# Patient Record
Sex: Female | Born: 1935 | Race: White | Hispanic: No | Smoking: Never smoker
Health system: Southern US, Community
[De-identification: ages and names within clinical notes are randomized; demographics above are authoritative.]

## PROBLEM LIST (undated history)

## (undated) DIAGNOSIS — J189 Pneumonia, unspecified organism: Secondary | ICD-10-CM

## (undated) DIAGNOSIS — J449 Chronic obstructive pulmonary disease, unspecified: Secondary | ICD-10-CM

## (undated) DIAGNOSIS — C50919 Malignant neoplasm of unspecified site of unspecified female breast: Secondary | ICD-10-CM

## (undated) DIAGNOSIS — Z923 Personal history of irradiation: Secondary | ICD-10-CM

## (undated) DIAGNOSIS — C801 Malignant (primary) neoplasm, unspecified: Secondary | ICD-10-CM

## (undated) DIAGNOSIS — I7090 Unspecified atherosclerosis: Secondary | ICD-10-CM

## (undated) DIAGNOSIS — Z9289 Personal history of other medical treatment: Secondary | ICD-10-CM

## (undated) DIAGNOSIS — Z9889 Other specified postprocedural states: Secondary | ICD-10-CM

## (undated) DIAGNOSIS — I38 Endocarditis, valve unspecified: Secondary | ICD-10-CM

## (undated) DIAGNOSIS — M1990 Unspecified osteoarthritis, unspecified site: Secondary | ICD-10-CM

## (undated) HISTORY — DX: Endocarditis, valve unspecified: I38

## (undated) HISTORY — DX: Malignant (primary) neoplasm, unspecified: C80.1

## (undated) HISTORY — DX: Personal history of other medical treatment: Z92.89

## (undated) HISTORY — DX: Unspecified atherosclerosis: I70.90

## (undated) HISTORY — DX: Other specified postprocedural states: Z98.890

## (undated) HISTORY — PX: BREAST BIOPSY: SHX20

## (undated) HISTORY — DX: Chronic obstructive pulmonary disease, unspecified: J44.9

## (undated) HISTORY — PX: COLONOSCOPY: SHX174

## (undated) HISTORY — DX: Pneumonia, unspecified organism: J18.9

## (undated) HISTORY — DX: Unspecified osteoarthritis, unspecified site: M19.90

---

## 1972-02-23 HISTORY — PX: CHOLECYSTECTOMY: SHX55

## 1991-05-24 DIAGNOSIS — Z923 Personal history of irradiation: Secondary | ICD-10-CM

## 1991-05-24 HISTORY — PX: BREAST LUMPECTOMY: SHX2

## 1991-05-24 HISTORY — DX: Personal history of irradiation: Z92.3

## 1991-11-11 DIAGNOSIS — C801 Malignant (primary) neoplasm, unspecified: Secondary | ICD-10-CM

## 1991-11-11 HISTORY — PX: BREAST SURGERY: SHX581

## 1991-11-11 HISTORY — DX: Malignant (primary) neoplasm, unspecified: C80.1

## 2004-05-12 ENCOUNTER — Ambulatory Visit: Payer: Self-pay | Admitting: Internal Medicine

## 2004-05-31 ENCOUNTER — Ambulatory Visit: Payer: Self-pay | Admitting: Internal Medicine

## 2004-08-11 ENCOUNTER — Emergency Department: Payer: Self-pay | Admitting: General Practice

## 2004-08-30 ENCOUNTER — Ambulatory Visit: Payer: Self-pay | Admitting: Internal Medicine

## 2004-12-10 ENCOUNTER — Ambulatory Visit: Payer: Self-pay | Admitting: Internal Medicine

## 2005-06-01 ENCOUNTER — Ambulatory Visit: Payer: Self-pay | Admitting: Internal Medicine

## 2005-10-03 ENCOUNTER — Ambulatory Visit: Payer: Self-pay | Admitting: Unknown Physician Specialty

## 2006-04-28 ENCOUNTER — Ambulatory Visit: Payer: Self-pay | Admitting: Internal Medicine

## 2006-06-20 ENCOUNTER — Ambulatory Visit: Payer: Self-pay | Admitting: Internal Medicine

## 2006-07-14 ENCOUNTER — Emergency Department: Payer: Self-pay | Admitting: Internal Medicine

## 2006-07-14 ENCOUNTER — Other Ambulatory Visit: Payer: Self-pay

## 2006-11-02 ENCOUNTER — Ambulatory Visit: Payer: Self-pay | Admitting: Ophthalmology

## 2006-11-07 ENCOUNTER — Ambulatory Visit: Payer: Self-pay | Admitting: Ophthalmology

## 2006-12-20 ENCOUNTER — Ambulatory Visit: Payer: Self-pay | Admitting: Unknown Physician Specialty

## 2007-07-09 ENCOUNTER — Ambulatory Visit: Payer: Self-pay | Admitting: Internal Medicine

## 2008-01-08 ENCOUNTER — Other Ambulatory Visit: Payer: Self-pay

## 2008-01-08 ENCOUNTER — Ambulatory Visit: Payer: Self-pay | Admitting: Ophthalmology

## 2008-01-22 ENCOUNTER — Ambulatory Visit: Payer: Self-pay | Admitting: Ophthalmology

## 2008-07-10 ENCOUNTER — Ambulatory Visit: Payer: Self-pay | Admitting: Internal Medicine

## 2009-07-14 ENCOUNTER — Ambulatory Visit: Payer: Self-pay | Admitting: Internal Medicine

## 2009-11-11 ENCOUNTER — Ambulatory Visit: Payer: Self-pay | Admitting: Unknown Physician Specialty

## 2010-05-22 ENCOUNTER — Emergency Department: Payer: Self-pay | Admitting: Emergency Medicine

## 2010-05-23 DIAGNOSIS — Z9889 Other specified postprocedural states: Secondary | ICD-10-CM

## 2010-05-23 HISTORY — DX: Other specified postprocedural states: Z98.890

## 2010-07-15 ENCOUNTER — Ambulatory Visit: Payer: Self-pay | Admitting: Internal Medicine

## 2011-07-25 ENCOUNTER — Ambulatory Visit: Payer: Self-pay | Admitting: Family Medicine

## 2012-02-23 ENCOUNTER — Ambulatory Visit: Payer: Self-pay | Admitting: Orthopedic Surgery

## 2012-02-27 ENCOUNTER — Ambulatory Visit: Payer: Self-pay | Admitting: Orthopedic Surgery

## 2012-02-27 DIAGNOSIS — I10 Essential (primary) hypertension: Secondary | ICD-10-CM

## 2012-02-27 LAB — BASIC METABOLIC PANEL
Anion Gap: 11 (ref 7–16)
BUN: 17 mg/dL (ref 7–18)
Calcium, Total: 8.9 mg/dL (ref 8.5–10.1)
Chloride: 95 mmol/L — ABNORMAL LOW (ref 98–107)
Co2: 27 mmol/L (ref 21–32)
EGFR (Non-African Amer.): 34 — ABNORMAL LOW
Glucose: 122 mg/dL — ABNORMAL HIGH (ref 65–99)
Potassium: 3.4 mmol/L — ABNORMAL LOW (ref 3.5–5.1)

## 2012-03-01 ENCOUNTER — Ambulatory Visit: Payer: Self-pay | Admitting: Orthopedic Surgery

## 2012-03-07 LAB — PATHOLOGY REPORT

## 2012-07-26 ENCOUNTER — Ambulatory Visit: Payer: Self-pay | Admitting: Family Medicine

## 2012-08-06 ENCOUNTER — Ambulatory Visit: Payer: Self-pay | Admitting: Family Medicine

## 2012-11-12 ENCOUNTER — Inpatient Hospital Stay: Payer: Self-pay | Admitting: Family Medicine

## 2012-11-12 LAB — URINALYSIS, COMPLETE
Glucose,UR: NEGATIVE mg/dL (ref 0–75)
Nitrite: NEGATIVE
Ph: 6 (ref 4.5–8.0)
RBC,UR: 9 /HPF (ref 0–5)
Specific Gravity: 1.026 (ref 1.003–1.030)
Squamous Epithelial: 5

## 2012-11-12 LAB — CBC WITH DIFFERENTIAL/PLATELET
Basophil #: 0.1 10*3/uL (ref 0.0–0.1)
HCT: 34.2 % — ABNORMAL LOW (ref 35.0–47.0)
Lymphocyte #: 1.3 10*3/uL (ref 1.0–3.6)
Lymphocyte %: 7.5 %
MCHC: 33.3 g/dL (ref 32.0–36.0)
MCV: 85 fL (ref 80–100)
Monocyte %: 14.4 %
Platelet: 338 10*3/uL (ref 150–440)
RBC: 4.01 10*6/uL (ref 3.80–5.20)

## 2012-11-12 LAB — COMPREHENSIVE METABOLIC PANEL
Albumin: 3.2 g/dL — ABNORMAL LOW (ref 3.4–5.0)
Anion Gap: 9 (ref 7–16)
Bilirubin,Total: 0.7 mg/dL (ref 0.2–1.0)
Calcium, Total: 9.2 mg/dL (ref 8.5–10.1)
Creatinine: 1.01 mg/dL (ref 0.60–1.30)
Glucose: 116 mg/dL — ABNORMAL HIGH (ref 65–99)
Osmolality: 261 (ref 275–301)
Potassium: 3.7 mmol/L (ref 3.5–5.1)
SGOT(AST): 13 U/L — ABNORMAL LOW (ref 15–37)
Sodium: 128 mmol/L — ABNORMAL LOW (ref 136–145)
Total Protein: 8.3 g/dL — ABNORMAL HIGH (ref 6.4–8.2)

## 2012-11-13 LAB — CBC WITH DIFFERENTIAL/PLATELET
Bands: 2 %
HCT: 28.8 % — ABNORMAL LOW (ref 35.0–47.0)
HGB: 10 g/dL — ABNORMAL LOW (ref 12.0–16.0)
MCH: 29.5 pg (ref 26.0–34.0)
Platelet: 285 10*3/uL (ref 150–440)
RDW: 17 % — ABNORMAL HIGH (ref 11.5–14.5)
Segmented Neutrophils: 75 %
WBC: 16.6 10*3/uL — ABNORMAL HIGH (ref 3.6–11.0)

## 2012-11-13 LAB — BASIC METABOLIC PANEL
Calcium, Total: 8.2 mg/dL — ABNORMAL LOW (ref 8.5–10.1)
Chloride: 93 mmol/L — ABNORMAL LOW (ref 98–107)
Osmolality: 260 (ref 275–301)

## 2012-11-13 LAB — MAGNESIUM: Magnesium: 1.4 mg/dL — ABNORMAL LOW

## 2012-11-14 LAB — CBC WITH DIFFERENTIAL/PLATELET
Basophil #: 0.1 10*3/uL (ref 0.0–0.1)
Basophil %: 0.5 %
Eosinophil %: 1.4 %
HCT: 28.2 % — ABNORMAL LOW (ref 35.0–47.0)
HGB: 9.6 g/dL — ABNORMAL LOW (ref 12.0–16.0)
Lymphocyte #: 1.1 10*3/uL (ref 1.0–3.6)
MCH: 28.8 pg (ref 26.0–34.0)
MCV: 85 fL (ref 80–100)
Neutrophil #: 10.8 10*3/uL — ABNORMAL HIGH (ref 1.4–6.5)
Neutrophil %: 74.7 %
Platelet: 293 10*3/uL (ref 150–440)
RDW: 17.3 % — ABNORMAL HIGH (ref 11.5–14.5)
WBC: 14.5 10*3/uL — ABNORMAL HIGH (ref 3.6–11.0)

## 2012-11-14 LAB — BASIC METABOLIC PANEL
BUN: 9 mg/dL (ref 7–18)
Calcium, Total: 8.2 mg/dL — ABNORMAL LOW (ref 8.5–10.1)
Co2: 26 mmol/L (ref 21–32)
Creatinine: 0.71 mg/dL (ref 0.60–1.30)
EGFR (Non-African Amer.): >60
Glucose: 102 mg/dL — ABNORMAL HIGH (ref 65–99)
Osmolality: 258 (ref 275–301)
Potassium: 3.6 mmol/L (ref 3.5–5.1)
Sodium: 129 mmol/L — ABNORMAL LOW (ref 136–145)

## 2012-11-15 LAB — CBC WITH DIFFERENTIAL/PLATELET
Basophil #: 0.1 10*3/uL (ref 0.0–0.1)
Basophil %: 0.4 %
Eosinophil #: 0.1 10*3/uL (ref 0.0–0.7)
Eosinophil %: 0.9 %
HGB: 9.5 g/dL — ABNORMAL LOW (ref 12.0–16.0)
Lymphocyte #: 1 10*3/uL (ref 1.0–3.6)
Lymphocyte %: 7.3 %
MCH: 28.9 pg (ref 26.0–34.0)
MCV: 84 fL (ref 80–100)
Monocyte #: 1.9 x10 3/mm — ABNORMAL HIGH (ref 0.2–0.9)
Neutrophil #: 11.1 10*3/uL — ABNORMAL HIGH (ref 1.4–6.5)
RDW: 16.9 % — ABNORMAL HIGH (ref 11.5–14.5)
WBC: 14.3 10*3/uL — ABNORMAL HIGH (ref 3.6–11.0)

## 2012-11-15 LAB — BASIC METABOLIC PANEL
Anion Gap: 5 — ABNORMAL LOW (ref 7–16)
BUN: 9 mg/dL (ref 7–18)
Co2: 30 mmol/L (ref 21–32)
Creatinine: 0.76 mg/dL (ref 0.60–1.30)
EGFR (African American): >60
Glucose: 115 mg/dL — ABNORMAL HIGH (ref 65–99)
Osmolality: 260 (ref 275–301)
Potassium: 3.6 mmol/L (ref 3.5–5.1)
Sodium: 130 mmol/L — ABNORMAL LOW (ref 136–145)

## 2012-11-15 LAB — EXPECTORATED SPUTUM ASSESSMENT W GRAM STAIN, RFLX TO RESP C

## 2012-11-16 LAB — CBC WITH DIFFERENTIAL/PLATELET
Basophil #: 0.1 10*3/uL (ref 0.0–0.1)
HCT: 27.9 % — ABNORMAL LOW (ref 35.0–47.0)
Lymphocyte #: 1 10*3/uL (ref 1.0–3.6)
MCV: 85 fL (ref 80–100)
Monocyte #: 1.8 x10 3/mm — ABNORMAL HIGH (ref 0.2–0.9)
Monocyte %: 14.9 %
Neutrophil #: 9 10*3/uL — ABNORMAL HIGH (ref 1.4–6.5)
Neutrophil %: 74.6 %
Platelet: 351 10*3/uL (ref 150–440)
RBC: 3.31 10*6/uL — ABNORMAL LOW (ref 3.80–5.20)
RDW: 17 % — ABNORMAL HIGH (ref 11.5–14.5)

## 2012-11-18 LAB — CULTURE, BLOOD (SINGLE)

## 2012-11-20 DIAGNOSIS — J189 Pneumonia, unspecified organism: Secondary | ICD-10-CM

## 2012-11-20 HISTORY — DX: Pneumonia, unspecified organism: J18.9

## 2013-07-30 ENCOUNTER — Ambulatory Visit: Payer: Self-pay | Admitting: Family Medicine

## 2013-08-28 DIAGNOSIS — I10 Essential (primary) hypertension: Secondary | ICD-10-CM | POA: Insufficient documentation

## 2013-08-28 DIAGNOSIS — E7800 Pure hypercholesterolemia, unspecified: Secondary | ICD-10-CM | POA: Insufficient documentation

## 2013-08-28 DIAGNOSIS — D649 Anemia, unspecified: Secondary | ICD-10-CM | POA: Insufficient documentation

## 2013-08-28 DIAGNOSIS — E785 Hyperlipidemia, unspecified: Secondary | ICD-10-CM | POA: Insufficient documentation

## 2013-08-28 DIAGNOSIS — D509 Iron deficiency anemia, unspecified: Secondary | ICD-10-CM | POA: Insufficient documentation

## 2013-08-28 DIAGNOSIS — D126 Benign neoplasm of colon, unspecified: Secondary | ICD-10-CM | POA: Insufficient documentation

## 2013-12-01 IMAGING — MG MM ADDITIONAL VIEWS AT NO CHARGE
1 series · 3 of 3 positions shown · non-contrast
Comparison: none

REASON FOR EXAM: av lt spiculated asymmetry rt nodular asymmetry
COMMENTS:

PROCEDURE:     MAM - MAM DGTL [HOSPITAL] ADD VIEW BIL DIAG  - August 06, 2012  [DATE]
RESULT:     TECHNIQUE: Digital diagnostic bilateral mammograms were
obtained. FDA approved computer-aided detection (CAD) for mammography was
utilized for this study.

[R MLO · right · 3 of 3 slices shown]
[im 1/3]
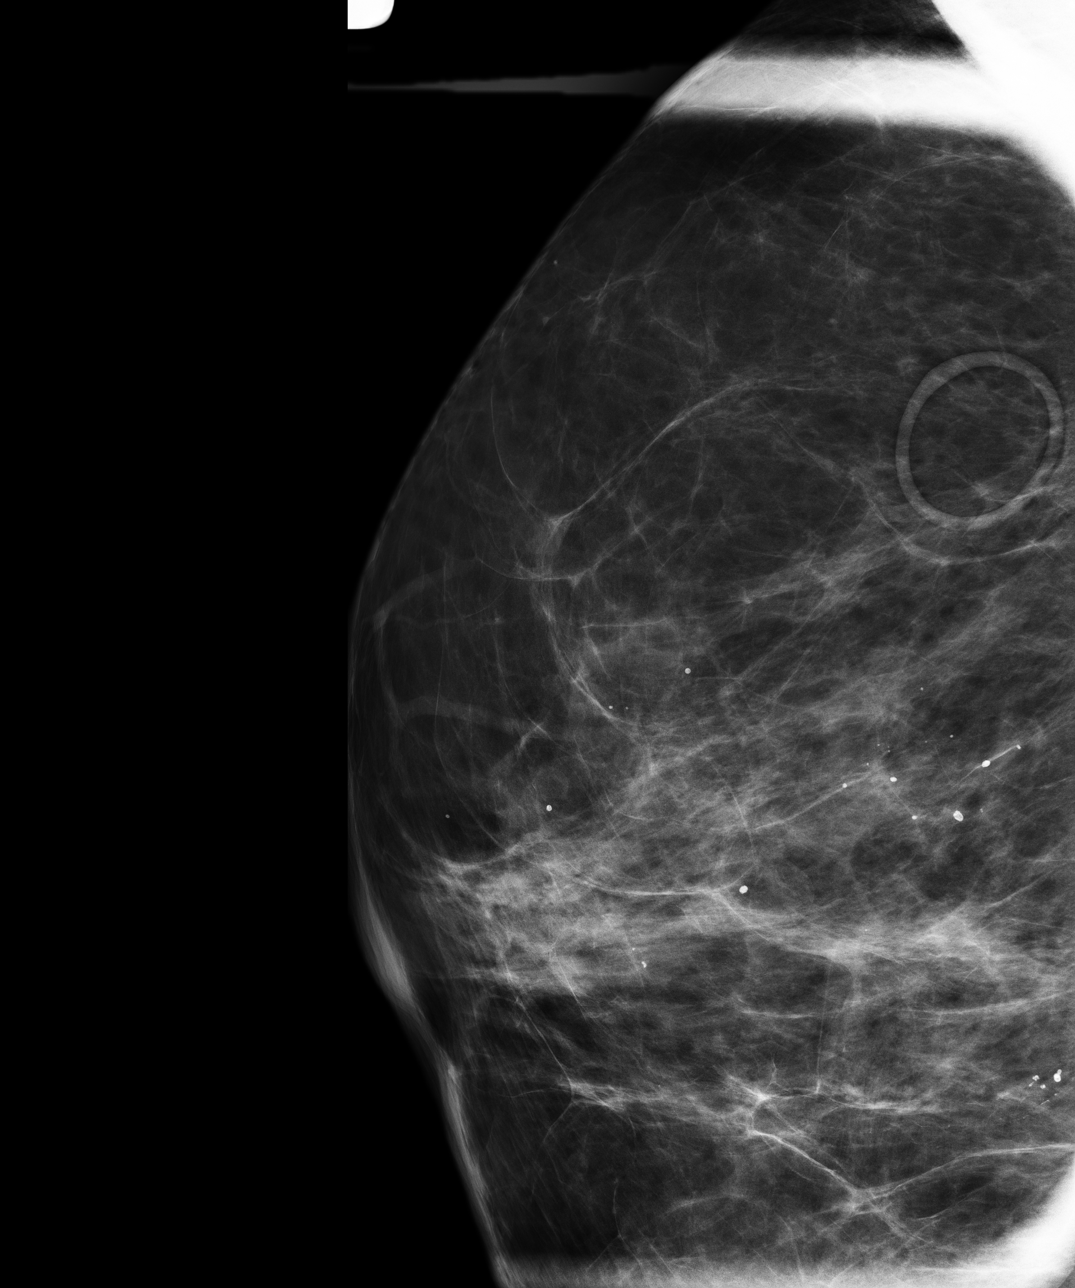
[im 2/3]
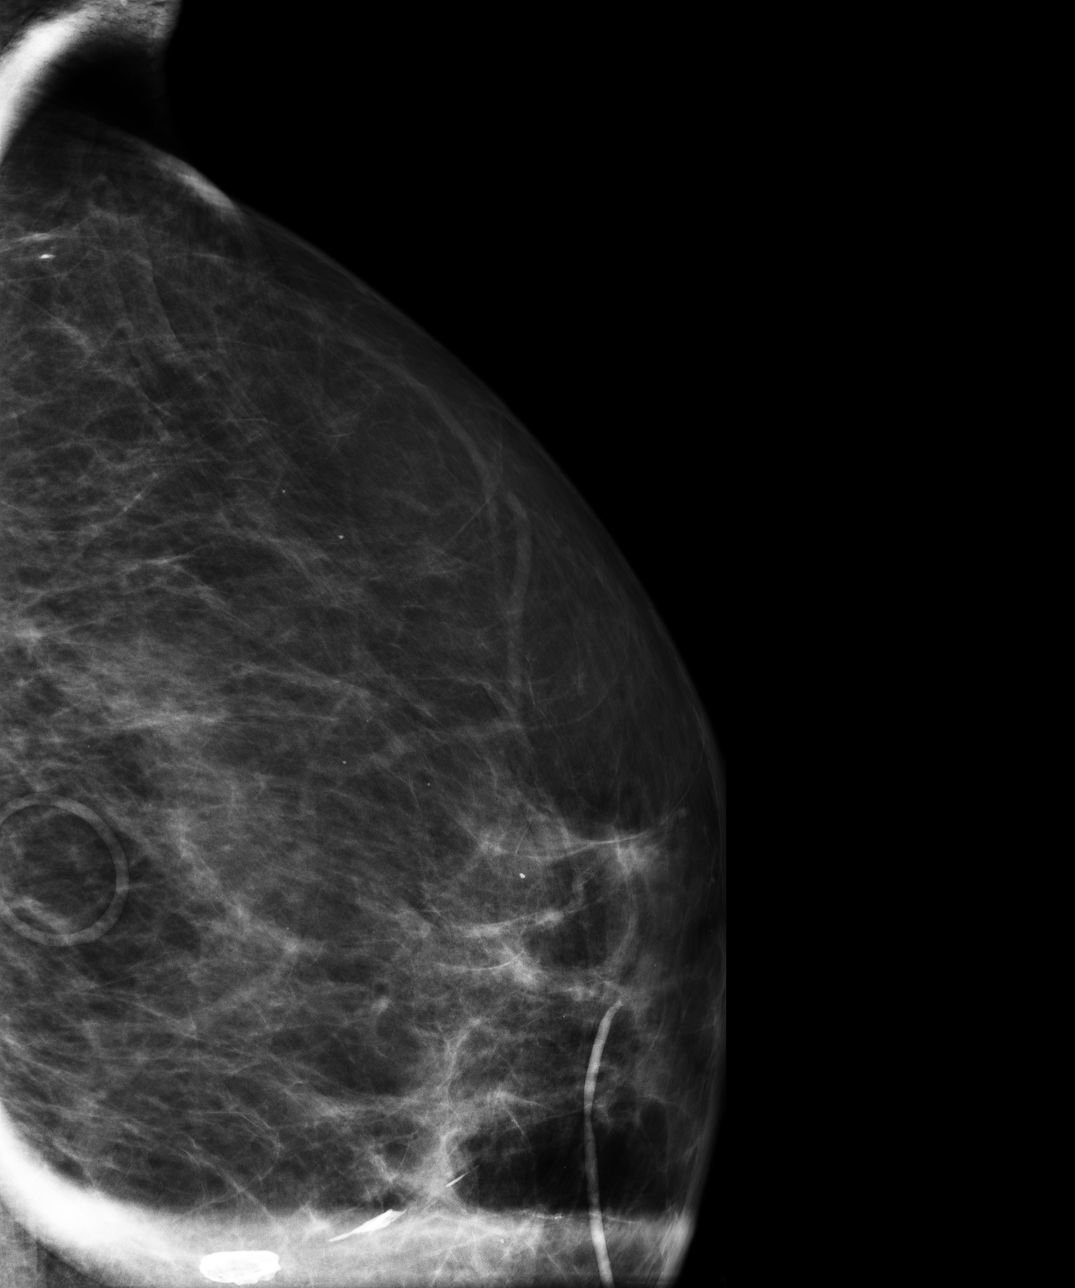
[im 3/3]
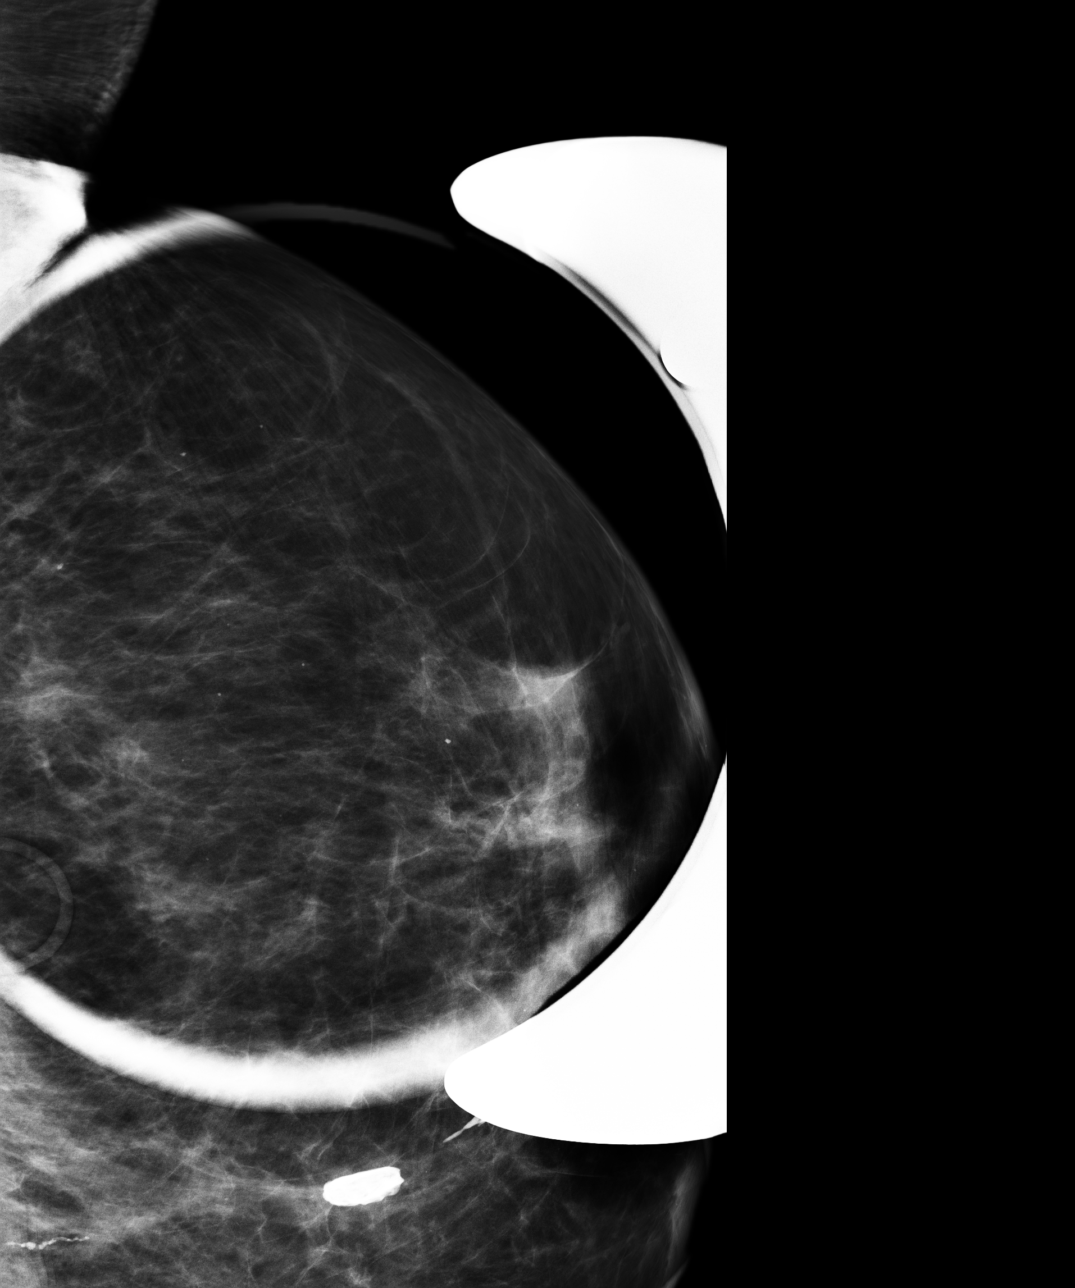

[3 of 3 positions shown; findings below may reference images not displayed]

FINDING: True lateral view and spot compression views of the right breast were
performed. The area of concern in the retroareolar right breast compresses
and blends in with the adjacent fibroglandular tissue without a persistent
mass or architectural distortion. There are no suspicious
microcalcifications.

True lateral view and spot compression views of the left breast were
performed. The area of concern in the left upper breast compresses and
blends in with the adjacent fibroglandular tissue without a persistent mass
or architectural distortion. There are no suspicious microcalcifications.
IMPRESSION: 1.     Negative diagnostic bilateral mammograms.
2.     Return to annual mammographic follow up recommended.

BI-RADS:  Category 2- Benign.

A negative mammogram report does not preclude biopsy or other evaluation of
a clinically palpable or otherwise suspicious mass or lesion. Breast cancer
may not be detected by mammography in up to 10% of cases.

[REDACTED]

## 2014-03-09 IMAGING — CR DG CHEST 2V
1 series · 2 of 2 positions shown · non-contrast
Comparison: none

REASON FOR EXAM: cough
COMMENTS:

PROCEDURE:     DXR - DXR CHEST PA (OR AP) AND LATERAL  - November 12, 2012  [DATE]
RESULT:     Comparison: 05/22/2010

[Series 1: w chest pa · 0.14mm/px · 2 of 2 slices shown]
[im 1/2]
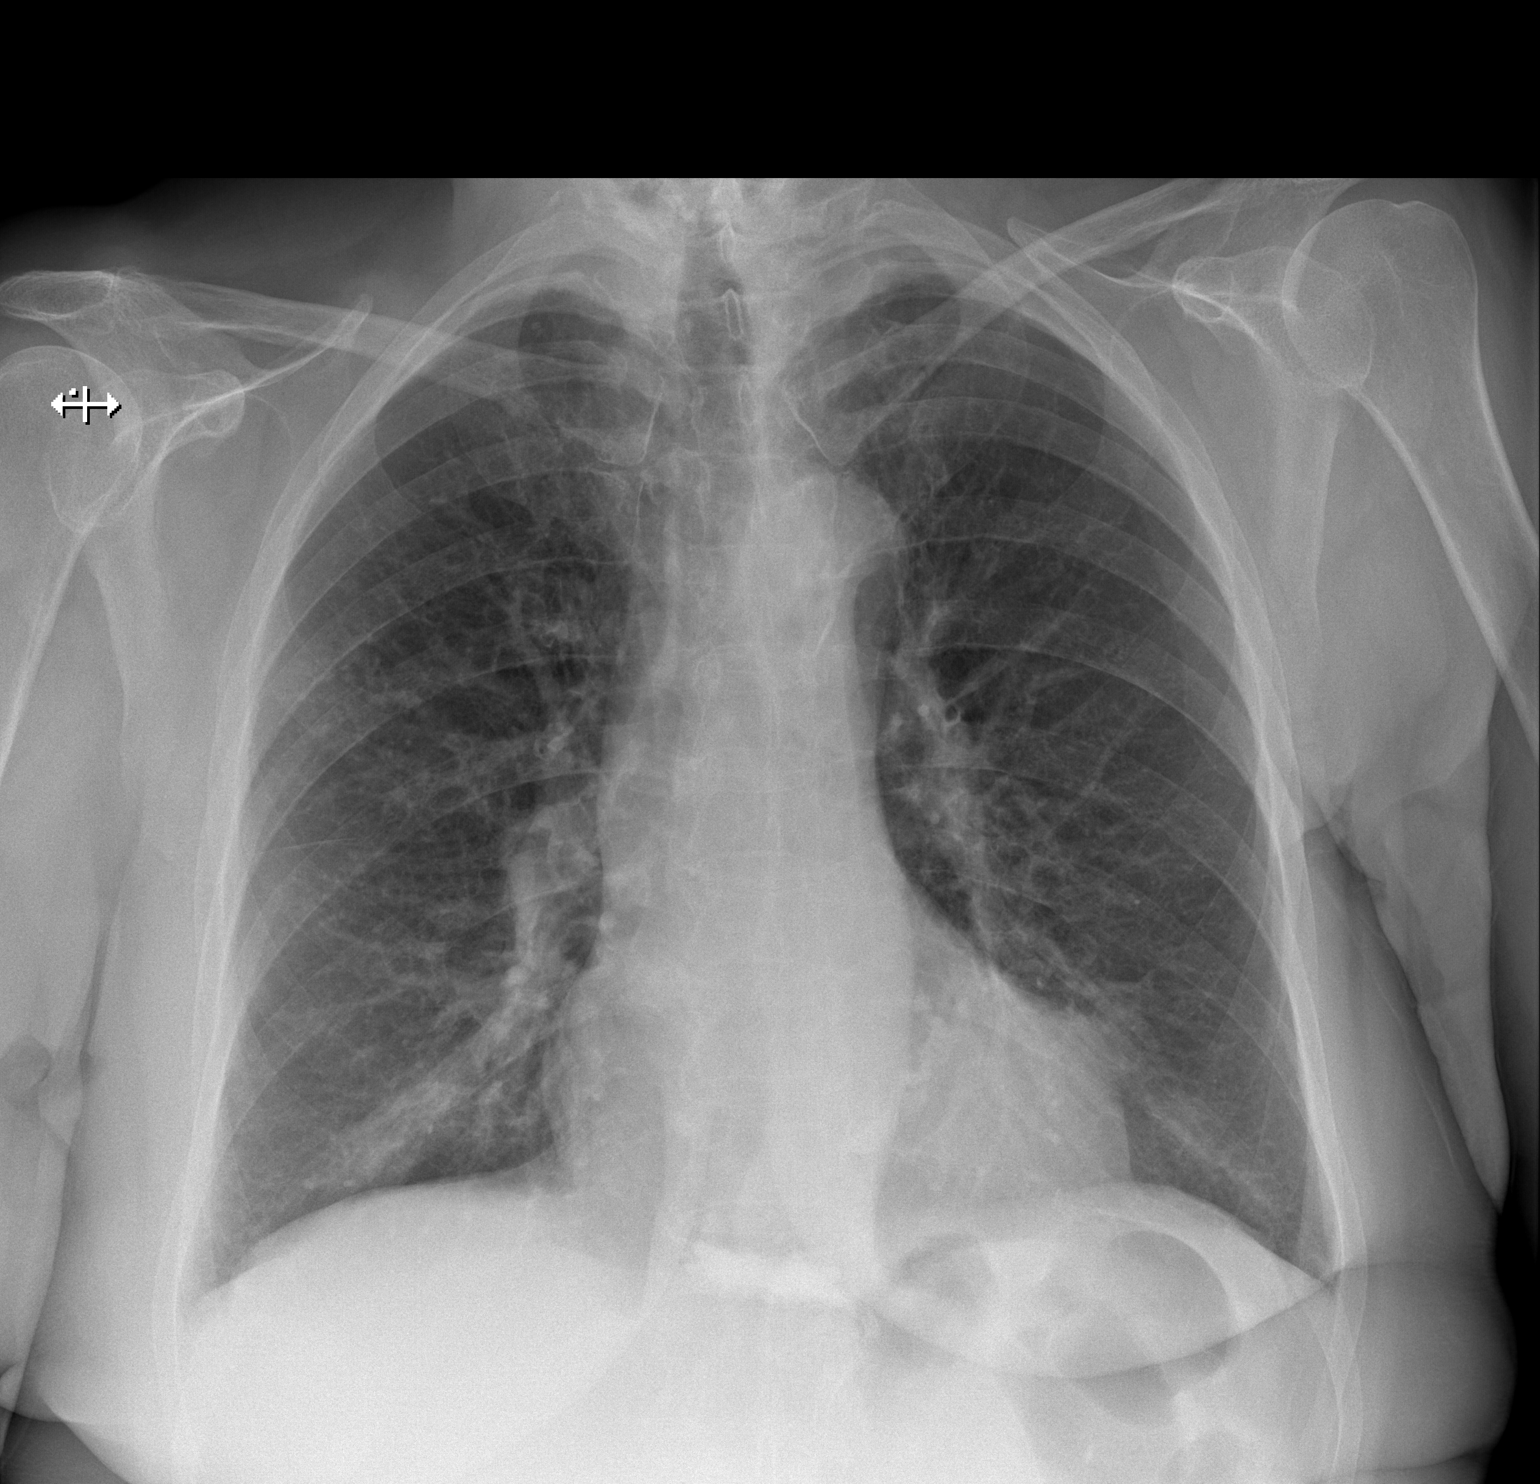
[im 2/2]
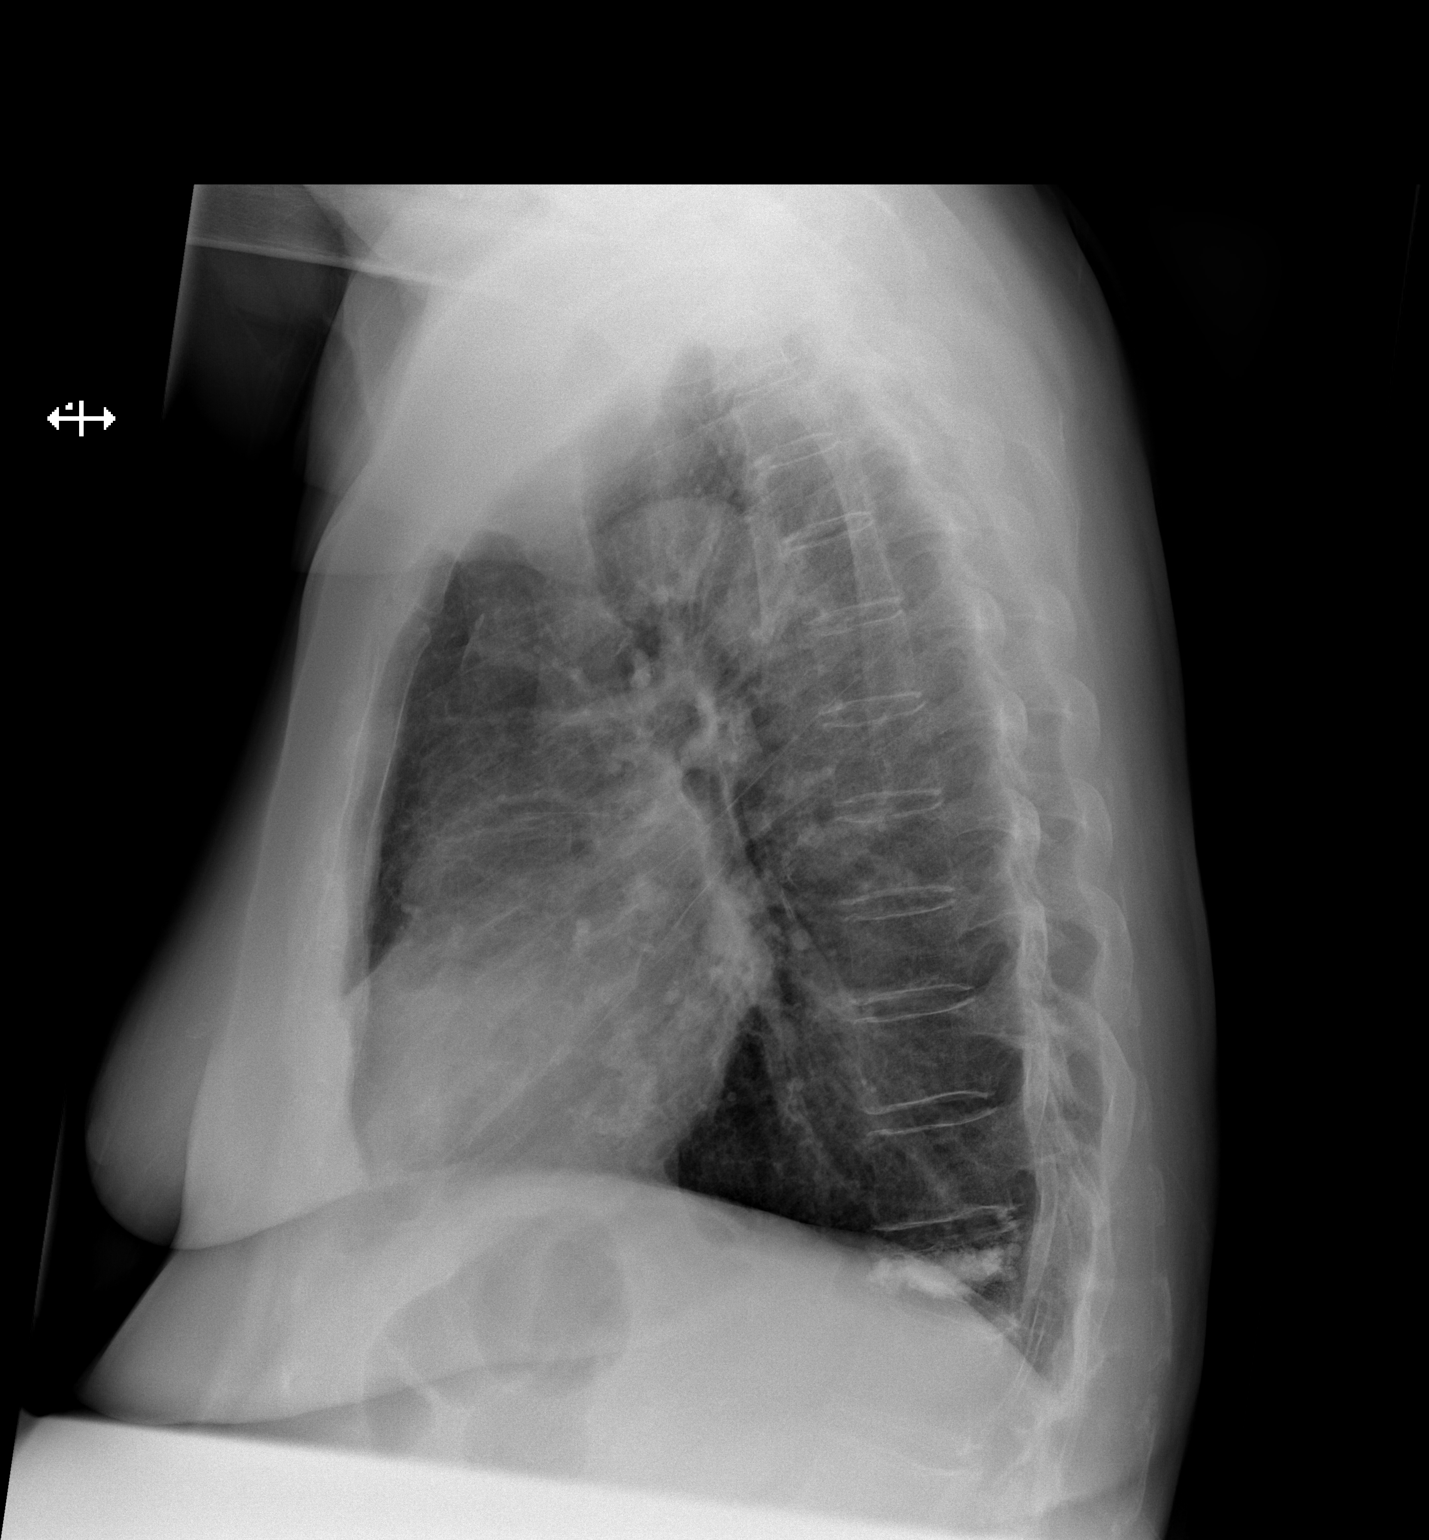

[2 of 2 positions shown; findings below may reference images not displayed]

FINDINGS: PA and lateral chest radiographs are provided. There is bilateral diffuse
interstitial thickening likely representing interstitial edema versus
interstitial pneumonitis secondary to an infectious or inflammatory
etiology. There is no focal parenchymal opacity, pleural effusion, or
pneumothorax. The heart and mediastinum are unremarkable. There is evidence
of prior lower thoracic spine kyphoplasty.
IMPRESSION: There is bilateral diffuse interstitial thickening likely representing
interstitial edema versus interstitial pneumonitis secondary to an
infectious or inflammatory etiology.

[REDACTED]

## 2014-03-11 IMAGING — CR DG CHEST 2V
1 series · 2 of 2 positions shown · non-contrast
Comparison: none

REASON FOR EXAM: fever, green sputum
COMMENTS:

PROCEDURE:     DXR - DXR CHEST PA (OR AP) AND LATERAL  - November 14, 2012  [DATE]
RESULT:     Comparison is made to prior study dated 11/12/2012.

[Series 3: x chest ap · 0.14mm/px · 2 of 2 slices shown]
[im 1/2]
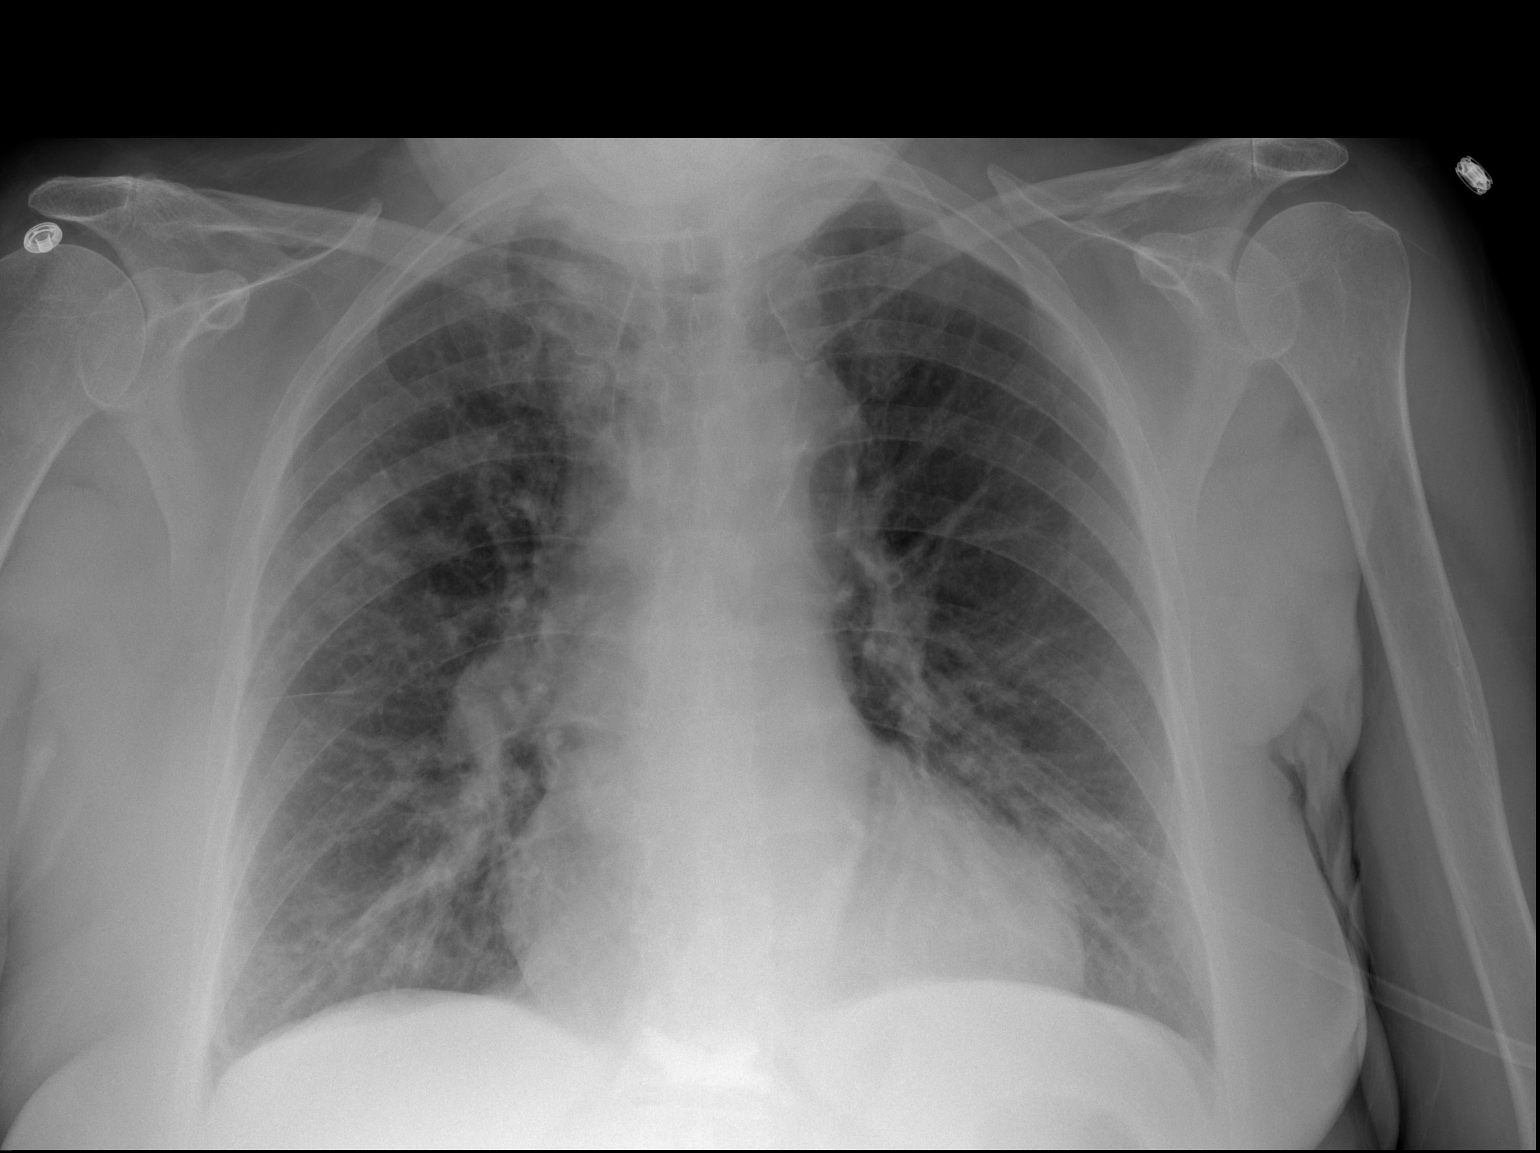
[im 2/2]
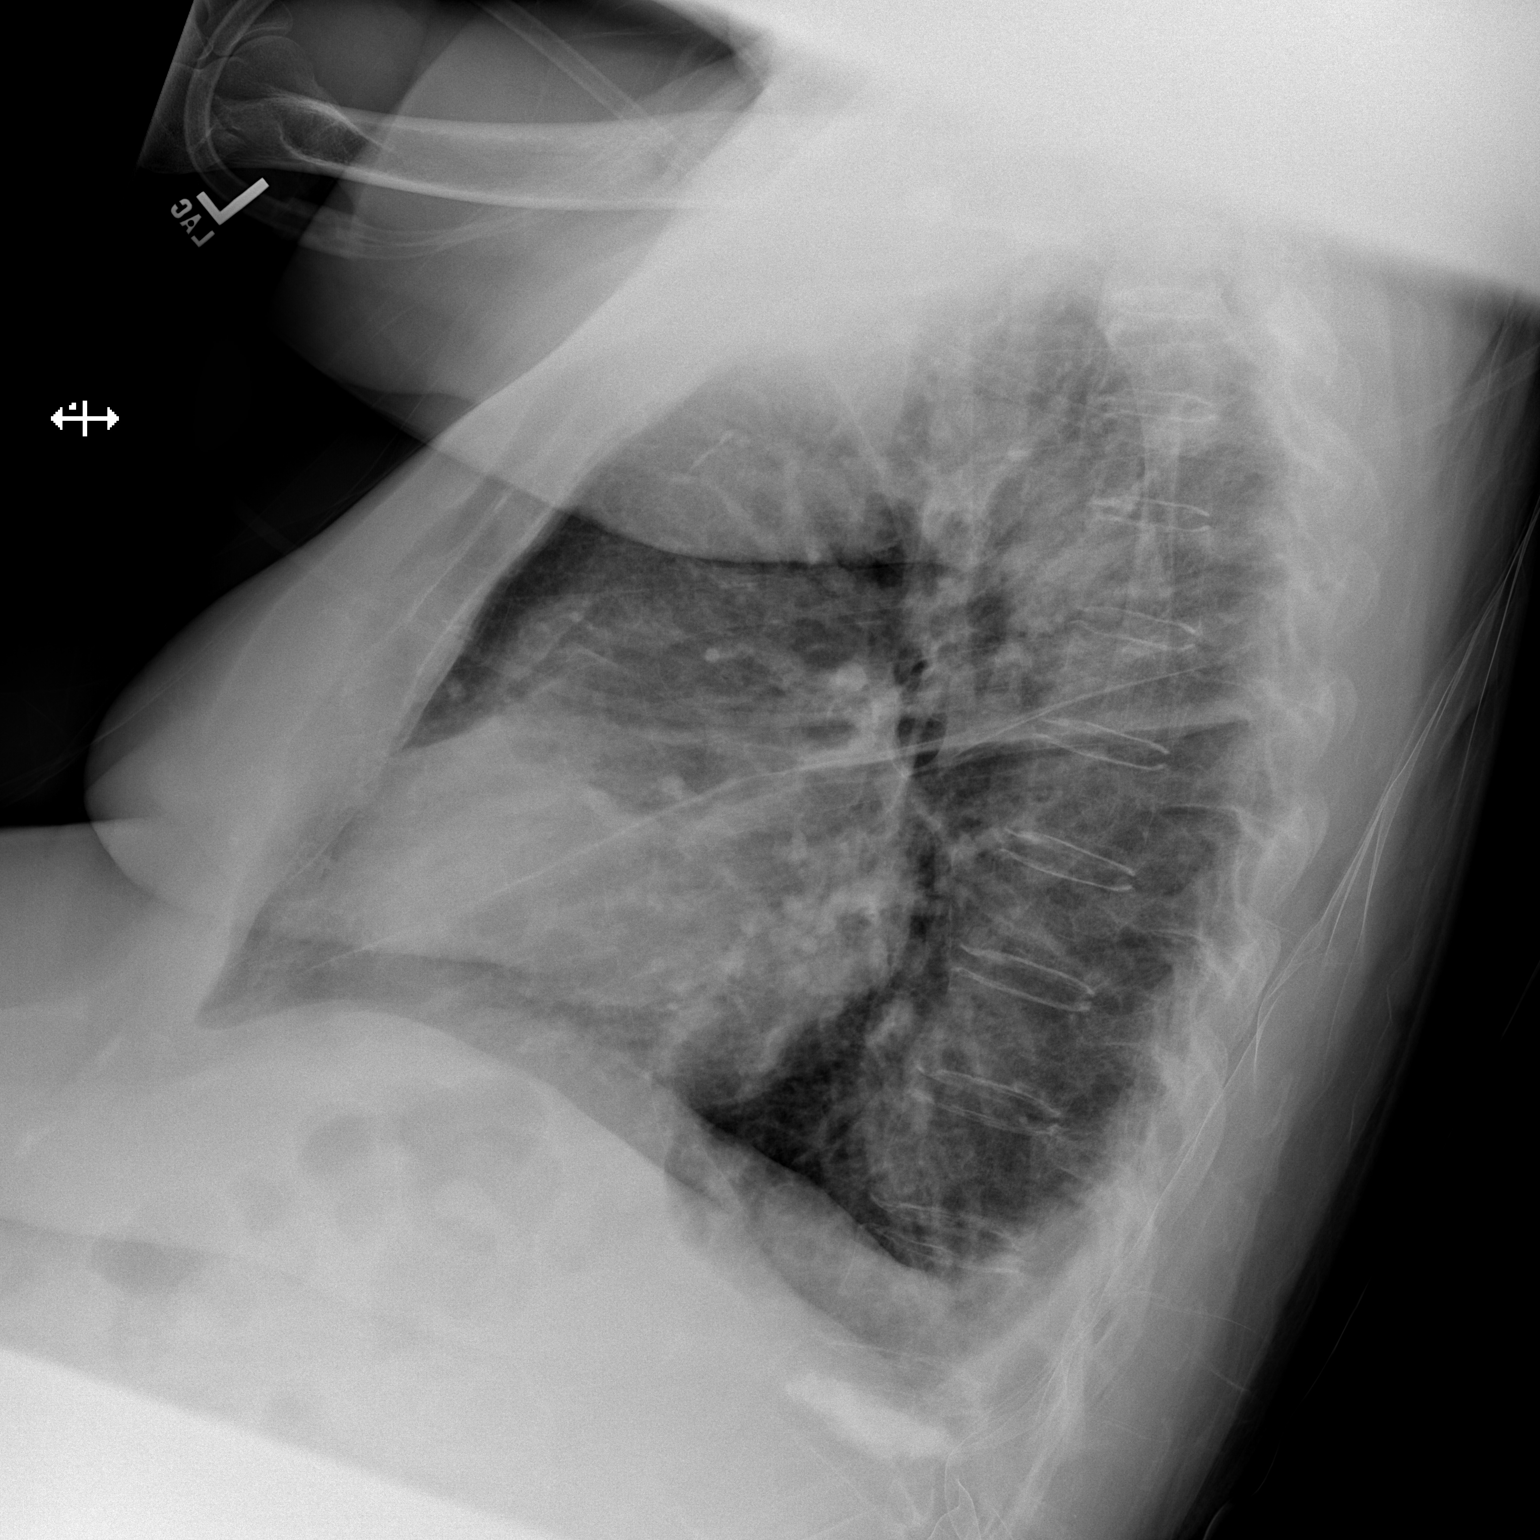

[2 of 2 positions shown; findings below may reference images not displayed]

FINDINGS: There is diffuse prominence and indistinctness of the interstitial
markings. No significant peribronchial cuffing is identified. Bilateral
pulmonary opacities, mild, are identified. The cardiac silhouette is within
normal limits. The visualized bony skeleton is unremarkable.
IMPRESSION: 1. Bilateral interstitial infiltrate likely representing an infectious or
inflammatory infiltrate. An underlying component of pulmonary edema is also
of diagnostic consideration if clinically appropriate. Surveillance
evaluation recommended status post appropriate therapeutic regimen.

## 2014-03-31 DIAGNOSIS — I350 Nonrheumatic aortic (valve) stenosis: Secondary | ICD-10-CM | POA: Insufficient documentation

## 2014-05-23 DIAGNOSIS — Z9289 Personal history of other medical treatment: Secondary | ICD-10-CM

## 2014-05-23 HISTORY — PX: BREAST LUMPECTOMY: SHX2

## 2014-05-23 HISTORY — DX: Personal history of other medical treatment: Z92.89

## 2014-08-04 ENCOUNTER — Ambulatory Visit: Payer: Self-pay | Admitting: Family Medicine

## 2014-08-07 ENCOUNTER — Ambulatory Visit: Payer: Self-pay | Admitting: Family Medicine

## 2014-08-12 ENCOUNTER — Ambulatory Visit: Payer: Self-pay | Admitting: Family Medicine

## 2014-08-18 ENCOUNTER — Ambulatory Visit
Admit: 2014-08-18 | Disposition: A | Discharge status: Home / Self Care | Payer: Self-pay | Attending: Oncology | Admitting: Oncology

## 2014-08-19 ENCOUNTER — Ambulatory Visit
Admit: 2014-08-19 | Disposition: A | Discharge status: Home / Self Care | Payer: Self-pay | Attending: Oncology | Admitting: Oncology

## 2014-08-20 ENCOUNTER — Encounter: Payer: Self-pay | Admitting: *Deleted

## 2014-08-22 ENCOUNTER — Ambulatory Visit
Admit: 2014-08-22 | Disposition: A | Discharge status: Home / Self Care | Payer: Self-pay | Attending: Oncology | Admitting: Oncology

## 2014-08-27 ENCOUNTER — Ambulatory Visit (INDEPENDENT_AMBULATORY_CARE_PROVIDER_SITE_OTHER): Payer: Medicare Other | Admitting: General Surgery

## 2014-08-27 ENCOUNTER — Encounter: Payer: Self-pay | Admitting: General Surgery

## 2014-08-27 DIAGNOSIS — C50211 Malignant neoplasm of upper-inner quadrant of right female breast: Secondary | ICD-10-CM | POA: Diagnosis not present

## 2014-08-27 DIAGNOSIS — Z853 Personal history of malignant neoplasm of breast: Secondary | ICD-10-CM

## 2014-08-27 NOTE — Patient Instructions (Signed)
The patient is aware to call back for any questions or concerns.  

## 2014-08-27 NOTE — Progress Notes (Signed)
Patient ID: Kristy Hardy, female   DOB: 04/08/1925, 81 y.o.   MRN: [id number]  Chief Complaint  Patient presents with  . Other    breast cancer    HPI Kristy Hardy is a 81 y.o. female who presents for a breast evaluation. The most recent mammogram was done on 08/04/14 and added views 08/07/14 and right breast biopsy done on 08/14/14. Her most recent biopsy showed breast cancer. Patient does perform regular self breast checks and gets regular mammograms done.  She could not feel anything different in the breast. She saw Dr Oliva Bustard last week. She is here today with her daughter, Kristy Hardy.  HPI  Past Medical History  Diagnosis Date  . Arthritis   . Pneumonia July 2014  . COPD (chronic obstructive pulmonary disease)   . Heart valve problem     leaking  . Cancer 11-11-91    left breast   . Cancer 08-12-14    right breast, ER/PR positive, Her2 negative    Past Surgical History  Procedure Laterality Date  . Colonoscopy    . Cholecystectomy  02/23/1972  . Breast surgery Left 11-11-91    lumpectomy with radiation, Tamoxifen for 2 years.Dr. Sharlet Salina    Family History  Problem Relation Age of Onset  . Colon cancer Mother 67  . Colon cancer Brother 22  . Breast cancer Sister 76    Social History History  Substance Use Topics  . Smoking status: Never Smoker   . Smokeless tobacco: Never Used  . Alcohol Use: No    No Known Allergies  Current Outpatient Prescriptions  Medication Sig Dispense Refill  . acetaminophen (TYLENOL) 650 MG CR tablet Take by mouth.    . Albuterol Sulfate (PROAIR HFA IN) Inhale into the lungs as needed.    Marland Kitchen amLODipine (NORVASC) 5 MG tablet Take 5 mg by mouth daily.     Marland Kitchen aspirin 81 MG tablet Take 81 mg by mouth daily.    . fluticasone (FLONASE) 50 MCG/ACT nasal spray Place 1 spray into both nostrils daily.   11  . losartan-hydrochlorothiazide (HYZAAR) 50-12.5 MG per tablet Take 1 tablet by mouth daily.     . Magnesium 250 MG  TABS Take by mouth daily.    . metoprolol succinate (TOPROL-XL) 50 MG 24 hr tablet     . omeprazole (PRILOSEC) 40 MG capsule Take 40 mg by mouth daily.    . simvastatin (ZOCOR) 20 MG tablet Take 20 mg by mouth daily at 6 PM.      No current facility-administered medications for this visit.    Review of Systems Review of Systems  Constitutional: Negative.   Respiratory: Negative.   Cardiovascular: Negative.     Blood pressure 130/70, height '5\' 5"'  (1.651 m), weight 180 lb (81.647 kg).  Physical Exam Physical Exam  Constitutional: She is oriented to person, place, and time. She appears well-developed and well-nourished.  Eyes: Conjunctivae are normal. No scleral icterus.  Neck: Neck supple.  Cardiovascular: Normal rate and regular rhythm.   Murmur heard.  Diastolic murmur is present with a grade of 3/6  Pulmonary/Chest: Effort normal and breath sounds normal. Right breast exhibits no inverted nipple, no mass, no nipple discharge, no skin change and no tenderness. Left breast exhibits skin change. Left breast exhibits no inverted nipple, no mass, no nipple discharge and no tenderness.    minimal ecchymosis right breat upper inner quadrant from recent biopsy. Left lumpectomy site well healed with minimal radiation changes. Healed incision  left axilla.  Abdominal: Soft.  Lymphadenopathy:    She has no cervical adenopathy.    She has no axillary adenopathy.  Neurological: She is alert and oriented to person, place, and time.  Skin: Skin is warm and dry.    Data Reviewed Mammogram and path reviewed  Assessment    Right upper inner quadrant breast cancer. Low to intermediate grade, ER/PR pos, Her 2 neg     Plan    Right breast lumpectomy with SN biopsy. Discussed fully with pt and she is agreeable.     PCP:  [name] Dr. Eda Paschal 08/27/2014, 6:49 PM

## 2014-08-28 ENCOUNTER — Other Ambulatory Visit: Payer: Self-pay | Admitting: General Surgery

## 2014-08-28 DIAGNOSIS — C50211 Malignant neoplasm of upper-inner quadrant of right female breast: Secondary | ICD-10-CM

## 2014-09-01 ENCOUNTER — Encounter: Payer: Self-pay | Admitting: General Surgery

## 2014-09-01 ENCOUNTER — Ambulatory Visit
Admit: 2014-09-01 | Disposition: A | Discharge status: Home / Self Care | Payer: Self-pay | Attending: General Surgery | Admitting: General Surgery

## 2014-09-01 LAB — CBC WITH DIFFERENTIAL/PLATELET
Basophil #: 0.1 10*3/uL (ref 0.0–0.1)
Basophil %: 1.5 %
EOS ABS: 0.4 10*3/uL (ref 0.0–0.7)
Eosinophil %: 4 %
HCT: 38.8 % (ref 35.0–47.0)
HGB: 13.1 g/dL (ref 12.0–16.0)
Lymphocyte #: 2.2 10*3/uL (ref 1.0–3.6)
Lymphocyte %: 24.2 %
MCH: 31.2 pg (ref 26.0–34.0)
MCHC: 33.7 g/dL (ref 32.0–36.0)
MCV: 93 fL (ref 80–100)
Monocyte #: 1 x10 3/mm — ABNORMAL HIGH (ref 0.2–0.9)
Monocyte %: 11.1 %
NEUTROS ABS: 5.5 10*3/uL (ref 1.4–6.5)
Neutrophil %: 59.2 %
Platelet: 286 10*3/uL (ref 150–440)
RBC: 4.19 10*6/uL (ref 3.80–5.20)
RDW: 12.8 % (ref 11.5–14.5)
WBC: 9.3 10*3/uL (ref 3.6–11.0)

## 2014-09-01 LAB — COMPREHENSIVE METABOLIC PANEL
ALBUMIN: 4.2 g/dL
ANION GAP: 7 (ref 7–16)
AST: 29 U/L
Alkaline Phosphatase: 65 U/L
BUN: 19 mg/dL
Bilirubin,Total: 0.5 mg/dL
CREATININE: 0.91 mg/dL
Calcium, Total: 9.5 mg/dL
Chloride: 98 mmol/L — ABNORMAL LOW
Co2: 30 mmol/L
EGFR (African American): >60
EGFR (Non-African Amer.): 57 — ABNORMAL LOW
GLUCOSE: 114 mg/dL — AB
Potassium: 4.1 mmol/L
SGPT (ALT): 19 U/L
Sodium: 135 mmol/L
TOTAL PROTEIN: 7.8 g/dL

## 2014-09-02 ENCOUNTER — Encounter: Payer: Self-pay | Admitting: General Surgery

## 2014-09-02 LAB — CANCER ANTIGEN 27.29: CA 27.29: 22.6 U/mL (ref 0.0–38.6)

## 2014-09-02 LAB — CEA: CEA: 1.6 ng/mL (ref 0.0–4.7)

## 2014-09-09 ENCOUNTER — Ambulatory Visit
Admit: 2014-09-09 | Disposition: A | Discharge status: Home / Self Care | Payer: Self-pay | Attending: General Surgery | Admitting: General Surgery

## 2014-09-09 ENCOUNTER — Encounter: Payer: Self-pay | Admitting: General Surgery

## 2014-09-09 DIAGNOSIS — C50211 Malignant neoplasm of upper-inner quadrant of right female breast: Secondary | ICD-10-CM

## 2014-09-09 HISTORY — PX: BREAST SURGERY: SHX581

## 2014-09-09 NOTE — Op Note (Signed)
PATIENT NAME:  Kristy Hardy, Kristy Hardy MR#:  [id number] DATE OF BIRTH:  29-Oct-1924  DATE OF PROCEDURE:  03/01/2012  PREOPERATIVE DIAGNOSIS: T12 compression fracture.   POSTOPERATIVE DIAGNOSIS: T12 compression fracture.   PROCEDURE: T12 bone biopsy and kyphoplasty.   ANESTHESIA: MAC.  SURGEON: Laurene Footman, MD  DESCRIPTION OF PROCEDURE: Patient was brought to the Operating Room and placed in a prone position. C-arm was brought in and good visualization of the T12 compressed vertebrae was obtained. After prepping the skin and appropriate patient identification and timeout procedures 5 mL of 1% Xylocaine was infiltrated on either side of the T12 vertebral body for skin local anesthetic. Next, the back was prepped and draped in the usual sterile manner and a stab incision was made first on the right and subsequently on the left. On the right side a trocar was placed through the pedicle into the vertebral body and a bone biopsy obtained with good bone biopsy material present. A drill was then used to get deep and a balloon from Kyphon kyphoplasty set inserted. A similar procedure was then carried out on the left side; stab incision, placement of the trocar being certain to be in a safe position through the pedicle avoiding nerve roots and the spinal canal. After both trocars were placed the balloon catheters were inserted. Approximately 2.5 mL of contrast material used to inflate the vertebral body. The right side was released and the right side then filled with bone cement with 1.5 mL. The left side was then removed and 1.5 mL inserted. Care was taken during introduction of the bone cement with a total of approximately 4.5 mL bone cement placed. This appeared to be contained within the vertebral body. After the cement had adequately set trocars were removed. There did not appear to be any extravasation. The wounds were then closed with Dermabond and band aids applied.   ESTIMATED BLOOD LOSS: Minimal.    COMPLICATIONS: None.   SPECIMEN: Vertebral body T12 bone biopsy.   IMPLANTS: Kyphon bone cement with adequate fill of the T12 vertebral body.   COMPLICATIONS: There were no complications.   CONDITION: To recovery room stable.   ____________________________ Laurene Footman, MD mjm:cms D: 03/01/2012 23:05:39 ET T: 03/02/2012 10:17:51 ET JOB#: [id number]  cc: Laurene Footman, MD, <Dictator> Laurene Footman MD ELECTRONICALLY SIGNED 03/02/2012 15:17

## 2014-09-11 ENCOUNTER — Encounter: Payer: Self-pay | Admitting: General Surgery

## 2014-09-12 NOTE — Discharge Summary (Signed)
PATIENT NAME:  Kristy Hardy, Kristy Hardy MR#:  [id number] DATE OF BIRTH:  [birth year]  DATE OF ADMISSION:  11/12/2012 DATE OF DISCHARGE:  11/17/2012  REASON FOR ADMISSION:  Cough and sputum for four days.   DISCHARGE DIAGNOSES: 1.  Bilateral pneumonia.  2.  Acute respiratory failure.  3.  Urinary tract infection.  4.  Sepsis.  5.  Hyponatremia.  6.  Anemia of chronic disease.  7.  Hypomagnesemia.  8.  Hypertension, uncontrolled.   DISPOSITION:  Home with home health.   MEDICATIONS AT DISCHARGE:  Ibuprofen 200 mg every 6 hours as needed for pain, Tylenol as needed for pain, multivitamins once daily, hydrochlorothiazide/losartan 12.5/50 mg every morning, Tramadol 50 mg once a day, fish oil 1000 mg once daily, omeprazole 40 mg once daily, Nucynta 50 mg every 6 hours, metoprolol succinate 100 mg once a day, ferrous sulfate 325 mg once a day, simvastatin 20 mg once a day, levofloxacin 750 mg every 48 hours for 8 more days, codeine with guaifenesin 10/100 mg per 5 mL take 10 mL every 6 hours as needed for cough.   FOLLOW-UP:  With Dr. Bartholome Bill in 4 to 7 days for blood pressure management.  The patient does have an appointment to see Dr. Ubaldo Glassing within the next week, but I have instructed the family to make sure that they see him in that period of time.  Apparently the patient has also a stress test to be done within this month.  Follow up with Dr. Kary Kos in 1 to 2 weeks or at least within 7 days for monitoring of blood pressure as well and for monitoring of her pneumonia recovery and monitoring of physical therapy with home health.   IMPORTANT RESULTS:  Glucose 111, creatinine 1.01.  At discharge creatinine is actually 0.76.  Her AST, ALT were within normal limits.  Troponins was negative.  TSH 2.24.  Admission white count is 17,000, up to 27, white count is trending down to 12,000.  Hemoglobin is 11.4 on admission and 9.5 at discharge due to IV fluids and dilution.  Platelets 351.  Blood cultures were  negative.  Urinalysis grew Streptococcus agalactiae group B and other mixed organisms.  Sputum culture, gram-positive cocci in pairs, white count on urinalysis 112, positive leukocyte esterase, negative nitrites.   HOSPITAL COURSE:  This is a very nice 81 year old female with history of hypertension, uncontrolled, breast cancer in the past and osteoporosis, complaining of cough and sputum for four days.  The patient has been having uncontrolled hypertension for a while, for what Dr. Kary Kos referred to Dr. Ubaldo Glassing.  Dr. Ubaldo Glassing has already a plan to manage her blood pressure medication.  He has increased her metoprolol to 100 mg recently for what we have not made many other changes here in this hospitalization.  Overall, the patient came with a history of cough, shortness of breath.  She was hypoxic, requiring oxygen.  Her oxygen saturation was in the low 90s.  She was kept on oxygen 2 liters nasal cannula for 2 days.  We were able to wean her off and she had a very slow recovery.  1.  Pneumonia.  The patient has community-acquired pneumonia requiring Levaquin IV.  Based on her kidney function it has been given to her every 48 hours.  We are going to complete a treatment of 14 days.  At this moment she has 8 days left.  Her pneumonia has improved significantly clinically.  She is no longer requiring oxygen.  She is  able to ambulate.  Her cough has been decreasing.  Her shortness of breath has overall resolved.  She is able to ambulate more than 100 feet without getting short of breath.  She did have acute respiratory failure requiring oxygen, but now she is off the oxygen.   2.  As far as her urinary tract infection, group B strep, that will be treated with the Levaquin.  3.  Sepsis, has resolved.  It was evidenced by tachypnea, tachycardia and increased white blood cells.  4.  As far as her hyponatremia, it is mostly chronic.  We held the hydrochlorothiazide, but at this moment since it is overall stable and  resolved we are going to restart her hydrochlorothiazide, which is a low dose at 12.5, that will help controlling her blood pressure a little bit better.   5.  As far as her hypertension, it has been labile for a long time and the patient has been very concerned during this hospitalization.  We had longer discussions about her management.  She has been recently increased on metoprolol.  I am going to let Dr. Ubaldo Glassing address these medications for now as he already has a plan established.  The patient has been stable and I do not want to push new medications right now and make her blood pressure come down too fast once she is a little bit better from her pneumonia.   TIME SPENT:  I spent about 50 minutes educating the family today at discharge.    ____________________________ Judson Sink, MD rsg:ea D: 11/17/2012 13:26:50 ET T: 11/17/2012 22:24:42 ET JOB#: [id number]  cc: [place] Sink, MD, <Dictator> Javier Docker. Ubaldo Glassing, MD Irven Easterly. Kary Kos, MD Roselie Awkward America Brown MD ELECTRONICALLY SIGNED 11/21/2012 15:11

## 2014-09-12 NOTE — H&P (Signed)
PATIENT NAME:  Kristy Hardy, Kristy Hardy MR#:  [id number] DATE OF BIRTH:  08/24/1924  DATE OF ADMISSION:  11/12/2012  PRIMARY CARE PHYSICIAN: Irven Easterly. Kary Kos, MD  REFERRING PHYSICIAN:   Lenise Arena, MD   CHIEF COMPLAINT: Cough, sputum for 4 days.   HISTORY OF PRESENT ILLNESS: An 81 year old Caucasian female with a history of hypertension, breast cancer, osteoarthritis, presented in the ED with a cough, greenish sputum, sore throat for 4 days.  The patient also had a fever, chills with shortness of breath. The patient feels weak.  She went to her PCP's office and was sent from there for pneumonia. The patient's chest x-ray showed infiltrate, and she was treated with Zithromax in the ED.  The patient denies any chest pain, orthopnea, nocturnal dyspnea or leg edema, denies any dysuria, hematuria or incontinence.   PAST MEDICAL HISTORY: Hypertension, breast cancer, osteoarthritis.   PAST SURGICAL HISTORY: Cholecystectomy, left breast lumpectomy, status post chemo and tamoxifen treatment.   FAMILY HISTORY:  Sister has breast cancer. Mother has colon cancer.  Brother has bone cancer.   ALLERGIES: AUGMENTIN, SULFA DRUGS.   HOME MEDICATIONS:  1.  Tramadol 50 mg p.o. 1 to 2 tablets every 6 hours p.r.n.  2.  Simvastatin 1 tablet once a day at bedtime.  3.  Omeprazole 40 mg p.o. in the morning.  4.  Nucynta 50 mg p.o. every 6 hours p.r.n. for pain.  5.  Multivitamin 1 tablet once a day in the morning. 6.  Lopressor 50 mg p.o. daily in the morning.  7.  Iron 65 mg p.o. daily in the morning.  8.  Ibuprofen 200 mg p.o. 1 to 2 tablets every 6 hours p.r.n.  9.  HCTZ/losartan 12.5 mg/50 mg p.o. daily.  10.  Fish oil 1000 mg p.o. daily.  11.  Acetaminophen 1 tablet b.i.d.   REVIEW OF SYSTEMS:  CONSTITUTIONAL: The patient has a fever, chills, headache, dizziness and generalized weakness.  EYES: No double vision or blurry vision.  ENT: No postnasal drip, slurred speech or dysphagia. No epistaxis.   CARDIOVASCULAR: No chest pain, palpitation, orthopnea or nocturnal dyspnea. No leg edema.  PULMONARY: Positive for cough, sputum, shortness of breath but no hemoptysis. No wheezing.  GASTROINTESTINAL: Has nausea but no abdominal pain, vomiting or diarrhea. No melena or bloody stool. GENITOURINARY: No dysuria, hematuria or incontinence.  SKIN: No rash or jaundice.  HEMATOLOGY: No easy bruising or bleeding.  ENDOCRINE: No polyuria, polydipsia, heat or cold intolerance.  SKIN: No rash or jaundice.  NEUROLOGY: No syncope, loss of consciousness or seizure.   PHYSICAL EXAMINATION: VITAL SIGNS: Temperature 98.9, blood pressure 174/79, pulse 63, respirations 20, oxygen saturation 93% on room air.  GENERAL: The patient is alert, awake, oriented, in no acute distress.  HEENT: Pupils are round and equal, reactive to light and accommodation. Moist oral mucosa. Clear oropharynx.  NECK: Supple. No JVD or carotid bruits. No lymphadenopathy. No thyromegaly.  CARDIOVASCULAR: S1, S2 regular rate and rhythm. No murmurs or gallops.  PULMONARY: Bilateral air entry. No wheezing, but has crackles bilateral. No use of accessory muscles to breathe.  ABDOMEN: Soft. No distention or tenderness. No organomegaly. Bowel sounds present.  EXTREMITIES: No edema, clubbing or cyanosis. No calf tenderness. Strong bilateral pedal pulses.  SKIN: No rash or jaundice.  NEUROLOGY: A and O x 3. No focal deficit. Power 5 out of 5. Sensation intact.   LABORATORY AND RADIOLOGICAL DATA:  Urinalysis showed WBC 112, nitrite negative, RBC 9.  CBC showed WBC 17.7,  hemoglobin 11.4, platelets 338.  Glucose 116, BUN 20, creatinine 1.01, sodium 128, potassium 3.7, chloride 92. Troponin less than 0.02.  Chest x-ray showed bilateral diffuse interstitial thickening likely representing interstitial edema versus interstitial pneumonitis.  EKG showed normal sinus rhythm with a right bundle branch block at 71 BPM.   IMPRESSIONS: 1.  Pneumonia,  community-acquired.   2.  Urinary tract infection.  3.  Systemic inflammatory response syndrome.  4.  Hyponatremia.  5.  Anemia.  6.  Hypertension.   PLAN OF TREATMENT: 1.  The patient will be admitted to the medical floor. We will start Levaquin IVPB and follow up CBC, blood culture, urine culture.  2.  The patient has hyponatremia and mild dehydration. We will start normal saline IV. Follow up BMP.  3.  We will hold HCTZ due to hyponatremia and dehydration and continue losartan, Lopressor for hypertension.  4.  Gastrointestinal and deep vein thrombosis prophylaxis.   I discussed the patient's condition and plan of treatment with the patient, the patient's daughter and sister.   CODE STATUS: The patient wants FULL CODE.     TIME SPENT: About 57 minutes.   ____________________________ Demetrios Loll, MD qc:cb D: 11/12/2012 21:41:11 ET T: 11/12/2012 22:06:06 ET JOB#: [id number]  cc: Demetrios Loll, MD, <Dictator> Demetrios Loll MD ELECTRONICALLY SIGNED 11/13/2012 15:23

## 2014-09-15 LAB — SURGICAL PATHOLOGY

## 2014-09-21 NOTE — Op Note (Signed)
PATIENT NAME:  Kristy Hardy, REITZ MR#:  [id number] DATE OF BIRTH:  1924/09/02  DATE OF PROCEDURE:  09/09/2014  PREOPERATIVE DIAGNOSIS: Carcinoma, right breast.   POSTOPERATIVE DIAGNOSIS: Carcinoma, right breast.   OPERATION: Right breast lumpectomy, sentinel node biopsy and intraoperative ultrasound guidance under localization of primary lesion in the breast.   SURGEON: Mckinley Jewel, M.D.   ANESTHESIA: General.   COMPLICATIONS: None.   ESTIMATED BLOOD LOSS: Minimal.   DRAINS: None.   DESCRIPTION OF PROCEDURE: The patient underwent nuclear contrast injection to the right breast in the preoperative setting. She was brought to the operating room and 6 mL of 50% diluted methylene blue was also instilled in the subareolar tissue on the right. She was put to sleep with a LMA and the right breast and axilla were then prepped and draped out as a sterile field. Timeout was performed. Using the gamma finder, there was some limited activity noted on the medial part of the axilla, but the signal strength was somewhat low. However, the rest of the axilla had absolutely no activity. Accordingly, a transverse skin incision was mapped out and deepened through the layers down to the axillary fat pad. The blue dye did not appear to have traversal the distance to the lymph node, however, with the gamma finder a single 4 mm node was identified with signal activity located in the inferior medial portion. This was removed and sent off as sentinel node. There was no other grossly visible palpable or gamma finder activity involved in the lymph node in the axilla following this. Further attempts were not made. This area was subsequently closed with 2-0 Vicryl in the deeper tissues and the skin with subcuticular 4-0 Vicryl. Frozen section of the lymph node did not show evidence of macrometastases. A lumpectomy was then performed with ultrasound guidance. A Bard ultra-wire was positioned going across the lesion, in the 2  o'clock location, of the right breast, and the wire anchored in place. Following this, a curvilinear incision was made on the upper inner quadrant, at the 1 to 2 o'clock position, and carefully deepened through the layers down to the breast tissue. The skin and subcutaneous tissue were elevated on both sides. Using the wire as a guide, a core of tissue was excised out and this was noted to contain the lesion and grossly the margins appeared clear. Bleeding was controlled with cautery. The lumpectomy was tagged for margins. Specimen mammogram was obtained showing the presence of the clip within this area and sent to pathology. The deeper tissues were closed in 2 layers with 2-0 Vicryl and the skin incision closed with subcuticular 4-0 Vicryl. Both incisions were covered with LiquiBand. The procedure was well tolerated. She was subsequently extubated and returned to the recovery room in stable condition.    ____________________________ S.Robinette Haines, MD sgs:sb D: 09/09/2014 13:03:29 ET T: 09/09/2014 13:50:38 ET JOB#: [id number]  cc: S.G. Jamal Collin, MD, <Dictator> [facility] Robinette Haines MD ELECTRONICALLY SIGNED 09/11/2014 8:51

## 2014-09-22 ENCOUNTER — Ambulatory Visit (INDEPENDENT_AMBULATORY_CARE_PROVIDER_SITE_OTHER): Payer: Medicare Other | Admitting: General Surgery

## 2014-09-22 ENCOUNTER — Encounter: Payer: Self-pay | Admitting: General Surgery

## 2014-09-22 VITALS — BP 110/58 | HR 72 | Resp 14 | Ht 65.0 in | Wt 178.0 lb

## 2014-09-22 DIAGNOSIS — C50211 Malignant neoplasm of upper-inner quadrant of right female breast: Secondary | ICD-10-CM

## 2014-09-22 DIAGNOSIS — Z853 Personal history of malignant neoplasm of breast: Secondary | ICD-10-CM

## 2014-09-22 NOTE — Patient Instructions (Signed)
Follow up in 2-3 months

## 2014-09-22 NOTE — Progress Notes (Signed)
This is a 81 year old female here today for her post op right breast wide excison done on 09/09/14. Patient states she is doing well. Patient has appointment with Dr. Oliva Bustard next week.  Right breast incision and axillary incision are clean and healing well.  Pathology reviewed, T1cN0. Patient has appointment to see radiation oncology next week also.  Follow up in 2-3 months.

## 2014-09-23 ENCOUNTER — Encounter: Payer: Self-pay | Admitting: General Surgery

## 2014-09-26 ENCOUNTER — Other Ambulatory Visit: Payer: Self-pay | Admitting: *Deleted

## 2014-09-26 DIAGNOSIS — C50919 Malignant neoplasm of unspecified site of unspecified female breast: Secondary | ICD-10-CM

## 2014-09-29 ENCOUNTER — Inpatient Hospital Stay (HOSPITAL_BASED_OUTPATIENT_CLINIC_OR_DEPARTMENT_OTHER): Payer: Medicare Other | Admitting: Oncology

## 2014-09-29 ENCOUNTER — Inpatient Hospital Stay: Payer: Medicare Other | Attending: Oncology

## 2014-09-29 VITALS — BP 133/73 | HR 56 | Ht 65.0 in | Wt 180.1 lb

## 2014-09-29 DIAGNOSIS — J449 Chronic obstructive pulmonary disease, unspecified: Secondary | ICD-10-CM

## 2014-09-29 DIAGNOSIS — Z853 Personal history of malignant neoplasm of breast: Secondary | ICD-10-CM | POA: Diagnosis not present

## 2014-09-29 DIAGNOSIS — Z9223 Personal history of estrogen therapy: Secondary | ICD-10-CM

## 2014-09-29 DIAGNOSIS — Z800 Family history of malignant neoplasm of digestive organs: Secondary | ICD-10-CM

## 2014-09-29 DIAGNOSIS — C50919 Malignant neoplasm of unspecified site of unspecified female breast: Secondary | ICD-10-CM

## 2014-09-29 DIAGNOSIS — Z79899 Other long term (current) drug therapy: Secondary | ICD-10-CM | POA: Insufficient documentation

## 2014-09-29 DIAGNOSIS — C50911 Malignant neoplasm of unspecified site of right female breast: Secondary | ICD-10-CM

## 2014-09-29 DIAGNOSIS — Z170 Estrogen receptor positive status [ER+]: Secondary | ICD-10-CM | POA: Diagnosis not present

## 2014-09-29 DIAGNOSIS — Z923 Personal history of irradiation: Secondary | ICD-10-CM | POA: Diagnosis not present

## 2014-09-29 DIAGNOSIS — Z7982 Long term (current) use of aspirin: Secondary | ICD-10-CM

## 2014-09-29 DIAGNOSIS — C50211 Malignant neoplasm of upper-inner quadrant of right female breast: Secondary | ICD-10-CM | POA: Diagnosis not present

## 2014-09-29 DIAGNOSIS — Z803 Family history of malignant neoplasm of breast: Secondary | ICD-10-CM | POA: Insufficient documentation

## 2014-09-29 DIAGNOSIS — C183 Malignant neoplasm of hepatic flexure: Secondary | ICD-10-CM | POA: Insufficient documentation

## 2014-09-29 LAB — COMPREHENSIVE METABOLIC PANEL
ALT: 18 U/L (ref 14–54)
ANION GAP: 8 (ref 5–15)
AST: 24 U/L (ref 15–41)
Albumin: 3.9 g/dL (ref 3.5–5.0)
Alkaline Phosphatase: 60 U/L (ref 38–126)
BUN: 20 mg/dL (ref 6–20)
CO2: 28 mmol/L (ref 22–32)
Calcium: 9.3 mg/dL (ref 8.9–10.3)
Chloride: 99 mmol/L — ABNORMAL LOW (ref 101–111)
Creatinine, Ser: 0.81 mg/dL (ref 0.44–1.00)
GFR calc Af Amer: >60 mL/min (ref >60)
GFR calc non Af Amer: >60 mL/min (ref >60)
GLUCOSE: 112 mg/dL — AB (ref 65–99)
Potassium: 3.9 mmol/L (ref 3.5–5.1)
Sodium: 135 mmol/L (ref 135–145)
Total Bilirubin: 0.6 mg/dL (ref 0.3–1.2)
Total Protein: 7.5 g/dL (ref 6.5–8.1)

## 2014-09-29 LAB — CBC WITH DIFFERENTIAL/PLATELET
Basophils Absolute: 0.1 10*3/uL (ref 0–0.1)
Basophils Relative: 1 %
EOS ABS: 0.2 10*3/uL (ref 0–0.7)
Eosinophils Relative: 2 %
HCT: 37.2 % (ref 35.0–47.0)
Hemoglobin: 12.7 g/dL (ref 12.0–16.0)
LYMPHS ABS: 2 10*3/uL (ref 1.0–3.6)
Lymphocytes Relative: 19 %
MCH: 30.6 pg (ref 26.0–34.0)
MCHC: 34.1 g/dL (ref 32.0–36.0)
MCV: 89.8 fL (ref 80.0–100.0)
Monocytes Absolute: 1.3 10*3/uL — ABNORMAL HIGH (ref 0.2–0.9)
Monocytes Relative: 12 %
Neutro Abs: 7 10*3/uL — ABNORMAL HIGH (ref 1.4–6.5)
Neutrophils Relative %: 66 %
Platelets: 335 10*3/uL (ref 150–440)
RBC: 4.14 MIL/uL (ref 3.80–5.20)
RDW: 12.6 % (ref 11.5–14.5)
WBC: 10.6 10*3/uL (ref 3.6–11.0)

## 2014-09-29 MED ORDER — LEVOFLOXACIN 500 MG PO TABS: 500.0000 mg | ORAL_TABLET | Freq: Every day | ORAL | Status: DC

## 2014-09-29 MED ORDER — LORAZEPAM 1 MG PO TABS: 1.0000 mg | ORAL_TABLET | Freq: Every evening | ORAL | Status: DC | PRN

## 2014-10-08 ENCOUNTER — Encounter (INDEPENDENT_AMBULATORY_CARE_PROVIDER_SITE_OTHER): Payer: Self-pay

## 2014-10-08 ENCOUNTER — Ambulatory Visit
Admission: RE | Admit: 2014-10-08 | Discharge: 2014-10-08 | Disposition: A | Discharge status: Home / Self Care | Payer: Medicare Other | Source: Ambulatory Visit | Attending: Radiation Oncology | Admitting: Radiation Oncology

## 2014-10-08 ENCOUNTER — Encounter: Payer: Self-pay | Admitting: Radiation Oncology

## 2014-10-08 VITALS — BP 130/63 | HR 57 | Resp 20 | Ht 65.0 in | Wt 178.9 lb

## 2014-10-08 DIAGNOSIS — C50911 Malignant neoplasm of unspecified site of right female breast: Secondary | ICD-10-CM

## 2014-10-08 DIAGNOSIS — Z510 Encounter for antineoplastic radiation therapy: Secondary | ICD-10-CM | POA: Insufficient documentation

## 2014-10-08 DIAGNOSIS — Z853 Personal history of malignant neoplasm of breast: Secondary | ICD-10-CM

## 2014-10-08 DIAGNOSIS — C50411 Malignant neoplasm of upper-outer quadrant of right female breast: Secondary | ICD-10-CM | POA: Insufficient documentation

## 2014-10-08 HISTORY — DX: Malignant neoplasm of unspecified site of unspecified female breast: C50.919

## 2014-10-08 NOTE — Consult Note (Signed)
Radiation Oncology NEW PATIENT EVALUATION  Name: Kristy Hardy  MRN: [id number]  Date:   10/08/2014     DOB: 03-Apr-1925   This 81 y.o. female patient presents to the clinic for initial evaluation of breast cancer stage I (T1 cN0 M0) ER/PR positive HER-2/neu not overexpressed inpatient treated to her left breast 20 years prior with lumpectomy and radiation therapy.  REFERRING PHYSICIAN: [name], MD  CHIEF COMPLAINT:  Chief Complaint  Patient presents with  . Breast Cancer    Inital consult for radiation therapy for right breast cancer.     DIAGNOSIS: The primary encounter diagnosis was Malignant neoplasm of female breast, right. A diagnosis of Personal history of malignant neoplasm of breast was also pertinent to this visit.   PREVIOUS INVESTIGATIONS:  Mammograms and ultrasound reviewed Surgical pathology reports reviewed Clinical notes reviewed  HPI: Patient is an 81 year old female well known to our department who was treated 20 years prior for left breast cancer. She recently had a mammogram performed 08/04/2014 showing a suspicious lesion at the 2:00 position measuring 0.6 cm. She underwent ultrasound-guided biopsy which was positive for ductal carcinoma in situ. This was followed by wide local excision and sentinel node biopsy. Surgical pathology report revealed 1.4 cm invasive mammary carcinoma overall grade 2 with margins clear but close at less than 1 mm. One sentinel lymph node was negative for metastatic disease lymphovascular invasion not identified. Tumor was ER/PR positive HER-2/neu not overexpressed. She's been seen by medical oncology and aromatase inhibitor therapy has been recommended. She is not a candidate based on her age size of tumor for systemic chemotherapy. She is seen today for consideration of whole breast radiation. She's had mastitis after her wide local excision which is just resolving after approximately 10 day course of antibiotic  therapy.  PLANNED TREATMENT REGIMEN: Whole breast radiation plus electron scar boost  PAST MEDICAL HISTORY:  has a past medical history of Arthritis; Pneumonia (July 2014); COPD (chronic obstructive pulmonary disease); Heart valve problem; Cancer (11-11-91); Cancer (08-12-14); and Breast cancer.    PAST SURGICAL HISTORY:  Past Surgical History  Procedure Laterality Date  . Colonoscopy    . Cholecystectomy  02/23/1972  . Breast surgery Left 11-11-91    lumpectomy with radiation, Tamoxifen for 2 years.Dr. Sharlet Salina  . Breast surgery Right 09/09/14    RIGHT BREAST WIDE EXCISION     FAMILY HISTORY: family history includes Breast cancer (age of onset: 3) in her sister; Colon cancer (age of onset: 12) in her mother; Colon cancer (age of onset: 25) in her brother.  SOCIAL HISTORY:  reports that she has never smoked. She has never used smokeless tobacco. She reports that she does not drink alcohol or use illicit drugs.  ALLERGIES: Amoxicillin-pot clavulanate and Other  MEDICATIONS:  Current Outpatient Prescriptions  Medication Sig Dispense Refill  . acetaminophen (TYLENOL) 650 MG CR tablet Take by mouth.    . Albuterol Sulfate (PROAIR HFA IN) Inhale into the lungs as needed.    Marland Kitchen amLODipine (NORVASC) 5 MG tablet Take 5 mg by mouth daily.     Marland Kitchen aspirin 81 MG tablet Take 81 mg by mouth daily.    . fluticasone (FLONASE) 50 MCG/ACT nasal spray Place 1 spray into both nostrils daily.   11  . LORazepam (ATIVAN) 1 MG tablet Take 1 tablet (1 mg total) by mouth at bedtime as needed for anxiety. 30 tablet 3  . losartan-hydrochlorothiazide (HYZAAR) 50-12.5 MG per tablet Take 1 tablet by mouth daily.     Marland Kitchen  Magnesium 250 MG TABS Take by mouth daily.    . metoprolol succinate (TOPROL-XL) 50 MG 24 hr tablet     . omeprazole (PRILOSEC) 40 MG capsule Take 40 mg by mouth daily.    . simvastatin (ZOCOR) 20 MG tablet Take 20 mg by mouth daily at 6 PM.     . levofloxacin (LEVAQUIN) 500 MG tablet Take 1 tablet  (500 mg total) by mouth daily. (Patient not taking: Reported on 10/08/2014) 7 tablet 0   No current facility-administered medications for this encounter.    ECOG PERFORMANCE STATUS:  0 - Asymptomatic  REVIEW OF SYSTEMS: Except for recent mastitis and some fatigue Patient denies any weight loss, fatigue, weakness, fever, chills or night sweats. Patient denies any loss of vision, blurred vision. Patient denies any ringing  of the ears or hearing loss. No irregular heartbeat. Patient denies heart murmur or history of fainting. Patient denies any chest pain or pain radiating to her upper extremities. Patient denies any shortness of breath, difficulty breathing at night, cough or hemoptysis. Patient denies any swelling in the lower legs. Patient denies any nausea vomiting, vomiting of blood, or coffee ground material in the vomitus. Patient denies any stomach pain. Patient states has had normal bowel movements no significant constipation or diarrhea. Patient denies any dysuria, hematuria or significant nocturia. Patient denies any problems walking, swelling in the joints or loss of balance. Patient denies any skin changes, loss of hair or loss of weight. Patient denies any excessive worrying or anxiety or significant depression. Patient denies any problems with insomnia. Patient denies excessive thirst, polyuria, polydipsia. Patient denies any swollen glands, patient denies easy bruising or easy bleeding. Patient denies any recent infections, allergies or URI. Patient "s visual fields have not changed significantly in recent time.    PHYSICAL EXAM: BP 130/63 mmHg  Pulse 57  Resp 20  Ht '5\' 5"'  (1.651 m)  Wt 178 lb 14.5 oz (81.15 kg)  BMI 29.77 kg/m2 Well-developed elderly female in NAD. She status post recent wide local excision of the right breast. Still some residual erythema of the breast but no evidence of overt mastitis. No dominant mass or nodularity is noted in either breast in 2 positions examined.  No axillary or supraclavicular adenopathy is appreciated. Well-developed well-nourished patient in NAD. HEENT reveals PERLA, EOMI, discs not visualized.  Oral cavity is clear. No oral mucosal lesions are identified. Neck is clear without evidence of cervical or supraclavicular adenopathy. Lungs are clear to A&P. Cardiac examination is essentially unremarkable with regular rate and rhythm without murmur rub or thrill. Abdomen is benign with no organomegaly or masses noted. Motor sensory and DTR levels are equal and symmetric in the upper and lower extremities. Cranial nerves II through XII are grossly intact. Proprioception is intact. No peripheral adenopathy or edema is identified. No motor or sensory levels are noted. Crude visual fields are within normal range.   LABORATORY DATA:  Surgical pathology reports reviewed   RADIOLOGY RESULTS: Mammogram and all ultrasound reviewed  IMPRESSION: Stage I invasive mammary carcinoma the right breast status post wide local excision and sentinel node biopsy in 81 year old female ER/PR positive HER-2/neu not overexpressed for whole breast radiation  PLAN: At this time I have recommended whole breast radiation up to 5000 cGy based on the patient's large volume of breast. She is not a candidate for hyperfractionated treatment. Also based on the close margin would boost her scar another 1600 cGy using electron beam. Risks and benefits of treatment including  skin reaction, fatigue, alteration of blood counts, inclusion of superficial lung all were reviewed in detail the patient and her daughter. We will also avoid her contralateral breast from prior radiation therapy 20 years prior. I have set up and ordered CT simulation next week. Patient also be candidate for aromatase inhibitor therapy after completion of radiation.  I would like to take this opportunity for allowing me to participate in the care of your patient.Armstead Peaks., MD

## 2014-10-08 NOTE — Progress Notes (Signed)
Referred by Dr. Oliva Bustard, patient is here to be evaluated for Radiaiton therapy for Right Breast Cancer.  She was treated with radiation on the left Breast approx 20 years ago.

## 2014-10-10 ENCOUNTER — Encounter: Payer: Self-pay | Admitting: Oncology

## 2014-10-12 ENCOUNTER — Encounter: Payer: Self-pay | Admitting: Oncology

## 2014-10-12 DIAGNOSIS — C50911 Malignant neoplasm of unspecified site of right female breast: Secondary | ICD-10-CM | POA: Insufficient documentation

## 2014-10-12 NOTE — Progress Notes (Signed)
[place] @ [facility] [phone]  Fax:([phone]) [place]: May 09, 1925  MR#: [id number]  GOT#:[id number]  Patient Care Team: [name], MD as PCP - General (Family Medicine) Seeplaputhur Robinette Haines, MD (General Surgery) Forest Gleason, MD (Oncology)  CHIEF COMPLAINT:  Chief Complaint  Patient presents with  . Follow-up  . Breast Cancer    Oncology History   1.Abnormal mammogram followed by biopsy of the right breast.  A 2:00 position patient had an abnormal 26m mass which has been biopsied.  Was positive for invasive carcinoma.  Estrogen and progesterone receptor and HER-2/neu receptor pending (March, 2016) 2.  Left breast carcinoma breast 22 years ago patient had lumpectomy radiation therapy and 2 years of tamoxifen. 3.  Patient underwent wide local excision on April 29 and sentinel lymph node biopsy.  Low-grade tumor estrogen and progesterone receptor positive HER-2 receptor negative T1 cN0 M0.     Carcinoma of left breast   10/12/2014 Initial Diagnosis Carcinoma of left breast    No flowsheet data found.  INTERVAL HISTORY: 81year old lady underwent wide local excision of the right breast.  1.4 cm tumor was found which was estrogen and progesterone receptor positive.  HER-2 receptor negative.  Margins were negative.  There was no lymphovascular invasion.  Patient is here to discuss local as well as systemic adjuvant therapy.  REVIEW OF SYSTEMS:   GENERAL:  Feels good.  Active.  No fevers, sweats or weight loss. PERFORMANCE STATUS (ECOG): 01 HEENT:  No visual changes, runny nose, sore throat, mouth sores or tenderness. Lungs: No shortness of breath or cough.  No hemoptysis. Cardiac:  No chest pain, palpitations, orthopnea, or PND. GI:  No nausea, vomiting, diarrhea, constipation, melena or hematochezia. GU:  No urgency, frequency, dysuria, or hematuria. Musculoskeletal:  No back pain.  No joint pain.  No muscle tenderness. Extremities:   No pain or swelling. Skin:  No rashes or skin changes. Neuro:  No headache, numbness or weakness, balance or coordination issues. Endocrine:  No diabetes, thyroid issues, hot flashes or night sweats. Psych:  No mood changes, depression or anxiety. Pain:  No focal pain. Review of systems:  All other systems reviewed and found to be negative.  As per HPI. Otherwise, a complete review of systems is negatve.  PAST MEDICAL HISTORY: Past Medical History  Diagnosis Date  . Arthritis   . Pneumonia July 2014  . COPD (chronic obstructive pulmonary disease)   . Heart valve problem     leaking  . Cancer 11-11-91    left breast   . Cancer 08-12-14    right breast, ER/PR positive, Her2 negative  . Breast cancer   . Carcinoma of left breast 10/12/2014  . Carcinoma of right breast 10/12/2014    PAST SURGICAL HISTORY: Past Surgical History  Procedure Laterality Date  . Colonoscopy    . Cholecystectomy  02/23/1972  . Breast surgery Left 11-11-91    lumpectomy with radiation, Tamoxifen for 2 years.Dr. CSharlet Salina . Breast surgery Right 09/09/14    RIGHT BREAST WIDE EXCISION     FAMILY HISTORY Family History  Problem Relation Age of Onset  . Colon cancer Mother 646 . Colon cancer Brother 727 . Breast cancer Sister 315       ADVANCED DIRECTIVES: Patient does have advanced medical care directive   HEALTH MAINTENANCE: History  Substance Use Topics  . Smoking status: Never Smoker   . Smokeless tobacco: Never Used  . Alcohol  Use: No      Allergies  Allergen Reactions  . Amoxicillin-Pot Clavulanate Nausea And Vomiting  . Other Nausea And Vomiting    Current Outpatient Prescriptions  Medication Sig Dispense Refill  . acetaminophen (TYLENOL) 650 MG CR tablet Take by mouth.    . Albuterol Sulfate (PROAIR HFA IN) Inhale into the lungs as needed.    Marland Kitchen amLODipine (NORVASC) 5 MG tablet Take 5 mg by mouth daily.     Marland Kitchen aspirin 81 MG tablet Take 81 mg by mouth daily.    . fluticasone  (FLONASE) 50 MCG/ACT nasal spray Place 1 spray into both nostrils daily.   11  . losartan-hydrochlorothiazide (HYZAAR) 50-12.5 MG per tablet Take 1 tablet by mouth daily.     . Magnesium 250 MG TABS Take by mouth daily.    . metoprolol succinate (TOPROL-XL) 50 MG 24 hr tablet     . omeprazole (PRILOSEC) 40 MG capsule Take 40 mg by mouth daily.    . simvastatin (ZOCOR) 20 MG tablet Take 20 mg by mouth daily at 6 PM.     . levofloxacin (LEVAQUIN) 500 MG tablet Take 1 tablet (500 mg total) by mouth daily. (Patient not taking: Reported on 10/08/2014) 7 tablet 0  . LORazepam (ATIVAN) 1 MG tablet Take 1 tablet (1 mg total) by mouth at bedtime as needed for anxiety. 30 tablet 3   No current facility-administered medications for this visit.    OBJECTIVE:  Filed Vitals:   09/29/14 1100  BP: 133/73  Pulse: 56     Body mass index is 29.97 kg/(m^2).    ECOG FS:1 - Symptomatic but completely ambulatory  PHYSICAL EXAM: GENERAL:  Well developed, well nourished, sitting comfortably in the exam room in no acute distress. MENTAL STATUS:  Alert and oriented to person, place and time. HEAD:    Normocephalic, atraumatic, face symmetric, no Cushingoid features. EYES.  Pupils equal round and reactive to light and accomodation.  No conjunctivitis or scleral icterus. ENT:  Oropharynx clear without lesion.  Tongue normal. Mucous membranes moist.  RESPIRATORY:  Clear to auscultation without rales, wheezes or rhonchi. CARDIOVASCULAR:  Regular rate and rhythm without murmur, rub or gallop. BREAST:  Right breast without masses,.  Right breast wound is healing well.  There is seroma which is being followed by Dr. Jamal Collin. no skin changes or nipple discharge.  Left breast without masses, skin changes or nipple discharge. ABDOMEN:  Soft, non-tender, with active bowel sounds, and no hepatosplenomegaly.  No masses. BACK:  No CVA tenderness.  No tenderness on percussion of the back or rib cage. SKIN:  No rashes, ulcers or  lesions. EXTREMITIES: No edema, no skin discoloration or tenderness.  No palpable cords. LYMPH NODES: No palpable cervical, supraclavicular, axillary or inguinal adenopathy  NEUROLOGICAL: Unremarkable. PSYCH:  Appropriate.   LAB RESULTS:  Appointment on 09/29/2014  Component Date Value Ref Range Status  . WBC 09/29/2014 10.6  3.6 - 11.0 K/uL Final  . RBC 09/29/2014 4.14  3.80 - 5.20 MIL/uL Final  . Hemoglobin 09/29/2014 12.7  12.0 - 16.0 g/dL Final  . HCT 09/29/2014 37.2  35.0 - 47.0 % Final  . MCV 09/29/2014 89.8  80.0 - 100.0 fL Final  . MCH 09/29/2014 30.6  26.0 - 34.0 pg Final  . MCHC 09/29/2014 34.1  32.0 - 36.0 g/dL Final  . RDW 09/29/2014 12.6  11.5 - 14.5 % Final  . Platelets 09/29/2014 335  150 - 440 K/uL Final  . Neutrophils Relative %  09/29/2014 66   Final  . Neutro Abs 09/29/2014 7.0* 1.4 - 6.5 K/uL Final  . Lymphocytes Relative 09/29/2014 19   Final  . Lymphs Abs 09/29/2014 2.0  1.0 - 3.6 K/uL Final  . Monocytes Relative 09/29/2014 12   Final  . Monocytes Absolute 09/29/2014 1.3* 0.2 - 0.9 K/uL Final  . Eosinophils Relative 09/29/2014 2   Final  . Eosinophils Absolute 09/29/2014 0.2  0 - 0.7 K/uL Final  . Basophils Relative 09/29/2014 1   Final  . Basophils Absolute 09/29/2014 0.1  0 - 0.1 K/uL Final  . Sodium 09/29/2014 135  135 - 145 mmol/L Final  . Potassium 09/29/2014 3.9  3.5 - 5.1 mmol/L Final  . Chloride 09/29/2014 99* 101 - 111 mmol/L Final  . CO2 09/29/2014 28  22 - 32 mmol/L Final  . Glucose, Bld 09/29/2014 112* 65 - 99 mg/dL Final  . BUN 09/29/2014 20  6 - 20 mg/dL Final  . Creatinine, Ser 09/29/2014 0.81  0.44 - 1.00 mg/dL Final  . Calcium 09/29/2014 9.3  8.9 - 10.3 mg/dL Final  . Total Protein 09/29/2014 7.5  6.5 - 8.1 g/dL Final  . Albumin 09/29/2014 3.9  3.5 - 5.0 g/dL Final  . AST 09/29/2014 24  15 - 41 U/L Final  . ALT 09/29/2014 18  14 - 54 U/L Final  . Alkaline Phosphatase 09/29/2014 60  38 - 126 U/L Final  . Total Bilirubin 09/29/2014 0.6   0.3 - 1.2 mg/dL Final  . GFR calc non Af Amer 09/29/2014 >60  >60 mL/min Final  . GFR calc Af Amer 09/29/2014 >60  >60 mL/min Final   Comment: (NOTE) The eGFR has been calculated using the CKD EPI equation. This calculation has not been validated in all clinical situations. eGFR's persistently <60 mL/min signify possible Chronic Kidney Disease.   . Anion gap 09/29/2014 8  5 - 15 Final    Lab Results  Component Value Date   LABCA2 22.6 09/01/2014   No results found for: CA199 Lab Results  Component Value Date   CEA 1.6 09/01/2014   No results found for: PSA No results found for: CA125   STUDIES: No results found.  ASSESSMENT: 1.  Diagnosis of right breast cancer stage IC estrogen receptor positive progesterone receptor positive had lumpectomy and axillary lymph node evaluation with sentinel lymph node biopsy.  Estrogen and progesterone receptor positive.  (Diagnosis in April of 2016) 2.  Previous history of carcinoma of left breast 22 years ago status post lumpectomy radiation and tamoxifen for 2 years   MEDICAL DECISION MAKING:  I had detailed discussion with patient.  Considering patient's overall age 81 as well as her desire not to pursue any chemotherapy and considering the fact that patient has a low-grade tumor and hormone receptor positive we are not ordering any Oncotype DX. Patient will proceed with local radiation therapy After radiation therapy is finished patient would be evaluated for anti-hormonal therapy with letrozole Baseline bone density study will be obtained Risk and benefit of letrozole therapy including osteoporosis and need for bone density calcium and vitamin D had been discussed. I discussed this case with Dr. Jamal Collin as well as with Dr. Baruch Gouty and they are in agreement with the plan  Patient expressed understanding and was in agreement with this plan. She also understands that She can call clinic at any time with any questions, concerns, or  complaints.    Carcinoma of left breast   Staging form: Breast, AJCC 7th  Edition     Clinical stage from 09/19/2014: Stage IA (yT1c, N0, M0) - Marni Griffon, MD   10/12/2014 3:38 PM

## 2014-10-13 ENCOUNTER — Ambulatory Visit
Admission: RE | Admit: 2014-10-13 | Discharge: 2014-10-13 | Disposition: A | Discharge status: Home / Self Care | Payer: Medicare Other | Source: Ambulatory Visit | Attending: Radiation Oncology | Admitting: Radiation Oncology

## 2014-10-13 DIAGNOSIS — C50411 Malignant neoplasm of upper-outer quadrant of right female breast: Secondary | ICD-10-CM | POA: Diagnosis not present

## 2014-10-13 DIAGNOSIS — Z510 Encounter for antineoplastic radiation therapy: Secondary | ICD-10-CM | POA: Diagnosis present

## 2014-10-16 DIAGNOSIS — Z510 Encounter for antineoplastic radiation therapy: Secondary | ICD-10-CM | POA: Diagnosis not present

## 2014-10-17 ENCOUNTER — Other Ambulatory Visit: Payer: Self-pay | Admitting: *Deleted

## 2014-10-17 DIAGNOSIS — C50911 Malignant neoplasm of unspecified site of right female breast: Secondary | ICD-10-CM

## 2014-10-22 ENCOUNTER — Ambulatory Visit
Admission: RE | Admit: 2014-10-22 | Discharge: 2014-10-22 | Disposition: A | Discharge status: Home / Self Care | Payer: Medicare Other | Source: Ambulatory Visit | Attending: Radiation Oncology | Admitting: Radiation Oncology

## 2014-10-22 DIAGNOSIS — Z510 Encounter for antineoplastic radiation therapy: Secondary | ICD-10-CM | POA: Diagnosis not present

## 2014-10-23 ENCOUNTER — Ambulatory Visit
Admission: RE | Admit: 2014-10-23 | Discharge: 2014-10-23 | Disposition: A | Discharge status: Home / Self Care | Payer: Medicare Other | Source: Ambulatory Visit | Attending: Radiation Oncology | Admitting: Radiation Oncology

## 2014-10-23 DIAGNOSIS — Z510 Encounter for antineoplastic radiation therapy: Secondary | ICD-10-CM | POA: Diagnosis not present

## 2014-10-24 ENCOUNTER — Ambulatory Visit
Admission: RE | Admit: 2014-10-24 | Discharge: 2014-10-24 | Disposition: A | Discharge status: Home / Self Care | Payer: Medicare Other | Source: Ambulatory Visit | Attending: Radiation Oncology | Admitting: Radiation Oncology

## 2014-10-24 DIAGNOSIS — Z510 Encounter for antineoplastic radiation therapy: Secondary | ICD-10-CM | POA: Diagnosis not present

## 2014-10-27 ENCOUNTER — Ambulatory Visit
Admission: RE | Admit: 2014-10-27 | Discharge: 2014-10-27 | Disposition: A | Discharge status: Home / Self Care | Payer: Medicare Other | Source: Ambulatory Visit | Attending: Radiation Oncology | Admitting: Radiation Oncology

## 2014-10-27 DIAGNOSIS — Z510 Encounter for antineoplastic radiation therapy: Secondary | ICD-10-CM | POA: Diagnosis not present

## 2014-10-28 ENCOUNTER — Ambulatory Visit: Payer: Medicare Other | Admitting: Radiation Oncology

## 2014-10-28 ENCOUNTER — Ambulatory Visit
Admission: RE | Admit: 2014-10-28 | Discharge: 2014-10-28 | Disposition: A | Discharge status: Home / Self Care | Payer: Medicare Other | Source: Ambulatory Visit | Attending: Radiation Oncology | Admitting: Radiation Oncology

## 2014-10-28 DIAGNOSIS — Z510 Encounter for antineoplastic radiation therapy: Secondary | ICD-10-CM | POA: Diagnosis not present

## 2014-10-29 ENCOUNTER — Ambulatory Visit
Admission: RE | Admit: 2014-10-29 | Discharge: 2014-10-29 | Disposition: A | Discharge status: Home / Self Care | Payer: Medicare Other | Source: Ambulatory Visit | Attending: Radiation Oncology | Admitting: Radiation Oncology

## 2014-10-29 DIAGNOSIS — Z510 Encounter for antineoplastic radiation therapy: Secondary | ICD-10-CM | POA: Diagnosis not present

## 2014-10-30 ENCOUNTER — Ambulatory Visit
Admission: RE | Admit: 2014-10-30 | Discharge: 2014-10-30 | Disposition: A | Discharge status: Home / Self Care | Payer: Medicare Other | Source: Ambulatory Visit | Attending: Radiation Oncology | Admitting: Radiation Oncology

## 2014-10-30 DIAGNOSIS — Z510 Encounter for antineoplastic radiation therapy: Secondary | ICD-10-CM | POA: Diagnosis not present

## 2014-10-31 ENCOUNTER — Ambulatory Visit
Admission: RE | Admit: 2014-10-31 | Discharge: 2014-10-31 | Disposition: A | Discharge status: Home / Self Care | Payer: Medicare Other | Source: Ambulatory Visit | Attending: Radiation Oncology | Admitting: Radiation Oncology

## 2014-10-31 DIAGNOSIS — Z510 Encounter for antineoplastic radiation therapy: Secondary | ICD-10-CM | POA: Diagnosis not present

## 2014-11-03 ENCOUNTER — Ambulatory Visit
Admission: RE | Admit: 2014-11-03 | Discharge: 2014-11-03 | Disposition: A | Discharge status: Home / Self Care | Payer: Medicare Other | Source: Ambulatory Visit | Attending: Radiation Oncology | Admitting: Radiation Oncology

## 2014-11-03 ENCOUNTER — Inpatient Hospital Stay: Payer: Medicare Other | Attending: Oncology

## 2014-11-03 DIAGNOSIS — C50211 Malignant neoplasm of upper-inner quadrant of right female breast: Secondary | ICD-10-CM | POA: Insufficient documentation

## 2014-11-03 DIAGNOSIS — Z510 Encounter for antineoplastic radiation therapy: Secondary | ICD-10-CM | POA: Diagnosis not present

## 2014-11-03 DIAGNOSIS — C50911 Malignant neoplasm of unspecified site of right female breast: Secondary | ICD-10-CM

## 2014-11-03 DIAGNOSIS — C50919 Malignant neoplasm of unspecified site of unspecified female breast: Secondary | ICD-10-CM

## 2014-11-03 LAB — CBC
HCT: 37.7 % (ref 35.0–47.0)
HEMOGLOBIN: 12.5 g/dL (ref 12.0–16.0)
MCH: 29.5 pg (ref 26.0–34.0)
MCHC: 33.1 g/dL (ref 32.0–36.0)
MCV: 89.3 fL (ref 80.0–100.0)
PLATELETS: 325 10*3/uL (ref 150–440)
RBC: 4.23 MIL/uL (ref 3.80–5.20)
RDW: 12.9 % (ref 11.5–14.5)
WBC: 7.6 10*3/uL (ref 3.6–11.0)

## 2014-11-03 LAB — COMPREHENSIVE METABOLIC PANEL
ALBUMIN: 3.9 g/dL (ref 3.5–5.0)
ALK PHOS: 67 U/L (ref 38–126)
ALT: 18 U/L (ref 14–54)
AST: 27 U/L (ref 15–41)
Anion gap: 8 (ref 5–15)
BUN: 19 mg/dL (ref 6–20)
CO2: 27 mmol/L (ref 22–32)
Calcium: 8.6 mg/dL — ABNORMAL LOW (ref 8.9–10.3)
Chloride: 100 mmol/L — ABNORMAL LOW (ref 101–111)
Creatinine, Ser: 0.96 mg/dL (ref 0.44–1.00)
GFR calc Af Amer: 59 mL/min — ABNORMAL LOW (ref >60)
GFR calc non Af Amer: 51 mL/min — ABNORMAL LOW (ref >60)
Glucose, Bld: 155 mg/dL — ABNORMAL HIGH (ref 65–99)
POTASSIUM: 3.4 mmol/L — AB (ref 3.5–5.1)
SODIUM: 135 mmol/L (ref 135–145)
TOTAL PROTEIN: 7.5 g/dL (ref 6.5–8.1)
Total Bilirubin: 0.3 mg/dL (ref 0.3–1.2)

## 2014-11-04 ENCOUNTER — Ambulatory Visit
Admission: RE | Admit: 2014-11-04 | Discharge: 2014-11-04 | Disposition: A | Discharge status: Home / Self Care | Payer: Medicare Other | Source: Ambulatory Visit | Attending: Radiation Oncology | Admitting: Radiation Oncology

## 2014-11-04 DIAGNOSIS — Z510 Encounter for antineoplastic radiation therapy: Secondary | ICD-10-CM | POA: Diagnosis not present

## 2014-11-05 ENCOUNTER — Ambulatory Visit
Admission: RE | Admit: 2014-11-05 | Discharge: 2014-11-05 | Disposition: A | Discharge status: Home / Self Care | Payer: Medicare Other | Source: Ambulatory Visit | Attending: Radiation Oncology | Admitting: Radiation Oncology

## 2014-11-05 DIAGNOSIS — Z510 Encounter for antineoplastic radiation therapy: Secondary | ICD-10-CM | POA: Diagnosis not present

## 2014-11-06 ENCOUNTER — Ambulatory Visit
Admission: RE | Admit: 2014-11-06 | Discharge: 2014-11-06 | Disposition: A | Discharge status: Home / Self Care | Payer: Medicare Other | Source: Ambulatory Visit | Attending: Radiation Oncology | Admitting: Radiation Oncology

## 2014-11-06 DIAGNOSIS — Z510 Encounter for antineoplastic radiation therapy: Secondary | ICD-10-CM | POA: Diagnosis not present

## 2014-11-07 ENCOUNTER — Ambulatory Visit
Admission: RE | Admit: 2014-11-07 | Discharge: 2014-11-07 | Disposition: A | Discharge status: Home / Self Care | Payer: Medicare Other | Source: Ambulatory Visit | Attending: Radiation Oncology | Admitting: Radiation Oncology

## 2014-11-07 DIAGNOSIS — Z510 Encounter for antineoplastic radiation therapy: Secondary | ICD-10-CM | POA: Diagnosis not present

## 2014-11-10 ENCOUNTER — Ambulatory Visit
Admission: RE | Admit: 2014-11-10 | Discharge: 2014-11-10 | Disposition: A | Discharge status: Home / Self Care | Payer: Medicare Other | Source: Ambulatory Visit | Attending: Radiation Oncology | Admitting: Radiation Oncology

## 2014-11-10 DIAGNOSIS — Z510 Encounter for antineoplastic radiation therapy: Secondary | ICD-10-CM | POA: Diagnosis not present

## 2014-11-11 ENCOUNTER — Ambulatory Visit
Admission: RE | Admit: 2014-11-11 | Discharge: 2014-11-11 | Disposition: A | Discharge status: Home / Self Care | Payer: Medicare Other | Source: Ambulatory Visit | Attending: Radiation Oncology | Admitting: Radiation Oncology

## 2014-11-11 DIAGNOSIS — Z510 Encounter for antineoplastic radiation therapy: Secondary | ICD-10-CM | POA: Diagnosis not present

## 2014-11-12 ENCOUNTER — Ambulatory Visit
Admission: RE | Admit: 2014-11-12 | Discharge: 2014-11-12 | Disposition: A | Discharge status: Home / Self Care | Payer: Medicare Other | Source: Ambulatory Visit | Attending: Radiation Oncology | Admitting: Radiation Oncology

## 2014-11-12 DIAGNOSIS — Z510 Encounter for antineoplastic radiation therapy: Secondary | ICD-10-CM | POA: Diagnosis not present

## 2014-11-13 ENCOUNTER — Ambulatory Visit
Admission: RE | Admit: 2014-11-13 | Discharge: 2014-11-13 | Disposition: A | Discharge status: Home / Self Care | Payer: Medicare Other | Source: Ambulatory Visit | Attending: Radiation Oncology | Admitting: Radiation Oncology

## 2014-11-13 DIAGNOSIS — Z510 Encounter for antineoplastic radiation therapy: Secondary | ICD-10-CM | POA: Diagnosis not present

## 2014-11-14 ENCOUNTER — Ambulatory Visit
Admission: RE | Admit: 2014-11-14 | Discharge: 2014-11-14 | Disposition: A | Discharge status: Home / Self Care | Payer: Medicare Other | Source: Ambulatory Visit | Attending: Radiation Oncology | Admitting: Radiation Oncology

## 2014-11-14 DIAGNOSIS — Z510 Encounter for antineoplastic radiation therapy: Secondary | ICD-10-CM | POA: Diagnosis not present

## 2014-11-17 ENCOUNTER — Ambulatory Visit
Admission: RE | Admit: 2014-11-17 | Discharge: 2014-11-17 | Disposition: A | Discharge status: Home / Self Care | Payer: Medicare Other | Source: Ambulatory Visit | Attending: Radiation Oncology | Admitting: Radiation Oncology

## 2014-11-17 ENCOUNTER — Inpatient Hospital Stay: Payer: Medicare Other

## 2014-11-17 DIAGNOSIS — C50211 Malignant neoplasm of upper-inner quadrant of right female breast: Secondary | ICD-10-CM | POA: Diagnosis not present

## 2014-11-17 DIAGNOSIS — C50911 Malignant neoplasm of unspecified site of right female breast: Secondary | ICD-10-CM

## 2014-11-17 DIAGNOSIS — Z510 Encounter for antineoplastic radiation therapy: Secondary | ICD-10-CM | POA: Diagnosis not present

## 2014-11-17 LAB — CBC
HCT: 35.9 % (ref 35.0–47.0)
Hemoglobin: 12.1 g/dL (ref 12.0–16.0)
MCH: 29.8 pg (ref 26.0–34.0)
MCHC: 33.6 g/dL (ref 32.0–36.0)
MCV: 88.6 fL (ref 80.0–100.0)
PLATELETS: 274 10*3/uL (ref 150–440)
RBC: 4.05 MIL/uL (ref 3.80–5.20)
RDW: 13.6 % (ref 11.5–14.5)
WBC: 7.9 10*3/uL (ref 3.6–11.0)

## 2014-11-18 ENCOUNTER — Ambulatory Visit
Admission: RE | Admit: 2014-11-18 | Discharge: 2014-11-18 | Disposition: A | Discharge status: Home / Self Care | Payer: Medicare Other | Source: Ambulatory Visit | Attending: Radiation Oncology | Admitting: Radiation Oncology

## 2014-11-18 ENCOUNTER — Ambulatory Visit: Payer: Medicare Other | Admitting: Radiation Oncology

## 2014-11-18 DIAGNOSIS — Z510 Encounter for antineoplastic radiation therapy: Secondary | ICD-10-CM | POA: Diagnosis not present

## 2014-11-19 ENCOUNTER — Encounter: Payer: Self-pay | Admitting: *Deleted

## 2014-11-19 ENCOUNTER — Ambulatory Visit
Admission: RE | Admit: 2014-11-19 | Discharge: 2014-11-19 | Disposition: A | Discharge status: Home / Self Care | Payer: Medicare Other | Source: Ambulatory Visit | Attending: Radiation Oncology | Admitting: Radiation Oncology

## 2014-11-19 DIAGNOSIS — Z510 Encounter for antineoplastic radiation therapy: Secondary | ICD-10-CM | POA: Diagnosis not present

## 2014-11-20 ENCOUNTER — Ambulatory Visit
Admission: RE | Admit: 2014-11-20 | Discharge: 2014-11-20 | Disposition: A | Discharge status: Home / Self Care | Payer: Medicare Other | Source: Ambulatory Visit | Attending: Radiation Oncology | Admitting: Radiation Oncology

## 2014-11-20 DIAGNOSIS — Z510 Encounter for antineoplastic radiation therapy: Secondary | ICD-10-CM | POA: Diagnosis not present

## 2014-11-21 ENCOUNTER — Ambulatory Visit
Admission: RE | Admit: 2014-11-21 | Discharge: 2014-11-21 | Disposition: A | Discharge status: Home / Self Care | Payer: Medicare Other | Source: Ambulatory Visit | Attending: Radiation Oncology | Admitting: Radiation Oncology

## 2014-11-21 DIAGNOSIS — Z510 Encounter for antineoplastic radiation therapy: Secondary | ICD-10-CM | POA: Diagnosis not present

## 2014-11-25 ENCOUNTER — Inpatient Hospital Stay
Admission: RE | Admit: 2014-11-25 | Discharge: 2014-11-25 | Disposition: A | Discharge status: Home / Self Care | Payer: Self-pay | Source: Ambulatory Visit | Attending: Radiation Oncology | Admitting: Radiation Oncology

## 2014-11-25 ENCOUNTER — Ambulatory Visit
Admission: RE | Admit: 2014-11-25 | Discharge: 2014-11-25 | Disposition: A | Discharge status: Home / Self Care | Payer: Medicare Other | Source: Ambulatory Visit | Attending: Radiation Oncology | Admitting: Radiation Oncology

## 2014-11-25 DIAGNOSIS — Z510 Encounter for antineoplastic radiation therapy: Secondary | ICD-10-CM | POA: Diagnosis not present

## 2014-11-26 ENCOUNTER — Ambulatory Visit
Admission: RE | Admit: 2014-11-26 | Discharge: 2014-11-26 | Disposition: A | Discharge status: Home / Self Care | Payer: Medicare Other | Source: Ambulatory Visit | Attending: Radiation Oncology | Admitting: Radiation Oncology

## 2014-11-26 DIAGNOSIS — Z510 Encounter for antineoplastic radiation therapy: Secondary | ICD-10-CM | POA: Diagnosis not present

## 2014-11-27 ENCOUNTER — Ambulatory Visit
Admission: RE | Admit: 2014-11-27 | Discharge: 2014-11-27 | Disposition: A | Discharge status: Home / Self Care | Payer: Medicare Other | Source: Ambulatory Visit | Attending: Radiation Oncology | Admitting: Radiation Oncology

## 2014-11-27 DIAGNOSIS — Z510 Encounter for antineoplastic radiation therapy: Secondary | ICD-10-CM | POA: Diagnosis not present

## 2014-11-28 ENCOUNTER — Ambulatory Visit
Admission: RE | Admit: 2014-11-28 | Discharge: 2014-11-28 | Disposition: A | Discharge status: Home / Self Care | Payer: Medicare Other | Source: Ambulatory Visit | Attending: Radiation Oncology | Admitting: Radiation Oncology

## 2014-11-28 DIAGNOSIS — Z510 Encounter for antineoplastic radiation therapy: Secondary | ICD-10-CM | POA: Diagnosis not present

## 2014-12-01 ENCOUNTER — Ambulatory Visit
Admission: RE | Admit: 2014-12-01 | Discharge: 2014-12-01 | Disposition: A | Discharge status: Home / Self Care | Payer: Medicare Other | Source: Ambulatory Visit | Attending: Radiation Oncology | Admitting: Radiation Oncology

## 2014-12-01 ENCOUNTER — Inpatient Hospital Stay: Payer: Medicare Other | Attending: Oncology

## 2014-12-01 DIAGNOSIS — Z923 Personal history of irradiation: Secondary | ICD-10-CM | POA: Diagnosis not present

## 2014-12-01 DIAGNOSIS — Z853 Personal history of malignant neoplasm of breast: Secondary | ICD-10-CM | POA: Diagnosis not present

## 2014-12-01 DIAGNOSIS — Z170 Estrogen receptor positive status [ER+]: Secondary | ICD-10-CM | POA: Insufficient documentation

## 2014-12-01 DIAGNOSIS — J449 Chronic obstructive pulmonary disease, unspecified: Secondary | ICD-10-CM | POA: Diagnosis not present

## 2014-12-01 DIAGNOSIS — C50911 Malignant neoplasm of unspecified site of right female breast: Secondary | ICD-10-CM

## 2014-12-01 DIAGNOSIS — Z7982 Long term (current) use of aspirin: Secondary | ICD-10-CM | POA: Insufficient documentation

## 2014-12-01 DIAGNOSIS — Z9223 Personal history of estrogen therapy: Secondary | ICD-10-CM | POA: Diagnosis not present

## 2014-12-01 DIAGNOSIS — Z79899 Other long term (current) drug therapy: Secondary | ICD-10-CM | POA: Insufficient documentation

## 2014-12-01 DIAGNOSIS — Z79811 Long term (current) use of aromatase inhibitors: Secondary | ICD-10-CM | POA: Diagnosis not present

## 2014-12-01 DIAGNOSIS — Z510 Encounter for antineoplastic radiation therapy: Secondary | ICD-10-CM | POA: Diagnosis not present

## 2014-12-01 DIAGNOSIS — M1990 Unspecified osteoarthritis, unspecified site: Secondary | ICD-10-CM | POA: Diagnosis not present

## 2014-12-01 DIAGNOSIS — C50211 Malignant neoplasm of upper-inner quadrant of right female breast: Secondary | ICD-10-CM | POA: Insufficient documentation

## 2014-12-01 LAB — CBC
HCT: 36 % (ref 35.0–47.0)
HEMOGLOBIN: 11.8 g/dL — AB (ref 12.0–16.0)
MCH: 28.9 pg (ref 26.0–34.0)
MCHC: 32.8 g/dL (ref 32.0–36.0)
MCV: 88 fL (ref 80.0–100.0)
PLATELETS: 300 10*3/uL (ref 150–440)
RBC: 4.1 MIL/uL (ref 3.80–5.20)
RDW: 13.2 % (ref 11.5–14.5)
WBC: 7.4 10*3/uL (ref 3.6–11.0)

## 2014-12-02 ENCOUNTER — Ambulatory Visit
Admission: RE | Admit: 2014-12-02 | Discharge: 2014-12-02 | Disposition: A | Discharge status: Home / Self Care | Payer: Medicare Other | Source: Ambulatory Visit | Attending: Radiation Oncology | Admitting: Radiation Oncology

## 2014-12-02 DIAGNOSIS — Z510 Encounter for antineoplastic radiation therapy: Secondary | ICD-10-CM | POA: Diagnosis not present

## 2014-12-03 ENCOUNTER — Inpatient Hospital Stay
Admission: RE | Admit: 2014-12-03 | Discharge: 2014-12-03 | Disposition: A | Discharge status: Home / Self Care | Payer: Self-pay | Source: Ambulatory Visit | Attending: Radiation Oncology | Admitting: Radiation Oncology

## 2014-12-03 DIAGNOSIS — Z510 Encounter for antineoplastic radiation therapy: Secondary | ICD-10-CM | POA: Diagnosis not present

## 2014-12-04 ENCOUNTER — Ambulatory Visit
Admission: RE | Admit: 2014-12-04 | Discharge: 2014-12-04 | Disposition: A | Discharge status: Home / Self Care | Payer: Medicare Other | Source: Ambulatory Visit | Attending: Radiation Oncology | Admitting: Radiation Oncology

## 2014-12-04 DIAGNOSIS — Z510 Encounter for antineoplastic radiation therapy: Secondary | ICD-10-CM | POA: Diagnosis not present

## 2014-12-05 ENCOUNTER — Other Ambulatory Visit: Payer: Self-pay | Admitting: *Deleted

## 2014-12-05 ENCOUNTER — Inpatient Hospital Stay
Admission: RE | Admit: 2014-12-05 | Discharge: 2014-12-05 | Disposition: A | Discharge status: Home / Self Care | Payer: Self-pay | Source: Ambulatory Visit | Attending: Radiation Oncology | Admitting: Radiation Oncology

## 2014-12-05 DIAGNOSIS — Z510 Encounter for antineoplastic radiation therapy: Secondary | ICD-10-CM | POA: Diagnosis not present

## 2014-12-05 DIAGNOSIS — C50911 Malignant neoplasm of unspecified site of right female breast: Secondary | ICD-10-CM

## 2014-12-08 ENCOUNTER — Encounter: Payer: Self-pay | Admitting: Oncology

## 2014-12-08 ENCOUNTER — Ambulatory Visit: Payer: Medicare Other | Admitting: Oncology

## 2014-12-08 ENCOUNTER — Other Ambulatory Visit: Payer: Medicare Other

## 2014-12-08 ENCOUNTER — Inpatient Hospital Stay: Payer: Medicare Other

## 2014-12-08 ENCOUNTER — Inpatient Hospital Stay (HOSPITAL_BASED_OUTPATIENT_CLINIC_OR_DEPARTMENT_OTHER): Payer: Medicare Other | Admitting: Oncology

## 2014-12-08 ENCOUNTER — Ambulatory Visit
Admission: RE | Admit: 2014-12-08 | Discharge: 2014-12-08 | Disposition: A | Discharge status: Home / Self Care | Payer: Medicare Other | Source: Ambulatory Visit | Attending: Radiation Oncology | Admitting: Radiation Oncology

## 2014-12-08 VITALS — BP 165/80 | HR 60 | Temp 98.3°F | Resp 18 | Ht 65.0 in | Wt 181.0 lb

## 2014-12-08 DIAGNOSIS — Z923 Personal history of irradiation: Secondary | ICD-10-CM

## 2014-12-08 DIAGNOSIS — C50919 Malignant neoplasm of unspecified site of unspecified female breast: Secondary | ICD-10-CM

## 2014-12-08 DIAGNOSIS — Z853 Personal history of malignant neoplasm of breast: Secondary | ICD-10-CM | POA: Diagnosis not present

## 2014-12-08 DIAGNOSIS — Z510 Encounter for antineoplastic radiation therapy: Secondary | ICD-10-CM | POA: Diagnosis not present

## 2014-12-08 DIAGNOSIS — C50911 Malignant neoplasm of unspecified site of right female breast: Secondary | ICD-10-CM

## 2014-12-08 DIAGNOSIS — C50211 Malignant neoplasm of upper-inner quadrant of right female breast: Secondary | ICD-10-CM | POA: Diagnosis not present

## 2014-12-08 DIAGNOSIS — J449 Chronic obstructive pulmonary disease, unspecified: Secondary | ICD-10-CM

## 2014-12-08 DIAGNOSIS — Z79899 Other long term (current) drug therapy: Secondary | ICD-10-CM

## 2014-12-08 DIAGNOSIS — Z7982 Long term (current) use of aspirin: Secondary | ICD-10-CM

## 2014-12-08 DIAGNOSIS — Z170 Estrogen receptor positive status [ER+]: Secondary | ICD-10-CM

## 2014-12-08 DIAGNOSIS — M1990 Unspecified osteoarthritis, unspecified site: Secondary | ICD-10-CM

## 2014-12-08 DIAGNOSIS — Z9223 Personal history of estrogen therapy: Secondary | ICD-10-CM

## 2014-12-08 DIAGNOSIS — Z79811 Long term (current) use of aromatase inhibitors: Secondary | ICD-10-CM | POA: Diagnosis not present

## 2014-12-08 LAB — CBC WITH DIFFERENTIAL/PLATELET
BASOS ABS: 0.2 10*3/uL — AB (ref 0–0.1)
BASOS PCT: 2 %
EOS ABS: 0.2 10*3/uL (ref 0–0.7)
EOS PCT: 3 %
HCT: 35.4 % (ref 35.0–47.0)
Hemoglobin: 11.5 g/dL — ABNORMAL LOW (ref 12.0–16.0)
Lymphocytes Relative: 21 %
Lymphs Abs: 1.7 10*3/uL (ref 1.0–3.6)
MCH: 28.5 pg (ref 26.0–34.0)
MCHC: 32.5 g/dL (ref 32.0–36.0)
MCV: 87.5 fL (ref 80.0–100.0)
MONO ABS: 1 10*3/uL — AB (ref 0.2–0.9)
Monocytes Relative: 12 %
Neutro Abs: 5.2 10*3/uL (ref 1.4–6.5)
Neutrophils Relative %: 62 %
Platelets: 286 10*3/uL (ref 150–440)
RBC: 4.04 MIL/uL (ref 3.80–5.20)
RDW: 13.7 % (ref 11.5–14.5)
WBC: 8.3 10*3/uL (ref 3.6–11.0)

## 2014-12-08 LAB — COMPREHENSIVE METABOLIC PANEL
ALT: 16 U/L (ref 14–54)
ANION GAP: 6 (ref 5–15)
AST: 23 U/L (ref 15–41)
Albumin: 4 g/dL (ref 3.5–5.0)
Alkaline Phosphatase: 63 U/L (ref 38–126)
BILIRUBIN TOTAL: 0.6 mg/dL (ref 0.3–1.2)
BUN: 22 mg/dL — AB (ref 6–20)
CO2: 28 mmol/L (ref 22–32)
CREATININE: 0.94 mg/dL (ref 0.44–1.00)
Calcium: 8.8 mg/dL — ABNORMAL LOW (ref 8.9–10.3)
Chloride: 101 mmol/L (ref 101–111)
GFR calc Af Amer: >60 mL/min (ref >60)
GFR calc non Af Amer: 53 mL/min — ABNORMAL LOW (ref >60)
Glucose, Bld: 122 mg/dL — ABNORMAL HIGH (ref 65–99)
Potassium: 3.8 mmol/L (ref 3.5–5.1)
Sodium: 135 mmol/L (ref 135–145)
Total Protein: 7.4 g/dL (ref 6.5–8.1)

## 2014-12-08 MED ORDER — LETROZOLE 2.5 MG PO TABS: 2.5000 mg | ORAL_TABLET | Freq: Every day | ORAL | Status: DC

## 2014-12-08 MED ORDER — CALCIUM CARBONATE-VITAMIN D 500-200 MG-UNIT PO TABS: 1.0000 | ORAL_TABLET | Freq: Two times a day (BID) | ORAL | Status: DC

## 2014-12-09 ENCOUNTER — Inpatient Hospital Stay
Admission: RE | Admit: 2014-12-09 | Discharge: 2014-12-09 | Disposition: A | Discharge status: Home / Self Care | Payer: Self-pay | Source: Ambulatory Visit | Attending: Radiation Oncology | Admitting: Radiation Oncology

## 2014-12-09 ENCOUNTER — Ambulatory Visit
Admission: RE | Admit: 2014-12-09 | Discharge: 2014-12-09 | Disposition: A | Discharge status: Home / Self Care | Payer: Medicare Other | Source: Ambulatory Visit | Attending: Radiation Oncology | Admitting: Radiation Oncology

## 2014-12-09 DIAGNOSIS — Z510 Encounter for antineoplastic radiation therapy: Secondary | ICD-10-CM | POA: Diagnosis not present

## 2014-12-10 ENCOUNTER — Inpatient Hospital Stay
Admission: RE | Admit: 2014-12-10 | Discharge: 2014-12-10 | Disposition: A | Discharge status: Home / Self Care | Payer: Self-pay | Source: Ambulatory Visit | Attending: Radiation Oncology | Admitting: Radiation Oncology

## 2014-12-10 DIAGNOSIS — Z510 Encounter for antineoplastic radiation therapy: Secondary | ICD-10-CM | POA: Diagnosis not present

## 2014-12-11 ENCOUNTER — Inpatient Hospital Stay
Admission: RE | Admit: 2014-12-11 | Discharge: 2014-12-11 | Disposition: A | Discharge status: Home / Self Care | Payer: Self-pay | Source: Ambulatory Visit | Attending: Radiation Oncology | Admitting: Radiation Oncology

## 2014-12-11 DIAGNOSIS — Z510 Encounter for antineoplastic radiation therapy: Secondary | ICD-10-CM | POA: Diagnosis not present

## 2014-12-12 ENCOUNTER — Ambulatory Visit
Admission: RE | Admit: 2014-12-12 | Discharge: 2014-12-12 | Disposition: A | Discharge status: Home / Self Care | Payer: Medicare Other | Source: Ambulatory Visit | Attending: Radiation Oncology | Admitting: Radiation Oncology

## 2014-12-12 DIAGNOSIS — Z510 Encounter for antineoplastic radiation therapy: Secondary | ICD-10-CM | POA: Diagnosis not present

## 2014-12-14 ENCOUNTER — Encounter: Payer: Self-pay | Admitting: Oncology

## 2014-12-14 NOTE — Progress Notes (Signed)
[place] @ [facility] [phone]  Fax:([phone]) [place]: [birth year]  MR#: [id number]  UUV#:[id number]  Patient Care Team: [name], MD as PCP - General (Family Medicine) Seeplaputhur Robinette Haines, MD (General Surgery) Forest Gleason, MD (Oncology)  CHIEF COMPLAINT:  Chief Complaint  Patient presents with  . Follow-up    breast ca    Oncology History   1.Abnormal mammogram followed by biopsy of the right breast.  A 2:00 position patient had an abnormal 41m mass which has been biopsied.  Was positive for invasive carcinoma.  Estrogen and progesterone receptor and HER-2/neu receptor pending (March, 2016) 2.  Left breast carcinoma breast 22 years ago patient had lumpectomy radiation therapy and 2 years of tamoxifen. 3.  Patient underwent wide local excision on April 29 and sentinel lymph node biopsy.  Low-grade tumor estrogen and progesterone receptor positive HER-2 receptor negative T1 cN0 M0.     Carcinoma of right breast   10/12/2014 Initial Diagnosis Carcinoma of left breast    No flowsheet data found.  INTERVAL HISTORY: 81year old lady underwent wide local excision of the right breast.  1.4 cm tumor was found which was estrogen and progesterone receptor positive.  HER-2 receptor negative.  Margins were negative.  There was no lymphovascular invasion.  Patient is here to discuss local as well as systemic adjuvant therapy. She is tolerating letrozole.  No bony pains.  Here for further follow-up and treatment consideration REVIEW OF SYSTEMS:   GENERAL:  Feels good.  Active.  No fevers, sweats or weight loss. PERFORMANCE STATUS (ECOG): 01 HEENT:  No visual changes, runny nose, sore throat, mouth sores or tenderness. Lungs: No shortness of breath or cough.  No hemoptysis. Cardiac:  No chest pain, palpitations, orthopnea, or PND. GI:  No nausea, vomiting, diarrhea, constipation, melena or hematochezia. GU:  No urgency, frequency, dysuria, or  hematuria. Musculoskeletal:  No back pain.  No joint pain.  No muscle tenderness. Extremities:  No pain or swelling. Skin:  No rashes or skin changes. Neuro:  No headache, numbness or weakness, balance or coordination issues. Endocrine:  No diabetes, thyroid issues, hot flashes or night sweats. Psych:  No mood changes, depression or anxiety. Pain:  No focal pain. Review of systems:  All other systems reviewed and found to be negative.  As per HPI. Otherwise, a complete review of systems is negatve.  PAST MEDICAL HISTORY: Past Medical History  Diagnosis Date  . Arthritis   . Pneumonia July 2014  . COPD (chronic obstructive pulmonary disease)   . Heart valve problem     leaking  . Cancer 11-11-91    left breast   . Cancer 08-12-14    right breast, ER/PR positive, Her2 negative  . Breast cancer   . Carcinoma of left breast     Left breast carcinoma; patient had lumpectomy radiation therapy   . Carcinoma of right breast 10/12/2014    Abnormal mammogram followed by biopsy of the right breast.  A 2:00 position patient had an abnormal 846mmass which has been biopsied.  Was positive for invasive carcinoma.  Estrogen and progesterone receptor and HER-2/neu receptor pending (March, 2016)  . History of colonoscopy 2012  . History of mammogram 2016    PAST SURGICAL HISTORY: Past Surgical History  Procedure Laterality Date  . Colonoscopy    . Cholecystectomy  02/23/1972  . Breast surgery Left 11-11-91    lumpectomy with radiation, Tamoxifen for 2 years.Dr. CrSharlet Salina. Breast  surgery Right 09/09/14    RIGHT BREAST WIDE EXCISION     FAMILY HISTORY Family History  Problem Relation Age of Onset  . Colon cancer Mother 56  . Colon cancer Brother 34  . Breast cancer Sister 38        ADVANCED DIRECTIVES: Patient does have advanced medical care directive   HEALTH MAINTENANCE: History  Substance Use Topics  . Smoking status: Never Smoker   . Smokeless tobacco: Never Used  . Alcohol  Use: No      Allergies  Allergen Reactions  . Amoxicillin-Pot Clavulanate Nausea And Vomiting  . Other Nausea And Vomiting    Current Outpatient Prescriptions  Medication Sig Dispense Refill  . acetaminophen (TYLENOL) 650 MG CR tablet Take by mouth.    . Albuterol Sulfate (PROAIR HFA IN) Inhale into the lungs as needed.    Marland Kitchen amLODipine (NORVASC) 5 MG tablet Take 5 mg by mouth daily.     Marland Kitchen aspirin 81 MG tablet Take 81 mg by mouth daily.    Marland Kitchen LORazepam (ATIVAN) 1 MG tablet Take 1 tablet (1 mg total) by mouth at bedtime as needed for anxiety. 30 tablet 3  . losartan-hydrochlorothiazide (HYZAAR) 50-12.5 MG per tablet Take 1 tablet by mouth daily.     . Magnesium 250 MG TABS Take 1 tablet by mouth daily. As needed for indigestion    . omeprazole (PRILOSEC) 40 MG capsule Take 40 mg by mouth daily.    . simvastatin (ZOCOR) 20 MG tablet Take 20 mg by mouth daily at 6 PM.     . calcium-vitamin D (OSCAL WITH D) 500-200 MG-UNIT per tablet Take 1 tablet by mouth 2 (two) times daily. 60 tablet 6  . fluticasone (FLONASE) 50 MCG/ACT nasal spray Place 1 spray into both nostrils daily. As needed for seasonal allergies  11  . letrozole (FEMARA) 2.5 MG tablet Take 1 tablet (2.5 mg total) by mouth daily. 30 tablet 6  . metoprolol succinate (TOPROL-XL) 50 MG 24 hr tablet Take 50 mg by mouth daily.      No current facility-administered medications for this visit.    OBJECTIVE:  Filed Vitals:   12/08/14 1439  BP: 165/80  Pulse: 60  Temp: 98.3 F (36.8 C)  Resp: 18     Body mass index is 30.12 kg/(m^2).    ECOG FS:1 - Symptomatic but completely ambulatory  PHYSICAL EXAM: GENERAL:  Well developed, well nourished, sitting comfortably in the exam room in no acute distress. MENTAL STATUS:  Alert and oriented to person, place and time. HEAD:    Normocephalic, atraumatic, face symmetric, no Cushingoid features. EYES.  Pupils equal round and reactive to light and accomodation.  No conjunctivitis or  scleral icterus. ENT:  Oropharynx clear without lesion.  Tongue normal. Mucous membranes moist.  RESPIRATORY:  Clear to auscultation without rales, wheezes or rhonchi. CARDIOVASCULAR:  Regular rate and rhythm without murmur, rub or gallop. BREAST:  Right breast without masses,.  Right breast wound is healing well.  There is seroma which is being followed by Dr. Jamal Collin. no skin changes or nipple discharge.  Left breast without masses, skin changes or nipple discharge. ABDOMEN:  Soft, non-tender, with active bowel sounds, and no hepatosplenomegaly.  No masses. BACK:  No CVA tenderness.  No tenderness on percussion of the back or rib cage. SKIN:  No rashes, ulcers or lesions. EXTREMITIES: No edema, no skin discoloration or tenderness.  No palpable cords. LYMPH NODES: No palpable cervical, supraclavicular, axillary or inguinal adenopathy  NEUROLOGICAL: Unremarkable. PSYCH:  Appropriate.   LAB RESULTS:  Appointment on 12/08/2014  Component Date Value Ref Range Status  . WBC 12/08/2014 8.3  3.6 - 11.0 K/uL Final  . RBC 12/08/2014 4.04  3.80 - 5.20 MIL/uL Final  . Hemoglobin 12/08/2014 11.5* 12.0 - 16.0 g/dL Final  . HCT 12/08/2014 35.4  35.0 - 47.0 % Final  . MCV 12/08/2014 87.5  80.0 - 100.0 fL Final  . MCH 12/08/2014 28.5  26.0 - 34.0 pg Final  . MCHC 12/08/2014 32.5  32.0 - 36.0 g/dL Final  . RDW 12/08/2014 13.7  11.5 - 14.5 % Final  . Platelets 12/08/2014 286  150 - 440 K/uL Final  . Neutrophils Relative % 12/08/2014 62   Final  . Neutro Abs 12/08/2014 5.2  1.4 - 6.5 K/uL Final  . Lymphocytes Relative 12/08/2014 21   Final  . Lymphs Abs 12/08/2014 1.7  1.0 - 3.6 K/uL Final  . Monocytes Relative 12/08/2014 12   Final  . Monocytes Absolute 12/08/2014 1.0* 0.2 - 0.9 K/uL Final  . Eosinophils Relative 12/08/2014 3   Final  . Eosinophils Absolute 12/08/2014 0.2  0 - 0.7 K/uL Final  . Basophils Relative 12/08/2014 2   Final  . Basophils Absolute 12/08/2014 0.2* 0 - 0.1 K/uL Final  .  Sodium 12/08/2014 135  135 - 145 mmol/L Final  . Potassium 12/08/2014 3.8  3.5 - 5.1 mmol/L Final  . Chloride 12/08/2014 101  101 - 111 mmol/L Final  . CO2 12/08/2014 28  22 - 32 mmol/L Final  . Glucose, Bld 12/08/2014 122* 65 - 99 mg/dL Final  . BUN 12/08/2014 22* 6 - 20 mg/dL Final  . Creatinine, Ser 12/08/2014 0.94  0.44 - 1.00 mg/dL Final  . Calcium 12/08/2014 8.8* 8.9 - 10.3 mg/dL Final  . Total Protein 12/08/2014 7.4  6.5 - 8.1 g/dL Final  . Albumin 12/08/2014 4.0  3.5 - 5.0 g/dL Final  . AST 12/08/2014 23  15 - 41 U/L Final  . ALT 12/08/2014 16  14 - 54 U/L Final  . Alkaline Phosphatase 12/08/2014 63  38 - 126 U/L Final  . Total Bilirubin 12/08/2014 0.6  0.3 - 1.2 mg/dL Final  . GFR calc non Af Amer 12/08/2014 53* >60 mL/min Final  . GFR calc Af Amer 12/08/2014 >60  >60 mL/min Final   Comment: (NOTE) The eGFR has been calculated using the CKD EPI equation. This calculation has not been validated in all clinical situations. eGFR's persistently <60 mL/min signify possible Chronic Kidney Disease.   . Anion gap 12/08/2014 6  5 - 15 Final    Lab Results  Component Value Date   LABCA2 22.6 09/01/2014   No results found for: CA199 Lab Results  Component Value Date   CEA 1.6 09/01/2014   No results found for: PSA No results found for: CA125   STUDIES: No results found.  ASSESSMENT: 1.  Diagnosis of right breast cancer stage IC estrogen receptor positive progesterone receptor positive had lumpectomy and axillary lymph node evaluation with sentinel lymph node biopsy.  Estrogen and progesterone receptor positive.  (Diagnosis in April of 2016) 2.  Previous history of carcinoma of left breast 22 years ago status post lumpectomy radiation and tamoxifen for 2 years 3.  Recent is on letrozole or tolerating very well.   MEDICAL DECISION MAKING:  I had detailed discussion with patient.  Considering patient's overall age 17 as well as her desire not to pursue any chemotherapy and  considering the fact that patient has a low-grade tumor and hormone receptor positive we are not ordering any Oncotype DX. Patient will proceed with local radiation therapy After radiation therapy is finished patient would be evaluated for anti-hormonal therapy with letrozole Baseline bone density study will be obtained Risk and benefit of letrozole therapy including osteoporosis and need for bone density calcium and vitamin D had been discussed. I discussed this case with Dr. Jamal Collin as well as with Dr. Baruch Gouty and they are in agreement with the plan  Patient expressed understanding and was in agreement with this plan. She also understands that She can call clinic at any time with any questions, concerns, or complaints.    Carcinoma of left breast   Staging form: Breast, AJCC 7th Edition     Clinical stage from 09/19/2014: Stage IA (yT1c, N0, M0) - Marni Griffon, MD   12/14/2014 11:00 AM

## 2014-12-23 ENCOUNTER — Ambulatory Visit (INDEPENDENT_AMBULATORY_CARE_PROVIDER_SITE_OTHER): Payer: Medicare Other | Admitting: General Surgery

## 2014-12-23 ENCOUNTER — Encounter: Payer: Self-pay | Admitting: General Surgery

## 2014-12-23 VITALS — BP 124/72 | HR 72 | Resp 12 | Ht 66.0 in | Wt 179.0 lb

## 2014-12-23 DIAGNOSIS — C50211 Malignant neoplasm of upper-inner quadrant of right female breast: Secondary | ICD-10-CM

## 2014-12-23 NOTE — Progress Notes (Signed)
Patient ID: Kristy Hardy, female   DOB: 1924-09-14, 81 y.o.   MRN: [id number]  Chief Complaint  Patient presents with  . Follow-up    HPI Kristy Hardy is a 81 y.o. female.  today for her post op right breast wide excison done on 09/09/14. Patient states she is doing well.She finished radiation on 12/12/14.  HPI  Past Medical History  Diagnosis Date  . Arthritis   . Pneumonia July 2014  . COPD (chronic obstructive pulmonary disease)   . Heart valve problem     leaking  . Cancer 11-11-91    left breast   . Cancer 08-12-14    right breast, ER/PR positive, Her2 negative  . Breast cancer   . Carcinoma of left breast     Left breast carcinoma; patient had lumpectomy radiation therapy   . Carcinoma of right breast 10/12/2014    Abnormal mammogram followed by biopsy of the right breast.  A 2:00 position patient had an abnormal 86m mass which has been biopsied.  Was positive for invasive carcinoma.  Estrogen and progesterone receptor and HER-2/neu receptor pending (March, 2016)  . History of colonoscopy 2012  . History of mammogram 2016    Past Surgical History  Procedure Laterality Date  . Colonoscopy    . Cholecystectomy  02/23/1972  . Breast surgery Left 11-11-91    lumpectomy with radiation, Tamoxifen for 2 years.Dr. CSharlet Salina . Breast surgery Right 09/09/14    RIGHT BREAST WIDE EXCISION     Family History  Problem Relation Age of Onset  . Colon cancer Mother 644 . Colon cancer Brother 722 . Breast cancer Sister 361   Social History History  Substance Use Topics  . Smoking status: Never Smoker   . Smokeless tobacco: Never Used  . Alcohol Use: No    Allergies  Allergen Reactions  . Amoxicillin-Pot Clavulanate Nausea And Vomiting  . Other Nausea And Vomiting    Current Outpatient Prescriptions  Medication Sig Dispense Refill  . acetaminophen (TYLENOL) 650 MG CR tablet Take by mouth.    . Albuterol Sulfate (PROAIR HFA IN) Inhale into the lungs as  needed.    .Marland KitchenamLODipine (NORVASC) 5 MG tablet Take 5 mg by mouth daily.     .Marland Kitchenaspirin 81 MG tablet Take 81 mg by mouth daily.    . calcium-vitamin D (OSCAL WITH D) 500-200 MG-UNIT per tablet Take 1 tablet by mouth 2 (two) times daily. 60 tablet 6  . fluticasone (FLONASE) 50 MCG/ACT nasal spray Place 1 spray into both nostrils daily. As needed for seasonal allergies  11  . letrozole (FEMARA) 2.5 MG tablet Take 1 tablet (2.5 mg total) by mouth daily. 30 tablet 6  . LORazepam (ATIVAN) 1 MG tablet Take 1 tablet (1 mg total) by mouth at bedtime as needed for anxiety. 30 tablet 3  . losartan-hydrochlorothiazide (HYZAAR) 50-12.5 MG per tablet Take 1 tablet by mouth daily.     . Magnesium 250 MG TABS Take 1 tablet by mouth daily. As needed for indigestion    . metoprolol succinate (TOPROL-XL) 50 MG 24 hr tablet Take 50 mg by mouth daily.     .Marland Kitchenomeprazole (PRILOSEC) 40 MG capsule Take 40 mg by mouth daily.    . simvastatin (ZOCOR) 20 MG tablet Take 20 mg by mouth daily at 6 PM.      No current facility-administered medications for this visit.    Review of Systems Review of Systems  Constitutional:  Negative.   Respiratory: Negative.   Cardiovascular: Negative.     Blood pressure 124/72, pulse 72, resp. rate 12, height _0  (1.676 m), weight 179 lb (81.194 kg).  Physical Exam Physical Exam  Constitutional: She is oriented to person, place, and time. She appears well-developed and well-nourished.  HENT:  Mouth/Throat: Oropharynx is clear and moist.  Eyes: Conjunctivae are normal. No scleral icterus.  Neck: Neck supple.  Cardiovascular: Normal rate, regular rhythm and normal heart sounds.   Pulmonary/Chest: Effort normal and breath sounds normal. Right breast exhibits skin change (skin redness noted at site of right lumpectomy due to recent radiation and a boost to that area. ). Right breast exhibits no inverted nipple, no mass, no nipple discharge and no tenderness. Left breast exhibits no  inverted nipple, no mass, no nipple discharge, no skin change and no tenderness.  Abdominal: Soft. Bowel sounds are normal. There is no hepatomegaly. There is no tenderness.  Lymphadenopathy:    She has no cervical adenopathy.  Neurological: She is alert and oriented to person, place, and time.  Skin: Skin is warm and dry.    Data Reviewed   Assessment    CA right breast T1c,N0. ER/PR pos and her 2 neg. Pt currently on Lerazole- doing well    Plan    42mof/u with bil diagnostic mammogram     PCP:  HDannial Monarch8/06/2014, 11:23 AM

## 2014-12-23 NOTE — Patient Instructions (Signed)
Follow up in 6 months for bilateral diagnostic mammogram.

## 2015-01-14 ENCOUNTER — Ambulatory Visit
Admission: RE | Admit: 2015-01-14 | Discharge: 2015-01-14 | Disposition: A | Discharge status: Home / Self Care | Payer: Medicare Other | Source: Ambulatory Visit | Attending: Radiation Oncology | Admitting: Radiation Oncology

## 2015-01-14 ENCOUNTER — Encounter: Payer: Self-pay | Admitting: Radiation Oncology

## 2015-01-14 VITALS — BP 134/66 | HR 62 | Temp 96.6°F | Resp 18 | Wt 177.5 lb

## 2015-01-14 DIAGNOSIS — C50911 Malignant neoplasm of unspecified site of right female breast: Secondary | ICD-10-CM

## 2015-01-14 NOTE — Progress Notes (Signed)
Radiation Oncology Follow up Note  Name: Kristy Hardy   Date:   01/14/2015 MRN:  [id number] DOB: 10-21-1924    This 81 y.o. female presents to the clinic today for follow-up for breast cancer stage I ER/PR positive HER-2/neu negative.  REFERRING PROVIDER: [name], MD  HPI: Patient is a 81 year old female now 1 month out having completed radiation therapy to her right breast for stage I (T1 CN 0 M0) ER/PR positive HER-2/neu negative invasive mammary carcinoma status post wide local excision and sentinel node biopsy. She seen today in routine follow-up and is doing well. She specifically denies breast tenderness cough or bone pain. She has been started on letrozole is tolerating that well without side effect..  COMPLICATIONS OF TREATMENT: none  FOLLOW UP COMPLIANCE: keeps appointments   PHYSICAL EXAM:  BP 134/66 mmHg  Pulse 62  Temp(Src) 96.6 F (35.9 C)  Resp 18  Wt 177 lb 7.5 oz (80.5 kg) Lungs are clear to A&P cardiac examination essentially unremarkable with regular rate and rhythm. No dominant mass or nodularity is noted in either breast in 2 positions examined. Incision is well-healed. No axillary or supraclavicular adenopathy is appreciated. Cosmetic result is excellent. The right breast is somewhat smaller than the left breast although the cosmetic result is still good to excellent. Well-developed well-nourished patient in NAD. HEENT reveals PERLA, EOMI, discs not visualized.  Oral cavity is clear. No oral mucosal lesions are identified. Neck is clear without evidence of cervical or supraclavicular adenopathy. Lungs are clear to A&P. Cardiac examination is essentially unremarkable with regular rate and rhythm without murmur rub or thrill. Abdomen is benign with no organomegaly or masses noted. Motor sensory and DTR levels are equal and symmetric in the upper and lower extremities. Cranial nerves II through XII are grossly intact. Proprioception is intact. No peripheral  adenopathy or edema is identified. No motor or sensory levels are noted. Crude visual fields are within normal range.   RADIOLOGY RESULTS: No current films to review  PLAN: Present time she is doing well 1 month out from whole breast radiation. I'm please were overall progress. She is tolerating her aromatase inhibitor well without side effect. I've asked to see her back in 4-5 months for follow-up. She knows to call sooner with any concerns.  I would like to take this opportunity for allowing me to participate in the care of your patient.Armstead Peaks., MD

## 2015-02-02 ENCOUNTER — Telehealth: Payer: Self-pay | Admitting: *Deleted

## 2015-02-02 DIAGNOSIS — C50919 Malignant neoplasm of unspecified site of unspecified female breast: Secondary | ICD-10-CM

## 2015-02-02 NOTE — Telephone Encounter (Signed)
Has no energy at all for past 2 weeks. 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 Has appt 10/10 for fu

## 2015-02-02 NOTE — Telephone Encounter (Signed)
Pt agrees to come in tomorrow  Per request of Dr Oliva Bustard to see pt this week

## 2015-02-03 ENCOUNTER — Inpatient Hospital Stay: Payer: Medicare Other | Attending: Oncology | Admitting: Oncology

## 2015-02-03 ENCOUNTER — Ambulatory Visit: Payer: Medicare Other

## 2015-02-03 ENCOUNTER — Inpatient Hospital Stay: Payer: Medicare Other

## 2015-02-03 ENCOUNTER — Encounter: Payer: Self-pay | Admitting: Oncology

## 2015-02-03 VITALS — BP 134/62 | HR 66 | Temp 96.9°F | Resp 18 | Wt 180.0 lb

## 2015-02-03 DIAGNOSIS — Z9223 Personal history of estrogen therapy: Secondary | ICD-10-CM | POA: Diagnosis not present

## 2015-02-03 DIAGNOSIS — R531 Weakness: Secondary | ICD-10-CM | POA: Insufficient documentation

## 2015-02-03 DIAGNOSIS — J449 Chronic obstructive pulmonary disease, unspecified: Secondary | ICD-10-CM | POA: Insufficient documentation

## 2015-02-03 DIAGNOSIS — Z7982 Long term (current) use of aspirin: Secondary | ICD-10-CM | POA: Diagnosis not present

## 2015-02-03 DIAGNOSIS — Z853 Personal history of malignant neoplasm of breast: Secondary | ICD-10-CM | POA: Insufficient documentation

## 2015-02-03 DIAGNOSIS — Z923 Personal history of irradiation: Secondary | ICD-10-CM | POA: Diagnosis not present

## 2015-02-03 DIAGNOSIS — M1990 Unspecified osteoarthritis, unspecified site: Secondary | ICD-10-CM | POA: Insufficient documentation

## 2015-02-03 DIAGNOSIS — Z170 Estrogen receptor positive status [ER+]: Secondary | ICD-10-CM | POA: Diagnosis not present

## 2015-02-03 DIAGNOSIS — Z79811 Long term (current) use of aromatase inhibitors: Secondary | ICD-10-CM | POA: Diagnosis not present

## 2015-02-03 DIAGNOSIS — Z79899 Other long term (current) drug therapy: Secondary | ICD-10-CM | POA: Insufficient documentation

## 2015-02-03 DIAGNOSIS — C50911 Malignant neoplasm of unspecified site of right female breast: Secondary | ICD-10-CM

## 2015-02-03 DIAGNOSIS — C50211 Malignant neoplasm of upper-inner quadrant of right female breast: Secondary | ICD-10-CM | POA: Insufficient documentation

## 2015-02-03 DIAGNOSIS — C50919 Malignant neoplasm of unspecified site of unspecified female breast: Secondary | ICD-10-CM

## 2015-02-03 DIAGNOSIS — Z1211 Encounter for screening for malignant neoplasm of colon: Secondary | ICD-10-CM | POA: Diagnosis not present

## 2015-02-03 DIAGNOSIS — D500 Iron deficiency anemia secondary to blood loss (chronic): Secondary | ICD-10-CM | POA: Insufficient documentation

## 2015-02-03 DIAGNOSIS — R252 Cramp and spasm: Secondary | ICD-10-CM | POA: Insufficient documentation

## 2015-02-03 LAB — CBC WITH DIFFERENTIAL/PLATELET
Basophils Absolute: 0.3 10*3/uL — ABNORMAL HIGH (ref 0–0.1)
Basophils Relative: 5 %
EOS ABS: 0.3 10*3/uL (ref 0–0.7)
EOS PCT: 4 %
HCT: 30.5 % — ABNORMAL LOW (ref 35.0–47.0)
Hemoglobin: 10.1 g/dL — ABNORMAL LOW (ref 12.0–16.0)
Lymphocytes Relative: 19 %
Lymphs Abs: 1.4 10*3/uL (ref 1.0–3.6)
MCH: 27 pg (ref 26.0–34.0)
MCHC: 33.3 g/dL (ref 32.0–36.0)
MCV: 81.1 fL (ref 80.0–100.0)
Monocytes Absolute: 0.7 10*3/uL (ref 0.2–0.9)
Monocytes Relative: 9 %
Neutro Abs: 4.7 10*3/uL (ref 1.4–6.5)
Neutrophils Relative %: 63 %
Platelets: 357 10*3/uL (ref 150–440)
RBC: 3.76 MIL/uL — ABNORMAL LOW (ref 3.80–5.20)
RDW: 14.1 % (ref 11.5–14.5)
WBC: 7.4 10*3/uL (ref 3.6–11.0)

## 2015-02-03 LAB — COMPREHENSIVE METABOLIC PANEL
ALK PHOS: 54 U/L (ref 38–126)
ALT: 16 U/L (ref 14–54)
ANION GAP: 7 (ref 5–15)
AST: 26 U/L (ref 15–41)
Albumin: 3.8 g/dL (ref 3.5–5.0)
BUN: 27 mg/dL — ABNORMAL HIGH (ref 6–20)
CALCIUM: 8.3 mg/dL — AB (ref 8.9–10.3)
CO2: 27 mmol/L (ref 22–32)
Chloride: 96 mmol/L — ABNORMAL LOW (ref 101–111)
Creatinine, Ser: 1.17 mg/dL — ABNORMAL HIGH (ref 0.44–1.00)
GFR calc non Af Amer: 40 mL/min — ABNORMAL LOW (ref >60)
GFR, EST AFRICAN AMERICAN: 47 mL/min — AB (ref >60)
Glucose, Bld: 155 mg/dL — ABNORMAL HIGH (ref 65–99)
Potassium: 3.5 mmol/L (ref 3.5–5.1)
SODIUM: 130 mmol/L — AB (ref 135–145)
Total Bilirubin: 0.5 mg/dL (ref 0.3–1.2)
Total Protein: 7.4 g/dL (ref 6.5–8.1)

## 2015-02-03 MED ORDER — FERROUS GLUCONATE 325 (36 FE) MG PO TABS: 1.0000 | ORAL_TABLET | Freq: Two times a day (BID) | ORAL | Status: DC

## 2015-02-03 NOTE — Progress Notes (Signed)
[place] @ [facility] Telephone:(336) [phone]  Fax:(336) [place]: 1925/05/06  MR#: [id number]  JKD#:[id number]  Patient Care Team: [name], MD as PCP - General (Family Medicine) Seeplaputhur Robinette Haines, MD (General Surgery) Forest Gleason, MD (Oncology)  CHIEF COMPLAINT:  No chief complaint on file.  Oncology History   1.Abnormal mammogram followed by biopsy of the right breast.  A 2:00 position patient had an abnormal 20m mass which has been biopsied.  Was positive for invasive carcinoma.  Estrogen and progesterone receptor and HER-2/neu receptor pending (March, 2016) 2.  Left breast carcinoma breast 22 years ago patient had lumpectomy radiation therapy and 2 years of tamoxifen. 3.  Patient underwent wide local excision on April 29 and sentinel lymph node biopsy.  Low-grade tumor estrogen and progesterone receptor positive HER-2 receptor negative T1 cN0 M0.        INTERVAL HISTORY: 81year old lady underwent wide local excision of the right breast.  1.4 cm tumor was found which was estrogen and progesterone receptor positive.  HER-2 receptor negative.  Margins were negative.  There was no lymphovascular invasion.  Patient is here to discuss local as well as systemic adjuvant therapy. She is tolerating letrozole.  No bony pains.  Here for further follow-up and treatment consideration,  February 03, 2015 Patient came today as an add on complaining of weakness. Started 2 or 3 weeks ago. Cramps in lower extremity Palpitation. Generalized weakness REVIEW OF SYSTEMS:    general status: Patient is feeling weak and tired.  No change in a performance status.  No chills.  No fever. HEENT:   No evidence of stomatitis Lungs: No cough or shortness of breath Cardiac: No chest pain or paroxysmal nocturnal dyspnea.  Palpitation GI: No nausea no vomiting no diarrhea no abdominal pain Skin: No rash Lower extremity no swelling Cramps in lower  extremity Neurological system: No tingling.  No numbness.  No other focal signs Musculoskeletal system no bony pains  As per HPI. Otherwise, a complete review of systems is negatve.  PAST MEDICAL HISTORY: Past Medical History  Diagnosis Date  . Arthritis   . Pneumonia July 2014  . COPD (chronic obstructive pulmonary disease)   . Heart valve problem     leaking  . Cancer 11-11-91    left breast   . Cancer 08-12-14    right breast, ER/PR positive, Her2 negative  . Breast cancer   . Carcinoma of left breast     Left breast carcinoma; patient had lumpectomy radiation therapy   . Carcinoma of right breast 10/12/2014    Abnormal mammogram followed by biopsy of the right breast.  A 2:00 position patient had an abnormal 861mmass which has been biopsied.  Was positive for invasive carcinoma.  Estrogen and progesterone receptor and HER-2/neu receptor pending (March, 2016)  . History of colonoscopy 2012  . History of mammogram 2016    PAST SURGICAL HISTORY: Past Surgical History  Procedure Laterality Date  . Colonoscopy    . Cholecystectomy  02/23/1972  . Breast surgery Left 11-11-91    lumpectomy with radiation, Tamoxifen for 2 years.Dr. CrSharlet Salina. Breast surgery Right 09/09/14    RIGHT BREAST WIDE EXCISION     FAMILY HISTORY Family History  Problem Relation Age of Onset  . Colon cancer Mother 6860. Colon cancer Brother 7575. Breast cancer Sister [id number]      ADVANCED DIRECTIVES: Patient does have advanced medical care directive  HEALTH MAINTENANCE: Social History  Substance Use Topics  . Smoking status: Never Smoker   . Smokeless tobacco: Never Used  . Alcohol Use: No      Allergies  Allergen Reactions  . Amoxicillin-Pot Clavulanate Nausea And Vomiting  . Other Nausea And Vomiting    Current Outpatient Prescriptions  Medication Sig Dispense Refill  . acetaminophen (TYLENOL) 650 MG CR tablet Take by mouth.    . Albuterol Sulfate (PROAIR HFA IN) Inhale into the  lungs as needed.    Marland Kitchen amLODipine (NORVASC) 5 MG tablet Take 5 mg by mouth daily.     Marland Kitchen aspirin 81 MG tablet Take 81 mg by mouth daily.    . calcium-vitamin D (OSCAL WITH D) 500-200 MG-UNIT per tablet Take 1 tablet by mouth 2 (two) times daily. 60 tablet 6  . fluticasone (FLONASE) 50 MCG/ACT nasal spray Place 1 spray into both nostrils daily. As needed for seasonal allergies  11  . letrozole (FEMARA) 2.5 MG tablet Take 1 tablet (2.5 mg total) by mouth daily. 30 tablet 6  . LORazepam (ATIVAN) 1 MG tablet Take 1 tablet (1 mg total) by mouth at bedtime as needed for anxiety. 30 tablet 3  . losartan-hydrochlorothiazide (HYZAAR) 50-12.5 MG per tablet Take 1 tablet by mouth daily.     . Magnesium 250 MG TABS Take 1 tablet by mouth daily. As needed for indigestion    . metoprolol succinate (TOPROL-XL) 50 MG 24 hr tablet Take 50 mg by mouth daily.     Marland Kitchen omeprazole (PRILOSEC) 40 MG capsule Take 40 mg by mouth daily.    . simvastatin (ZOCOR) 20 MG tablet Take 20 mg by mouth daily at 6 PM.      No current facility-administered medications for this visit.    OBJECTIVE:  There were no vitals filed for this visit.   There is no weight on file to calculate BMI.    ECOG FS:1 - Symptomatic but completely ambulatory  PHYSICAL EXAM: GENERAL:  Well developed, well nourished, sitting comfortably in the exam room in no acute distress. MENTAL STATUS:  Alert and oriented to person, place and time. HEAD:    Normocephalic, atraumatic, face symmetric, no Cushingoid features. EYES.  Pupils equal round and reactive to light and accomodation.  No conjunctivitis or scleral icterus. ENT:  Oropharynx clear without lesion.  Tongue normal. Mucous membranes moist.  RESPIRATORY:  Clear to auscultation without rales, wheezes or rhonchi. CARDIOVASCULAR:  Regular rate and rhythm without murmur, rub or gallop. BREAST:  Right breast without masses,.  Right breast wound is healing well.  There is seroma which is being followed by  Dr. Jamal Collin. no skin changes or nipple discharge.  Left breast without masses, skin changes or nipple discharge. ABDOMEN:  Soft, non-tender, with active bowel sounds, and no hepatosplenomegaly.  No masses. BACK:  No CVA tenderness.  No tenderness on percussion of the back or rib cage. SKIN:  No rashes, ulcers or lesions. EXTREMITIES: No edema, no skin discoloration or tenderness.  No palpable cords. LYMPH NODES: No palpable cervical, supraclavicular, axillary or inguinal adenopathy  NEUROLOGICAL: Unremarkable. PSYCH:  Appropriate.   LAB RESULTS:  Appointment on 02/03/2015  Component Date Value Ref Range Status  . WBC 02/03/2015 7.4  3.6 - 11.0 K/uL Final  . RBC 02/03/2015 3.76* 3.80 - 5.20 MIL/uL Final  . Hemoglobin 02/03/2015 10.1* 12.0 - 16.0 g/dL Final  . HCT 02/03/2015 30.5* 35.0 - 47.0 % Final  . MCV 02/03/2015 81.1  80.0 - 100.0  fL Final  . MCH 02/03/2015 27.0  26.0 - 34.0 pg Final  . MCHC 02/03/2015 33.3  32.0 - 36.0 g/dL Final  . RDW 02/03/2015 14.1  11.5 - 14.5 % Final  . Platelets 02/03/2015 357  150 - 440 K/uL Final  . Neutrophils Relative % 02/03/2015 63   Final  . Neutro Abs 02/03/2015 4.7  1.4 - 6.5 K/uL Final  . Lymphocytes Relative 02/03/2015 19   Final  . Lymphs Abs 02/03/2015 1.4  1.0 - 3.6 K/uL Final  . Monocytes Relative 02/03/2015 9   Final  . Monocytes Absolute 02/03/2015 0.7  0.2 - 0.9 K/uL Final  . Eosinophils Relative 02/03/2015 4   Final  . Eosinophils Absolute 02/03/2015 0.3  0 - 0.7 K/uL Final  . Basophils Relative 02/03/2015 5   Final  . Basophils Absolute 02/03/2015 0.3* 0 - 0.1 K/uL Final  . Sodium 02/03/2015 130* 135 - 145 mmol/L Final  . Potassium 02/03/2015 3.5  3.5 - 5.1 mmol/L Final  . Chloride 02/03/2015 96* 101 - 111 mmol/L Final  . CO2 02/03/2015 27  22 - 32 mmol/L Final  . Glucose, Bld 02/03/2015 155* 65 - 99 mg/dL Final  . BUN 02/03/2015 27* 6 - 20 mg/dL Final  . Creatinine, Ser 02/03/2015 1.17* 0.44 - 1.00 mg/dL Final  . Calcium  02/03/2015 8.3* 8.9 - 10.3 mg/dL Final  . Total Protein 02/03/2015 7.4  6.5 - 8.1 g/dL Final  . Albumin 02/03/2015 3.8  3.5 - 5.0 g/dL Final  . AST 02/03/2015 26  15 - 41 U/L Final  . ALT 02/03/2015 16  14 - 54 U/L Final  . Alkaline Phosphatase 02/03/2015 54  38 - 126 U/L Final  . Total Bilirubin 02/03/2015 0.5  0.3 - 1.2 mg/dL Final  . GFR calc non Af Amer 02/03/2015 40* >60 mL/min Final  . GFR calc Af Amer 02/03/2015 47* >60 mL/min Final   Comment: (NOTE) The eGFR has been calculated using the CKD EPI equation. This calculation has not been validated in all clinical situations. eGFR's persistently <60 mL/min signify possible Chronic Kidney Disease.   . Anion gap 02/03/2015 7  5 - 15 Final    Lab Results  Component Value Date   LABCA2 22.6 09/01/2014   No results found for: CA199 Lab Results  Component Value Date   CEA 1.6 09/01/2014      ASSESSMENT: 1.  Diagnosis of right breast cancer stage IC estrogen receptor positive progesterone receptor positive had lumpectomy and axillary lymph node evaluation with sentinel lymph node biopsy.  Estrogen and progesterone receptor positive.  (Diagnosis in April of 2016) 2.  Previous history of carcinoma of left breast 22 years ago status post lumpectomy radiation and tamoxifen for 2 years Anemia and microcytic without any clinical rectal bleeding   MEDICAL DECISION MAKING:  Stop letrozole, aspirin, and anticholesterol medication for next few days Start patient on iron tablets after assessing iron iron-binding capacity and ferritin level Stool for occult blood Reevaluate patient in 3 weeks Tumor markers pending  Patient expressed understanding and was in agreement with this plan. She also understands that She can call clinic at any time with any questions, concerns, or complaints.    Carcinoma of left breast   Staging form: Breast, AJCC 7th Edition     Clinical stage from 09/19/2014: Stage IA (yT1c, N0, M0) - Marni Griffon, MD   02/03/2015 10:59 AM

## 2015-02-04 LAB — CANCER ANTIGEN 27.29: CA 27.29: 11.1 U/mL (ref 0.0–38.6)

## 2015-02-10 DIAGNOSIS — C50211 Malignant neoplasm of upper-inner quadrant of right female breast: Secondary | ICD-10-CM | POA: Diagnosis not present

## 2015-02-11 DIAGNOSIS — C50211 Malignant neoplasm of upper-inner quadrant of right female breast: Secondary | ICD-10-CM | POA: Diagnosis not present

## 2015-02-12 ENCOUNTER — Other Ambulatory Visit: Payer: Medicare Other | Admitting: *Deleted

## 2015-02-12 DIAGNOSIS — C50211 Malignant neoplasm of upper-inner quadrant of right female breast: Secondary | ICD-10-CM | POA: Diagnosis not present

## 2015-02-12 DIAGNOSIS — C50911 Malignant neoplasm of unspecified site of right female breast: Secondary | ICD-10-CM

## 2015-02-12 LAB — OCCULT BLOOD X 1 CARD TO LAB, STOOL
FECAL OCCULT BLD: NEGATIVE
Fecal Occult Bld: POSITIVE — AB
Fecal Occult Bld: NEGATIVE

## 2015-02-24 ENCOUNTER — Encounter: Payer: Self-pay | Admitting: Oncology

## 2015-02-24 ENCOUNTER — Inpatient Hospital Stay: Payer: Medicare Other | Attending: Oncology | Admitting: Oncology

## 2015-02-24 ENCOUNTER — Inpatient Hospital Stay: Payer: Medicare Other

## 2015-02-24 VITALS — BP 151/66 | HR 59 | Temp 95.2°F | Wt 181.0 lb

## 2015-02-24 DIAGNOSIS — Z79899 Other long term (current) drug therapy: Secondary | ICD-10-CM | POA: Insufficient documentation

## 2015-02-24 DIAGNOSIS — R531 Weakness: Secondary | ICD-10-CM

## 2015-02-24 DIAGNOSIS — Z923 Personal history of irradiation: Secondary | ICD-10-CM

## 2015-02-24 DIAGNOSIS — Z79811 Long term (current) use of aromatase inhibitors: Secondary | ICD-10-CM | POA: Diagnosis not present

## 2015-02-24 DIAGNOSIS — D509 Iron deficiency anemia, unspecified: Secondary | ICD-10-CM | POA: Diagnosis not present

## 2015-02-24 DIAGNOSIS — Z170 Estrogen receptor positive status [ER+]: Secondary | ICD-10-CM | POA: Insufficient documentation

## 2015-02-24 DIAGNOSIS — Z9223 Personal history of estrogen therapy: Secondary | ICD-10-CM | POA: Diagnosis not present

## 2015-02-24 DIAGNOSIS — R5383 Other fatigue: Secondary | ICD-10-CM | POA: Diagnosis not present

## 2015-02-24 DIAGNOSIS — C50911 Malignant neoplasm of unspecified site of right female breast: Secondary | ICD-10-CM

## 2015-02-24 DIAGNOSIS — J449 Chronic obstructive pulmonary disease, unspecified: Secondary | ICD-10-CM

## 2015-02-24 DIAGNOSIS — Z853 Personal history of malignant neoplasm of breast: Secondary | ICD-10-CM | POA: Diagnosis not present

## 2015-02-24 DIAGNOSIS — C50211 Malignant neoplasm of upper-inner quadrant of right female breast: Secondary | ICD-10-CM | POA: Diagnosis not present

## 2015-02-24 LAB — CBC WITH DIFFERENTIAL/PLATELET
Basophils Absolute: 0 10*3/uL (ref 0–0.1)
Basophils Relative: 0 %
EOS ABS: 0.3 10*3/uL (ref 0–0.7)
Eosinophils Relative: 4 %
HEMATOCRIT: 35.6 % (ref 35.0–47.0)
HEMOGLOBIN: 11.8 g/dL — AB (ref 12.0–16.0)
LYMPHS ABS: 1.9 10*3/uL (ref 1.0–3.6)
Lymphocytes Relative: 23 %
MCH: 27.2 pg (ref 26.0–34.0)
MCHC: 33.1 g/dL (ref 32.0–36.0)
MCV: 82.1 fL (ref 80.0–100.0)
MONO ABS: 1 10*3/uL — AB (ref 0.2–0.9)
MONOS PCT: 12 %
NEUTROS PCT: 61 %
Neutro Abs: 5.2 10*3/uL (ref 1.4–6.5)
Platelets: 356 10*3/uL (ref 150–440)
RBC: 4.34 MIL/uL (ref 3.80–5.20)
RDW: 16.3 % — AB (ref 11.5–14.5)
WBC: 8.4 10*3/uL (ref 3.6–11.0)

## 2015-02-24 LAB — IRON AND TIBC
Iron: 303 ug/dL — ABNORMAL HIGH (ref 28–170)
Saturation Ratios: 62 % — ABNORMAL HIGH (ref 10.4–31.8)
TIBC: 492 ug/dL — ABNORMAL HIGH (ref 250–450)
UIBC: 189 ug/dL

## 2015-02-24 LAB — FERRITIN: FERRITIN: 13 ng/mL (ref 11–307)

## 2015-02-24 NOTE — Progress Notes (Signed)
[place] @ [facility] Telephone:(336) [phone]  Fax:(336) [place]: [birth year]  MR#: [id number]  XNA#:[id number]  Patient Care Team: [name], MD as PCP - General (Family Medicine) Seeplaputhur Robinette Haines, MD (General Surgery) Forest Gleason, MD (Oncology)  CHIEF COMPLAINT:  Chief Complaint  Patient presents with  . OTHER   Oncology History   1.Abnormal mammogram followed by biopsy of the right breast.  A 2:00 position patient had an abnormal 57m mass which has been biopsied.  Was positive for invasive carcinoma.  Estrogen and progesterone receptor and HER-2/neu receptor pending (March, 2016) 2.  Left breast carcinoma breast 22 years ago patient had lumpectomy radiation therapy and 2 years of tamoxifen. 3.  Patient underwent wide local excision on April 29 and sentinel lymph node biopsy.  Low-grade tumor estrogen and progesterone receptor positive HER-2 receptor negative T1 cN0 M0.        INTERVAL HISTORY: 81year old lady underwent wide local excision of the right breast.  1.4 cm tumor was found which was estrogen and progesterone receptor positive.  HER-2 receptor negative.  Margins were negative.  There was no lymphovascular invasion.  Patient is here to discuss local as well as systemic adjuvant therapy. She is tolerating letrozole.  No bony pains.  Here for further follow-up and treatment consideration, Patient is here for further follow-up regarding anemia. After starting intravenous oral n advised to stop aspirin.  Stool for occult blood 1 case was positive. REVIEW OF SYSTEMS:    general status: Patient is feeling weak and tired.  No change in a performance status.  No chills.  No fever. HEENT:   No evidence of stomatitis Lungs: No cough or shortness of breath Cardiac: No chest pain or paroxysmal nocturnal dyspnea.  Palpitation GI: No nausea no vomiting no diarrhea no abdominal pain Skin: No rash Lower extremity no swelling Cramps in lower  extremity Neurological system: No tingling.  No numbness.  No other focal signs Musculoskeletal system no bony pains  As per HPI. Otherwise, a complete review of systems is negatve.  PAST MEDICAL HISTORY: Past Medical History  Diagnosis Date  . Arthritis   . Pneumonia July 2014  . COPD (chronic obstructive pulmonary disease) ([facility]   . Heart valve problem     leaking  . [place] [place]    left breast   . [place] [place]    right breast, ER/PR positive, Her2 negative  . [place]   . [place] of left breast ([facility]     Left breast carcinoma; patient had lumpectomy radiation therapy   . [place] 10/12[place]    Abnormal mammogram followed by biopsy of the right breast.  A 2:00 position patient had an abnormal 857mmass which has been biopsied.  Was positive for invasive carcinoma.  Estrogen and progesterone receptor and HER-2/neu receptor pending (March, 2016)  . History of colonoscopy 2012  . History of mammogram 2016    PAST SURGICAL HISTORY: Past Surgical History  Procedure Laterality Date  . Colonoscopy    . Cholecystectomy  02/23/1972  . Breast surgery Left 11-11-91    lumpectomy with radiation, Tamoxifen for 2 years.Dr. CrSharlet Salina. Breast surgery Right 09/09/14    RIGHT BREAST WIDE EXCISION     FAMILY HISTORY Family History  Problem Relation Age of Onset  . Colon cancer Mother 6848. Colon cancer Brother 7576. Breast cancer Sister 3235  ADVANCED DIRECTIVES: Patient does have advanced medical care directive   HEALTH MAINTENANCE: Social History  Substance Use Topics  . Smoking status: Never Smoker   . Smokeless tobacco: Never Used  . Alcohol Use: No      Allergies  Allergen Reactions  . Amoxicillin-Pot Clavulanate Nausea And Vomiting  . Other Nausea And Vomiting    Current Outpatient Prescriptions  Medication Sig Dispense Refill  . acetaminophen (TYLENOL) 650 MG CR tablet Take by mouth.    Marland Kitchen amLODipine  (NORVASC) 5 MG tablet Take 5 mg by mouth daily.     . Ferrous Gluconate 325 (36 FE) MG TABS Take 1 tablet by mouth 2 (two) times daily. 60 tablet 3  . fluticasone (FLONASE) 50 MCG/ACT nasal spray Place 1 spray into both nostrils daily. As needed for seasonal allergies  11  . LORazepam (ATIVAN) 1 MG tablet Take 1 tablet (1 mg total) by mouth at bedtime as needed for anxiety. 30 tablet 3  . losartan-hydrochlorothiazide (HYZAAR) 50-12.5 MG per tablet Take 1 tablet by mouth daily.     . metoprolol succinate (TOPROL-XL) 50 MG 24 hr tablet Take 50 mg by mouth daily.     . Albuterol Sulfate (PROAIR HFA IN) Inhale into the lungs as needed.    Marland Kitchen aspirin 81 MG tablet Take 81 mg by mouth daily.    . calcium-vitamin D (OSCAL WITH D) 500-200 MG-UNIT per tablet Take 1 tablet by mouth 2 (two) times daily. (Patient not taking: Reported on 02/24/2015) 60 tablet 6  . letrozole (FEMARA) 2.5 MG tablet Take 1 tablet (2.5 mg total) by mouth daily. (Patient not taking: Reported on 02/24/2015) 30 tablet 6  . Magnesium 250 MG TABS Take 1 tablet by mouth daily. As needed for indigestion    . omeprazole (PRILOSEC) 40 MG capsule Take 40 mg by mouth daily.    . simvastatin (ZOCOR) 20 MG tablet Take 20 mg by mouth daily at 6 PM.      No current facility-administered medications for this visit.    OBJECTIVE:  Filed Vitals:   02/24/15 1219  BP: 151/66  Pulse: 59  Temp: 95.2 F (35.1 C)     Body mass index is 29.23 kg/(m^2).    ECOG FS:1 - Symptomatic but completely ambulatory  PHYSICAL EXAM: GENERAL:  Well developed, well nourished, sitting comfortably in the exam room in no acute distress. MENTAL STATUS:  Alert and oriented to person, place and time. HEAD:    Normocephalic, atraumatic, face symmetric, no Cushingoid features. EYES.  Pupils equal round and reactive to light and accomodation.  No conjunctivitis or scleral icterus. ENT:  Oropharynx clear without lesion.  Tongue normal. Mucous membranes moist.    RESPIRATORY:  Clear to auscultation without rales, wheezes or rhonchi. CARDIOVASCULAR:  Regular rate and rhythm without murmur, rub or gallop. BREAST:  Right breast without masses,.  Right breast wound is healing well.  There is seroma which is being followed by Dr. Jamal Collin. no skin changes or nipple discharge.  Left breast without masses, skin changes or nipple discharge. ABDOMEN:  Soft, non-tender, with active bowel sounds, and no hepatosplenomegaly.  No masses. BACK:  No CVA tenderness.  No tenderness on percussion of the back or rib cage. SKIN:  No rashes, ulcers or lesions. EXTREMITIES: No edema, no skin discoloration or tenderness.  No palpable cords. LYMPH NODES: No palpable cervical, supraclavicular, axillary or inguinal adenopathy  NEUROLOGICAL: Unremarkable. PSYCH:  Appropriate.   LAB RESULTS:  No visits with results within 3  Day(s) from this visit. Latest known visit with results is:  Orders Only on 02/12/2015  Component Date Value Ref Range Status  . Fecal Occult Bld 02/12/2015 NEGATIVE  NEGATIVE Final  . Fecal Occult Bld 02/10/2015 POSITIVE* NEGATIVE Final  . Fecal Occult Bld 02/11/2015 NEGATIVE  NEGATIVE Final    Lab Results  Component Value Date   LABCA2 11.1 02/03/2015   No results found for: CA199 Lab Results  Component Value Date   CEA 1.6 09/01/2014      ASSESSMENT: 1.  Diagnosis of right breast cancer stage IC estrogen receptor positive progesterone receptor positive had lumpectomy and axillary lymph node evaluation with sentinel lymph node biopsy.  Estrogen and progesterone receptor positive.  (Diagnosis in April of 2016) 2.  Previous history of carcinoma of left breast 22 years ago status post lumpectomy radiation and tamoxifen for 2 years Anemia and microcytic without any clinical rectal bleeding Ferritin is low  MEDICAL DECISION MAKING:  All the tumor markers were within normal limits.  Hemoglobin improved Patient general condition has  improved. Patient was asked to get evaluation by GI physician.  Considering her age colonoscopy on a routine ground may not be recommended but because of anemia and 1 positive stool up for a lower endoscopy can be considered.  Patient will be reevaluated with iron studies  Patient expressed understanding and was in agreement with this plan. She also understands that She can call clinic at any time with any questions, concerns, or complaints.    Carcinoma of left breast   Staging form: Breast, AJCC 7th Edition     Clinical stage from 09/19/2014: Stage IA (yT1c, N0, M0) - Marni Griffon, MD   02/24/2015 12:54 PM

## 2015-02-24 NOTE — Progress Notes (Signed)
Patient does not have living will.  Former smoker. 

## 2015-03-02 ENCOUNTER — Telehealth: Payer: Self-pay | Admitting: *Deleted

## 2015-03-02 ENCOUNTER — Other Ambulatory Visit: Payer: Medicare Other

## 2015-03-02 ENCOUNTER — Ambulatory Visit: Payer: Medicare Other | Admitting: Oncology

## 2015-03-02 ENCOUNTER — Encounter: Payer: Self-pay | Admitting: Oncology

## 2015-03-02 NOTE — Telephone Encounter (Signed)
Patient stopped by clinic today to ask about labs to see if her hgb had improved.  Also wanted to know if she can go back on her iron pills.  Reviewed labs with patient and told her it is okay to resume iron tablets.  She has appointment with GI 03-09-15.

## 2015-03-09 NOTE — Progress Notes (Signed)
Card sent for follow up.

## 2015-04-09 ENCOUNTER — Other Ambulatory Visit: Payer: Self-pay | Admitting: *Deleted

## 2015-04-09 DIAGNOSIS — C50211 Malignant neoplasm of upper-inner quadrant of right female breast: Secondary | ICD-10-CM

## 2015-05-27 ENCOUNTER — Inpatient Hospital Stay: Payer: Medicare Other | Admitting: Oncology

## 2015-05-27 ENCOUNTER — Inpatient Hospital Stay: Payer: Medicare Other

## 2015-06-11 ENCOUNTER — Inpatient Hospital Stay: Payer: Medicare Other | Attending: Oncology

## 2015-06-11 ENCOUNTER — Inpatient Hospital Stay (HOSPITAL_BASED_OUTPATIENT_CLINIC_OR_DEPARTMENT_OTHER): Payer: Medicare Other | Admitting: Oncology

## 2015-06-11 VITALS — BP 171/82 | HR 58 | Temp 95.8°F | Resp 18 | Wt 177.5 lb

## 2015-06-11 DIAGNOSIS — Z79899 Other long term (current) drug therapy: Secondary | ICD-10-CM | POA: Diagnosis not present

## 2015-06-11 DIAGNOSIS — M1990 Unspecified osteoarthritis, unspecified site: Secondary | ICD-10-CM | POA: Insufficient documentation

## 2015-06-11 DIAGNOSIS — J449 Chronic obstructive pulmonary disease, unspecified: Secondary | ICD-10-CM

## 2015-06-11 DIAGNOSIS — C50211 Malignant neoplasm of upper-inner quadrant of right female breast: Secondary | ICD-10-CM | POA: Diagnosis present

## 2015-06-11 DIAGNOSIS — Z853 Personal history of malignant neoplasm of breast: Secondary | ICD-10-CM

## 2015-06-11 DIAGNOSIS — R011 Cardiac murmur, unspecified: Secondary | ICD-10-CM | POA: Insufficient documentation

## 2015-06-11 DIAGNOSIS — C170 Malignant neoplasm of duodenum: Secondary | ICD-10-CM | POA: Insufficient documentation

## 2015-06-11 DIAGNOSIS — Z9223 Personal history of estrogen therapy: Secondary | ICD-10-CM

## 2015-06-11 DIAGNOSIS — Z79811 Long term (current) use of aromatase inhibitors: Secondary | ICD-10-CM | POA: Diagnosis not present

## 2015-06-11 DIAGNOSIS — Z923 Personal history of irradiation: Secondary | ICD-10-CM | POA: Diagnosis not present

## 2015-06-11 DIAGNOSIS — D509 Iron deficiency anemia, unspecified: Secondary | ICD-10-CM | POA: Insufficient documentation

## 2015-06-11 DIAGNOSIS — C50911 Malignant neoplasm of unspecified site of right female breast: Secondary | ICD-10-CM

## 2015-06-11 DIAGNOSIS — R1011 Right upper quadrant pain: Secondary | ICD-10-CM

## 2015-06-11 LAB — CBC WITH DIFFERENTIAL/PLATELET
Basophils Absolute: 0.2 10*3/uL — ABNORMAL HIGH (ref 0–0.1)
Basophils Relative: 3 %
EOS ABS: 0.4 10*3/uL (ref 0–0.7)
Eosinophils Relative: 5 %
HEMATOCRIT: 41.1 % (ref 35.0–47.0)
HEMOGLOBIN: 14 g/dL (ref 12.0–16.0)
LYMPHS ABS: 1.7 10*3/uL (ref 1.0–3.6)
LYMPHS PCT: 23 %
MCH: 29.6 pg (ref 26.0–34.0)
MCHC: 34 g/dL (ref 32.0–36.0)
MCV: 86.9 fL (ref 80.0–100.0)
MONOS PCT: 12 %
Monocytes Absolute: 0.9 10*3/uL (ref 0.2–0.9)
NEUTROS ABS: 4.2 10*3/uL (ref 1.4–6.5)
NEUTROS PCT: 57 %
Platelets: 285 10*3/uL (ref 150–440)
RBC: 4.73 MIL/uL (ref 3.80–5.20)
RDW: 15.6 % — ABNORMAL HIGH (ref 11.5–14.5)
WBC: 7.4 10*3/uL (ref 3.6–11.0)

## 2015-06-11 LAB — COMPREHENSIVE METABOLIC PANEL
ALK PHOS: 61 U/L (ref 38–126)
ALT: 20 U/L (ref 14–54)
AST: 22 U/L (ref 15–41)
Albumin: 3.8 g/dL (ref 3.5–5.0)
Anion gap: 6 (ref 5–15)
BILIRUBIN TOTAL: 0.6 mg/dL (ref 0.3–1.2)
BUN: 17 mg/dL (ref 6–20)
CALCIUM: 9.2 mg/dL (ref 8.9–10.3)
CO2: 29 mmol/L (ref 22–32)
CREATININE: 0.95 mg/dL (ref 0.44–1.00)
Chloride: 99 mmol/L — ABNORMAL LOW (ref 101–111)
GFR calc non Af Amer: 52 mL/min — ABNORMAL LOW (ref >60)
GLUCOSE: 113 mg/dL — AB (ref 65–99)
Potassium: 4.2 mmol/L (ref 3.5–5.1)
SODIUM: 134 mmol/L — AB (ref 135–145)
TOTAL PROTEIN: 7.6 g/dL (ref 6.5–8.1)

## 2015-06-11 NOTE — Progress Notes (Signed)
Patient states her hair is thinning and her nails are brittle.

## 2015-06-11 NOTE — Progress Notes (Signed)
Kristy Hardy  [phone]  [phone]     Kristy Hardy DOB: 10/27/1924  MR#: [id number]  MCN#:[id number]  Patient Care Team: Kristy Pink, MD as PCP - General (Family Medicine) Seeplaputhur Kristy Haines, MD (General Surgery) Kristy Gleason, MD (Oncology)  CHIEF COMPLAINT:  Chief Complaint  Patient presents with  . Breast Cancer    INTERVAL HISTORY:  Patient is here for further follow-up and treatment consideration regarding carcinoma of the right breast. Patient is currently on letrozole and tolerating very well. She also reports taking calcium and vitamin D as directed. She states she performs monthly breast self exams and has not found any abnormality or concern. She does have a mammogram scheduled for March 2017. Patient also with significant past medical history of left breast cancer well over 20 years ago. She is also being monitored by Dr. Oliva Hardy for iron deficiency anemia. Patient overall reports feeling very well and denies any acute complaints.  REVIEW OF SYSTEMS:   Review of Systems  Constitutional: Negative for fever, chills, weight loss, malaise/fatigue and diaphoresis.  HENT: Negative.   Eyes: Negative.   Respiratory: Negative for cough, hemoptysis, sputum production, shortness of breath and wheezing.   Cardiovascular: Negative for chest pain, palpitations, orthopnea, claudication, leg swelling and PND.  Gastrointestinal: Negative for heartburn, nausea, vomiting, abdominal pain, diarrhea, constipation, blood in stool and melena.  Genitourinary: Negative.   Musculoskeletal: Negative.   Skin: Negative.   Neurological: Negative for dizziness, tingling, focal weakness, seizures and weakness.  Endo/Heme/Allergies: Does not bruise/bleed easily.  Psychiatric/Behavioral: Negative for depression. The patient is not nervous/anxious and does not have insomnia.     As per HPI. Otherwise, a complete review of systems is negatve.  ONCOLOGY  HISTORY: Oncology History   1.Abnormal mammogram followed by biopsy of the right breast.  A 2:00 position patient had an abnormal 26m mass which has been biopsied.  Was positive for invasive carcinoma.  Estrogen and progesterone receptor and HER-2/neu receptor pending (March, 2016) 2.  Left breast carcinoma breast 22 years ago patient had lumpectomy radiation therapy and 2 years of tamoxifen. 3.  Patient underwent wide local excision on April 29 and sentinel lymph node biopsy.  Low-grade tumor estrogen and progesterone receptor positive HER-2 receptor negative T1 cN0 M0.     [place] breast ([place]   10/12/2014 Initial Diagnosis Carcinoma of left breast    PAST MEDICAL HISTORY: Past Medical History  Diagnosis Date  . Arthritis   . Pneumonia July 2014  . COPD (ch[place] obstructive [place] disease) ([place]   . Heart valve problem     leaking  . [place]    left breast   . Cancer ([place] 08-12-14    right breast, ER/PR positive, Her2 negative  . Breast cancer ([facility]   . Car[place] of left breast ([place]     Left breast carcinoma; patient had lumpectomy radiation therapy   . Car[place]oma of right breast ([facility] 10/12/2014    Abnormal mammogram followed by biopsy of the right breast.  A 2:00 position patient had an abnormal 846mmass which has been biopsied.  Was positive for invasive carcinoma.  Estrogen and progesterone receptor and HER-2/neu receptor pending (March, 2016)  . History of colonoscopy 2012  . History of mammogram 2016    PAST SURGICAL HISTORY: Past Surgical History  Procedure Laterality Date  . Colonoscopy    . Cholecystectomy  02/23/1972  . Breast surgery Left 11-11-91    lumpectomy with radiation, Tamoxifen for  2 years.Dr. Sharlet Salina  . Breast surgery Right 09/09/14    RIGHT BREAST WIDE EXCISION     FAMILY HISTORY Family History  Problem Relation Age of Onset  . Colon cancer Mother 40  . Colon cancer Brother 60  . Breast cancer Sister 77    GYNECOLOGIC  HISTORY:  No LMP recorded. Patient is postmenopausal.     ADVANCED DIRECTIVES:    HEALTH MAINTENANCE: Social History  Substance Use Topics  . Smoking status: Never Smoker   . Smokeless tobacco: Never Used  . Alcohol Use: No     Colonoscopy:  PAP:  Bone density:  Lipid panel:  Allergies  Allergen Reactions  . Amoxicillin-Pot Clavulanate Nausea And Vomiting  . Other Nausea And Vomiting    Current Outpatient Prescriptions  Medication Sig Dispense Refill  . acetaminophen (TYLENOL) 650 MG CR tablet Take by mouth.    . Albuterol Sulfate (PROAIR HFA IN) Inhale into the lungs as needed.    Marland Kitchen amLODipine (NORVASC) 5 MG tablet Take 5 mg by mouth daily.     . calcium-vitamin D (OSCAL WITH D) 500-200 MG-UNIT per tablet Take 1 tablet by mouth 2 (two) times daily. 60 tablet 6  . Ferrous Gluconate 325 (36 FE) MG TABS Take 1 tablet by mouth 2 (two) times daily. 60 tablet 3  . fluticasone (FLONASE) 50 MCG/ACT nasal spray Place 1 spray into both nostrils daily. As needed for seasonal allergies  11  . letrozole (FEMARA) 2.5 MG tablet Take 1 tablet (2.5 mg total) by mouth daily. 30 tablet 6  . LORazepam (ATIVAN) 1 MG tablet Take 1 tablet (1 mg total) by mouth at bedtime as needed for anxiety. 30 tablet 3  . losartan-hydrochlorothiazide (HYZAAR) 50-12.5 MG per tablet Take 1 tablet by mouth daily.     . Magnesium 250 MG TABS Take 1 tablet by mouth daily. As needed for indigestion    . metoprolol succinate (TOPROL-XL) 50 MG 24 hr tablet Take 50 mg by mouth daily.     Marland Kitchen omeprazole (PRILOSEC) 40 MG capsule Take 40 mg by mouth daily.    . simvastatin (ZOCOR) 20 MG tablet Take 20 mg by mouth daily at 6 PM.      No current facility-administered medications for this visit.    OBJECTIVE: BP 171/82 mmHg  Pulse 58  Temp(Src) 95.8 F (35.4 C) (Tympanic)  Resp 18  Wt 177 lb 7.5 oz (80.5 kg)   Body mass index is 28.66 kg/(m^2).    ECOG FS:0 - Asymptomatic  General: Well-developed, well-nourished, no  acute distress. Eyes: Hardy conjunctiva, anicteric sclera. HEENT: Normocephalic, moist mucous membranes, clear oropharnyx. Lungs: Clear to auscultation bilaterally. Heart: Systolic murmur grade 3/6. Regular rate and rhythm. Abdomen: Soft, nontender, nondistended. No organomegaly noted, normoactive bowel sounds. Breast: Breast palpated in a circular manner in the sitting and supine positions.  No masses or fullness palpated.  Axilla palpated in both positions with no masses or fullness palpated. Right breast with surgical and radiation scarring. Musculoskeletal: No edema, cyanosis, or clubbing. Neuro: Alert, answering all questions appropriately. Cranial nerves grossly intact. Skin: No rashes or petechiae noted. Psych: Normal affect. Lymphatics: No cervical, clavicular, or axillary LAD.   LAB RESULTS:  Appointment on 06/11/2015  Component Date Value Ref Range Status  . WBC 06/11/2015 7.4  3.6 - 11.0 K/uL Final  . RBC 06/11/2015 4.73  3.80 - 5.20 MIL/uL Final  . Hemoglobin 06/11/2015 14.0  12.0 - 16.0 g/dL Final  . HCT 06/11/2015 41.1  35.0 -  47.0 % Final  . MCV 06/11/2015 86.9  80.0 - 100.0 fL Final  . MCH 06/11/2015 29.6  26.0 - 34.0 pg Final  . MCHC 06/11/2015 34.0  32.0 - 36.0 g/dL Final  . RDW 06/11/2015 15.6* 11.5 - 14.5 % Final  . Platelets 06/11/2015 285  150 - 440 K/uL Final  . Neutrophils Relative % 06/11/2015 57   Final  . Neutro Abs 06/11/2015 4.2  1.4 - 6.5 K/uL Final  . Lymphocytes Relative 06/11/2015 23   Final  . Lymphs Abs 06/11/2015 1.7  1.0 - 3.6 K/uL Final  . Monocytes Relative 06/11/2015 12   Final  . Monocytes Absolute 06/11/2015 0.9  0.2 - 0.9 K/uL Final  . Eosinophils Relative 06/11/2015 5   Final  . Eosinophils Absolute 06/11/2015 0.4  0 - 0.7 K/uL Final  . Basophils Relative 06/11/2015 3   Final  . Basophils Absolute 06/11/2015 0.2* 0 - 0.1 K/uL Final  . Sodium 06/11/2015 134* 135 - 145 mmol/L Final  . Potassium 06/11/2015 4.2  3.5 - 5.1 mmol/L Final  .  Chloride 06/11/2015 99* 101 - 111 mmol/L Final  . CO2 06/11/2015 29  22 - 32 mmol/L Final  . Glucose, Bld 06/11/2015 113* 65 - 99 mg/dL Final  . BUN 06/11/2015 17  6 - 20 mg/dL Final  . Creatinine, Ser 06/11/2015 0.95  0.44 - 1.00 mg/dL Final  . Calcium 06/11/2015 9.2  8.9 - 10.3 mg/dL Final  . Total Protein 06/11/2015 7.6  6.5 - 8.1 g/dL Final  . Albumin 06/11/2015 3.8  3.5 - 5.0 g/dL Final  . AST 06/11/2015 22  15 - 41 U/L Final  . ALT 06/11/2015 20  14 - 54 U/L Final  . Alkaline Phosphatase 06/11/2015 61  38 - 126 U/L Final  . Total Bilirubin 06/11/2015 0.6  0.3 - 1.2 mg/dL Final  . GFR calc non Af Amer 06/11/2015 52* >60 mL/min Final  . GFR calc Af Amer 06/11/2015 >60  >60 mL/min Final   Comment: (NOTE) The eGFR has been calculated using the CKD EPI equation. This calculation has not been validated in all clinical situations. eGFR's persistently <60 mL/min signify possible Chronic Kidney Disease.   . Anion gap 06/11/2015 6  5 - 15 Final    STUDIES: No results found.  ASSESSMENT:  Carcinoma of right breast, T1 cN0 M0, stage IA. Anemia. History of cancer of the left breast.  PLAN:   1. Carcinoma right breast. Patient is status post wide local excision of 1.4 cm tumor, ER/PR positive, HER-2 receptor negative. There is no lymphovascular invasion and margins were clear at time of resection. Patient did complete XRT and is now on aromatase inhibitor. She is tolerating letrozole, calcium, and vitamin D very well. Patient was seen by Dr. Baruch Gouty today as well. She does have mammograms scheduled for March 2017. 2. Anemia. Previously hemoglobin dropped to as low as 10.1, she did have one of 4 stool studies positive for blood. However, given her age it was thought that she was not a good candidate for colonoscopy at this time and patient agreed. Patient was started on oral iron therapy in 2016. Hemoglobin today is 14.0. She is overall feeling very well. Encouraged patient to continue with  current oral iron.  Patient will continue with routine follow-up in approximately 6 months.  Patient expressed understanding and was in agreement with this plan. She also understands that She can call clinic at any time with any questions, concerns, or complaints.  Dr. Oliva Hardy was available for consultation and review of plan of care for this patient.  Carcinoma of right breast [facility])   Staging form: Breast, AJCC 7th Edition     Clinical stage from 09/19/2014: Stage IA (yT1c, N0, M0) - Unsigned   Evlyn Kanner, NP   06/11/2015 2:10 PM

## 2015-06-18 ENCOUNTER — Ambulatory Visit
Admission: RE | Admit: 2015-06-18 | Discharge: 2015-06-18 | Disposition: A | Discharge status: Home / Self Care | Payer: Medicare Other | Source: Ambulatory Visit | Attending: Radiation Oncology | Admitting: Radiation Oncology

## 2015-06-18 ENCOUNTER — Encounter: Payer: Self-pay | Admitting: Radiation Oncology

## 2015-06-18 VITALS — BP 153/78 | HR 62 | Temp 98.0°F | Wt 176.4 lb

## 2015-06-18 DIAGNOSIS — C50211 Malignant neoplasm of upper-inner quadrant of right female breast: Secondary | ICD-10-CM

## 2015-06-18 NOTE — Progress Notes (Signed)
Radiation Oncology Follow up Note  Name: Kristy Hardy   Date:   06/18/2015 MRN:  [id number] DOB: [birth year]    This 82 y.o. female presents to the clinic today for follow-up for breast Cancer stage I ER/PR positive HER-2/neu negative status post wide local excision and whole breast radiation to her right breast.  REFERRING PROVIDER: [name], MD  HPI: Patient is a 82 year old female now out 6 months having completed whole breast radiation to her right breast for stage I (T1 CN 0 M0) ER/PR positive HER-2/neu negative invasive mammary carcinoma status post wide local excision and sentinel node biopsy. Seen today in routine follow-up she is doing well. She specifically denies breast tenderness cough or bone pain. She's currently on letrozole tolerating that well without side effect.. She is scheduled for follow-up mammograms in March.  COMPLICATIONS OF TREATMENT: none  FOLLOW UP COMPLIANCE: keeps appointments   PHYSICAL EXAM:  BP 153/78 mmHg  Pulse 62  Temp(Src) 98 F (36.7 C)  Wt 176 lb 5.9 oz (80 kg) Lungs are clear to A&P cardiac examination essentially unremarkable with regular rate and rhythm. No dominant mass or nodularity is noted in either breast in 2 positions examined. Incision is well-healed. No axillary or supraclavicular adenopathy is appreciated. Cosmetic result is excellent. Well-developed well-nourished patient in NAD. HEENT reveals PERLA, EOMI, discs not visualized.  Oral cavity is clear. No oral mucosal lesions are identified. Neck is clear without evidence of cervical or supraclavicular adenopathy. Lungs are clear to A&P. Cardiac examination is essentially unremarkable with regular rate and rhythm without murmur rub or thrill. Abdomen is benign with no organomegaly or masses noted. Motor sensory and DTR levels are equal and symmetric in the upper and lower extremities. Cranial nerves II through XII are grossly intact. Proprioception is intact. No peripheral  adenopathy or edema is identified. No motor or sensory levels are noted. Crude visual fields are within normal range.  RADIOLOGY RESULTS: Follow-up mammograms have been scheduled for March  PLAN: Present time she continues to do well with no evidence of disease now out 6 months from whole breast radiation. She continues on letrozole without side effect. She has scheduled mammograms in March. I have asked to see her back in 6 months for follow-up. She continues close follow-up care with medical oncology. Patient knows to call sooner with any concerns.  I would like to take this opportunity for allowing me to participate in the care of your patient.Armstead Peaks., MD

## 2015-08-05 ENCOUNTER — Other Ambulatory Visit: Payer: Self-pay | Admitting: General Surgery

## 2015-08-05 ENCOUNTER — Ambulatory Visit: Payer: Medicare Other | Admitting: General Surgery

## 2015-08-05 ENCOUNTER — Ambulatory Visit
Admission: RE | Admit: 2015-08-05 | Discharge: 2015-08-05 | Disposition: A | Discharge status: Home / Self Care | Payer: Medicare Other | Source: Ambulatory Visit | Attending: General Surgery | Admitting: General Surgery

## 2015-08-05 DIAGNOSIS — C50211 Malignant neoplasm of upper-inner quadrant of right female breast: Secondary | ICD-10-CM

## 2015-08-05 DIAGNOSIS — Z853 Personal history of malignant neoplasm of breast: Secondary | ICD-10-CM | POA: Insufficient documentation

## 2015-08-06 ENCOUNTER — Other Ambulatory Visit: Payer: Self-pay | Admitting: Oncology

## 2015-08-10 ENCOUNTER — Encounter: Payer: Self-pay | Admitting: General Surgery

## 2015-08-10 ENCOUNTER — Ambulatory Visit (INDEPENDENT_AMBULATORY_CARE_PROVIDER_SITE_OTHER): Payer: Medicare Other | Admitting: General Surgery

## 2015-08-10 VITALS — BP 138/76 | HR 68 | Resp 14 | Ht 62.0 in | Wt 170.0 lb

## 2015-08-10 DIAGNOSIS — Z853 Personal history of malignant neoplasm of breast: Secondary | ICD-10-CM | POA: Diagnosis not present

## 2015-08-10 DIAGNOSIS — C50211 Malignant neoplasm of upper-inner quadrant of right female breast: Secondary | ICD-10-CM | POA: Diagnosis not present

## 2015-08-10 NOTE — Patient Instructions (Signed)
The patient has been asked to return to the office in one year with a bilateral diagnostic mammogram. 

## 2015-08-10 NOTE — Progress Notes (Signed)
Patient ID: Kristy Hardy, female   DOB: [birth year], 82 y.o.   MRN: [id number]  Chief Complaint  Patient presents with  . Follow-up    mammogram    HPI Kristy Hardy is a 82 y.o. female who presents for a breast cancer follow up. 1 year since right breast lumpectomy. The most recent mammogram was done on 08/05/15.  Patient does perform regular self breast checks and gets regular mammograms done.   I have reviewed the history of present illness with the patient. HPI  Past Medical History  Diagnosis Date  . Arthritis   . Pneumonia July 2014  . COPD (chronic obstructive pulmonary disease) ([place])   . Heart valve problem     leaking  . History of colonoscopy 2012  . History of mammogram 2016  . Cancer ([place]) 11-11-91    left breast   . Cancer ([place]) 08-12-14    right breast, ER/PR positive, Her2 negative  . Breast cancer ([place])   . Carcinoma of left breast ([place])     Left breast carcinoma; patient had lumpectomy radiation therapy   . Carcinoma of right breast ([place]) 10/12/2014    Abnormal mammogram followed by biopsy of the right breast.  A 2:00 position patient had an abnormal 7m mass which has been biopsied.  Was positive for invasive carcinoma.  Estrogen and progesterone receptor and HER-2/neu receptor pending (March, 2016)    Past Surgical History  Procedure Laterality Date  . Colonoscopy    . Cholecystectomy  02/23/1972  . Breast surgery Left 11-11-91    lumpectomy with radiation, Tamoxifen for 2 years.Dr. CSharlet Salina . Breast surgery Right 09/09/14    RIGHT BREAST WIDE EXCISION   . Breast biopsy Bilateral [id number]   Positive    Family History  Problem Relation Age of Onset  . Colon cancer Mother 694 . Colon cancer Brother 752 . Breast cancer Sister 38   Social History Social History  Substance Use Topics  . Smoking status: Never Smoker   . Smokeless tobacco: Never Used  . Alcohol Use: No    Allergies  Allergen Reactions  . Amoxicillin-Pot  Clavulanate Nausea And Vomiting  . Other Nausea And Vomiting    Current Outpatient Prescriptions  Medication Sig Dispense Refill  . acetaminophen (TYLENOL) 650 MG CR tablet Take by mouth as needed.     . Albuterol Sulfate (PROAIR HFA IN) Inhale into the lungs as needed.    .Marland KitchenamLODipine (NORVASC) 5 MG tablet Take 5 mg by mouth daily.     .Marland Kitchenaspirin 81 MG tablet Take 81 mg by mouth daily.    . calcium-vitamin D (OSCAL WITH D) 500-200 MG-UNIT per tablet Take 1 tablet by mouth 2 (two) times daily. 60 tablet 6  . Ferrous Gluconate 325 (36 FE) MG TABS Take 1 tablet by mouth 2 (two) times daily. 60 tablet 3  . fluticasone (FLONASE) 50 MCG/ACT nasal spray Place 1 spray into both nostrils daily. As needed for seasonal allergies  11  . letrozole (FEMARA) 2.5 MG tablet TAKE 1 TABLET(2.5 MG) BY MOUTH DAILY 30 tablet 0  . LORazepam (ATIVAN) 1 MG tablet Take 1 tablet (1 mg total) by mouth at bedtime as needed for anxiety. 30 tablet 3  . losartan-hydrochlorothiazide (HYZAAR) 50-12.5 MG per tablet Take 1 tablet by mouth daily.     . Magnesium 250 MG TABS Take 1 tablet by mouth daily. As needed for indigestion    . metoprolol succinate (TOPROL-XL) 50  MG 24 hr tablet Take 50 mg by mouth daily.     Marland Kitchen omeprazole (PRILOSEC) 40 MG capsule Take 40 mg by mouth daily.    . simvastatin (ZOCOR) 20 MG tablet Take 20 mg by mouth daily at 6 PM. Reported on 08/10/2015     No current facility-administered medications for this visit.    Review of Systems Review of Systems  Constitutional: Negative.   Respiratory: Negative.   Cardiovascular: Negative.     Blood pressure 138/76, pulse 68, resp. rate 14, height _0  (1.575 m), weight 170 lb (77.111 kg).  Physical Exam Physical Exam  Constitutional: She is oriented to person, place, and time. She appears well-developed and well-nourished.  Eyes: Conjunctivae are normal. No scleral icterus.  Neck: Neck supple.  Cardiovascular: Normal rate and regular rhythm.    Murmur heard.  Systolic murmur is present with a grade of 3/6  Pulmonary/Chest: Effort normal and breath sounds normal. Right breast exhibits no inverted nipple, no mass, no nipple discharge, no skin change and no tenderness. Left breast exhibits no inverted nipple, no mass, no nipple discharge, no skin change and no tenderness.  Mild radiation changes right breast.   Abdominal: Soft. Normal appearance and bowel sounds are normal. There is no hepatomegaly. There is no tenderness.  Lymphadenopathy:    She has no cervical adenopathy.    She has no axillary adenopathy.  Neurological: She is alert and oriented to person, place, and time.  Skin: Skin is warm and dry.    Data Reviewed Mammogram reviewed - no new findings  Assessment    CA right breast, 1 yr post lumpectomy/radiation. Currently on Letrazole and doing well. History of left breast cancer     Plan   The patient has been asked to return to the office in one year with a bilateral diagnostic mammogram.    PCP:  Kary Kos This information has been scribed by Gaspar Cola CMA.     Khayden Herzberg G 08/10/2015, 10:47 AM

## 2015-09-08 ENCOUNTER — Other Ambulatory Visit: Payer: Self-pay | Admitting: Oncology

## 2015-10-14 ENCOUNTER — Other Ambulatory Visit: Payer: Self-pay | Admitting: Oncology

## 2015-11-12 ENCOUNTER — Other Ambulatory Visit: Payer: Self-pay | Admitting: Oncology

## 2015-11-30 ENCOUNTER — Encounter: Payer: Self-pay | Admitting: Hematology and Oncology

## 2015-11-30 ENCOUNTER — Inpatient Hospital Stay: Payer: Medicare Other | Attending: Hematology and Oncology

## 2015-11-30 ENCOUNTER — Other Ambulatory Visit: Payer: Self-pay | Admitting: Hematology and Oncology

## 2015-11-30 ENCOUNTER — Inpatient Hospital Stay (HOSPITAL_BASED_OUTPATIENT_CLINIC_OR_DEPARTMENT_OTHER): Payer: Medicare Other | Admitting: Hematology and Oncology

## 2015-11-30 VITALS — BP 160/70 | HR 57 | Temp 97.6°F | Resp 17 | Wt 179.5 lb

## 2015-11-30 DIAGNOSIS — Z79811 Long term (current) use of aromatase inhibitors: Secondary | ICD-10-CM | POA: Diagnosis not present

## 2015-11-30 DIAGNOSIS — Z170 Estrogen receptor positive status [ER+]: Secondary | ICD-10-CM | POA: Insufficient documentation

## 2015-11-30 DIAGNOSIS — D509 Iron deficiency anemia, unspecified: Secondary | ICD-10-CM

## 2015-11-30 DIAGNOSIS — Z923 Personal history of irradiation: Secondary | ICD-10-CM | POA: Diagnosis not present

## 2015-11-30 DIAGNOSIS — Z800 Family history of malignant neoplasm of digestive organs: Secondary | ICD-10-CM | POA: Diagnosis not present

## 2015-11-30 DIAGNOSIS — Z853 Personal history of malignant neoplasm of breast: Secondary | ICD-10-CM | POA: Insufficient documentation

## 2015-11-30 DIAGNOSIS — C50211 Malignant neoplasm of upper-inner quadrant of right female breast: Secondary | ICD-10-CM

## 2015-11-30 DIAGNOSIS — J449 Chronic obstructive pulmonary disease, unspecified: Secondary | ICD-10-CM

## 2015-11-30 DIAGNOSIS — Z79899 Other long term (current) drug therapy: Secondary | ICD-10-CM

## 2015-11-30 DIAGNOSIS — Z7982 Long term (current) use of aspirin: Secondary | ICD-10-CM | POA: Diagnosis not present

## 2015-11-30 DIAGNOSIS — Z803 Family history of malignant neoplasm of breast: Secondary | ICD-10-CM | POA: Insufficient documentation

## 2015-11-30 DIAGNOSIS — Z9223 Personal history of estrogen therapy: Secondary | ICD-10-CM | POA: Diagnosis not present

## 2015-11-30 DIAGNOSIS — Z87311 Personal history of (healed) other pathological fracture: Secondary | ICD-10-CM | POA: Insufficient documentation

## 2015-11-30 LAB — CBC WITH DIFFERENTIAL/PLATELET
Basophils Absolute: 0.2 10*3/uL — ABNORMAL HIGH (ref 0–0.1)
Basophils Relative: 2 %
Eosinophils Absolute: 0.3 10*3/uL (ref 0–0.7)
Eosinophils Relative: 3 %
HCT: 37.1 % (ref 35.0–47.0)
Hemoglobin: 13.2 g/dL (ref 12.0–16.0)
Lymphocytes Relative: 18 %
Lymphs Abs: 1.7 10*3/uL (ref 1.0–3.6)
MCH: 32.6 pg (ref 26.0–34.0)
MCHC: 35.5 g/dL (ref 32.0–36.0)
MCV: 91.9 fL (ref 80.0–100.0)
Monocytes Absolute: 1 10*3/uL — ABNORMAL HIGH (ref 0.2–0.9)
Monocytes Relative: 11 %
Neutro Abs: 6.3 10*3/uL (ref 1.4–6.5)
Neutrophils Relative %: 66 %
Platelets: 274 10*3/uL (ref 150–440)
RBC: 4.03 MIL/uL (ref 3.80–5.20)
RDW: 12.8 % (ref 11.5–14.5)
WBC: 9.6 10*3/uL (ref 3.6–11.0)

## 2015-11-30 LAB — FERRITIN: Ferritin: 16 ng/mL (ref 11–307)

## 2015-11-30 LAB — IRON AND TIBC
Iron: 89 ug/dL (ref 28–170)
Saturation Ratios: 22 % (ref 10.4–31.8)
TIBC: 410 ug/dL (ref 250–450)
UIBC: 321 ug/dL

## 2015-11-30 LAB — COMPREHENSIVE METABOLIC PANEL
ALT: 19 U/L (ref 14–54)
AST: 25 U/L (ref 15–41)
Albumin: 3.9 g/dL (ref 3.5–5.0)
Alkaline Phosphatase: 52 U/L (ref 38–126)
Anion gap: 8 (ref 5–15)
BUN: 23 mg/dL — ABNORMAL HIGH (ref 6–20)
CO2: 27 mmol/L (ref 22–32)
Calcium: 8.9 mg/dL (ref 8.9–10.3)
Chloride: 98 mmol/L — ABNORMAL LOW (ref 101–111)
Creatinine, Ser: 0.92 mg/dL (ref 0.44–1.00)
GFR calc Af Amer: >60 mL/min (ref >60)
GFR calc non Af Amer: 54 mL/min — ABNORMAL LOW (ref >60)
Glucose, Bld: 127 mg/dL — ABNORMAL HIGH (ref 65–99)
Potassium: 3.6 mmol/L (ref 3.5–5.1)
Sodium: 133 mmol/L — ABNORMAL LOW (ref 135–145)
Total Bilirubin: 0.5 mg/dL (ref 0.3–1.2)
Total Protein: 7.5 g/dL (ref 6.5–8.1)

## 2015-11-30 NOTE — Progress Notes (Signed)
[facility] day:  11/30/2015  Chief Complaint: Kristy Hardy is a 82 y.o. female with a history of bilateral breast cancer who is seen for reassessment.  HPI:  The patient was diagnosed with left breast cancer 22 years ago.  She underwent lumpectomy and radiation.  Details of her cancer are unknown.  She states that she was on tamoxifen for 2 years.  The patient was diagnosed with right breast cancer after she presented with an abnormal mammogram.  At the 2 o'clock position, there was an abnormal 8 mm mass.  Biopsy on 08/12/2014 revealed graded I invasive mammary carcinoma.  There was DCIS, intermediate grade, solid type. Tumor was ER positive (> 90%), PR positive (> 90%), and Her2/neu 2+ (FISH negative).    She underwent wide excision on 09/09/2014 by Dr. Jamal Collin.  Pathology revealed a 1.4 cm invasive mammary carcinoma.  There was grade II DCIS, cribriform and solid types.  Margins were negative.  There was no lymphovascular invasion.   One sentinel lymph node was negative.  Stage was T1cN0M0.  She received radiation.  She was started on Femara.  Bilateral mammogram on 08/05/2015 revealed no evidence of malignancy.  She has a history of T12 compression fracture s/p kyphoplasty on 03/01/2012.  She was last seen by Dr. Forest Gleason on 02/03/2015.  At that time, she noted weakness with palpitations for 2-3 weeks.  Labs were notable for a hematocrit of 30.5, hemoglobin 10.1, MCV 81.1, platelets 357,000, WBC 7400 with an ANC of 4700.  Sodium was 130 and creatinine was 1.17.  CA27.29 was 11.1.    Guaiac cards x 3 were performed (1 of 3 positive).  Labs on 02/24/2015 revealed a hematocrit of 36.6, hemoglobin 11.8, and MCV 82.1.  Ferritin was 13 with a saturation of 62% and TIBC 492 (high).  She is not taking oral iron.  Symptomatically, she is doing well. She continues to take her Femara. She notes hot flashes once in a while. She describes some  cramps in her legs. She denies any pain.   Past Medical History  Diagnosis Date  . Arthritis   . Pneumonia July 2014  . COPD (chronic obstructive pulmonary disease) ([place])   . Heart valve problem     leaking  . History of colonoscopy 2012  . History of mammogram 2016  . Cancer ([place]) 11-11-91    left breast   . Cancer ([place]) 08-12-14    right breast, ER/PR positive, Her2 negative  . Breast cancer ([place])   . Carcinoma of left breast ([place])     Left breast carcinoma; patient had lumpectomy radiation therapy   . Carcinoma of right breast ([place]) 10/12/2014    Abnormal mammogram followed by biopsy of the right breast.  A 2:00 position patient had an abnormal 74m mass which has been biopsied.  Was positive for invasive carcinoma.  Estrogen and progesterone receptor and HER-2/neu receptor pending (March, 2016)    Past Surgical History  Procedure Laterality Date  . Colonoscopy    . Cholecystectomy  02/23/1972  . Breast surgery Left 11-11-91    lumpectomy with radiation, Tamoxifen for 2 years.Dr. CSharlet Salina . Breast surgery Right 09/09/14    RIGHT BREAST WIDE EXCISION   . Breast biopsy Bilateral [id number]   Positive    Family History  Problem Relation Age of Onset  . Colon cancer Mother 661 . Colon cancer Brother 75 . Breast cancer Sister 338  Social History:  reports that she has never smoked. She has never used smokeless tobacco. She reports that she does not drink alcohol or use illicit drugs.  The patient lives alone in [place].  Her husband died 67 years ago.  She is a Nature conservation officer at [employer].  The patient is alone today.  Allergies:  Allergies  Allergen Reactions  . Amoxicillin-Pot Clavulanate Nausea And Vomiting  . Other Nausea And Vomiting    Current Medications: Current Outpatient Prescriptions  Medication Sig Dispense Refill  . acetaminophen (TYLENOL) 650 MG CR tablet Take by mouth as needed.     . Albuterol Sulfate (PROAIR HFA IN) Inhale into the lungs as needed.    Marland Kitchen  amLODipine (NORVASC) 5 MG tablet Take 5 mg by mouth daily.     Marland Kitchen aspirin 81 MG tablet Take 81 mg by mouth daily.    . Ferrous Gluconate 325 (36 FE) MG TABS Take 1 tablet by mouth 2 (two) times daily. 60 tablet 3  . fluticasone (FLONASE) 50 MCG/ACT nasal spray Place 1 spray into both nostrils daily. As needed for seasonal allergies  11  . letrozole (FEMARA) 2.5 MG tablet TAKE 1 TABLET(2.5 MG) BY MOUTH DAILY 30 tablet 0  . LORazepam (ATIVAN) 1 MG tablet Take 1 tablet (1 mg total) by mouth at bedtime as needed for anxiety. 30 tablet 3  . losartan-hydrochlorothiazide (HYZAAR) 50-12.5 MG per tablet Take 1 tablet by mouth daily.     . Magnesium 250 MG TABS Take 1 tablet by mouth daily. As needed for indigestion    . metoprolol succinate (TOPROL-XL) 50 MG 24 hr tablet Take 50 mg by mouth daily.     Marland Kitchen omeprazole (PRILOSEC) 40 MG capsule Take 40 mg by mouth daily.     No current facility-administered medications for this visit.    Review of Systems:  GENERAL: energy level is improving.  Active.  No fevers, sweats or weight loss. PERFORMANCE STATUS (ECOG):  1 HEENT:  Needs dnetal work done.  No visual changes, runny nose, sore throat, mouth sores or tenderness. Lungs: No shortness of breath or cough.  No hemoptysis. Cardiac:  No chest pain, palpitations, orthopnea, or PND. GI:  Appetite is good.  No nausea, vomiting, diarrhea, constipation, melena or hematochezia. GU:  No urgency, frequency, dysuria, or hematuria. Musculoskeletal:  No back pain.  No joint pain.  No muscle tenderness. Extremities:  Cramps in legs.  No swelling. Skin:  No rashes or skin changes. Neuro:  No headache, numbness or weakness, balance or coordination issues. Endocrine:  No diabetes, thyroid issues, hot flashes or night sweats. Psych:  No mood changes, depression or anxiety. Pain:  No focal pain. Review of systems:  All other systems reviewed and found to be negative.  Physical Exam: Blood pressure 160/70, pulse 57,  temperature 97.6 F (36.4 C), temperature source Oral, resp. rate 17, weight 179 lb 7.3 oz (81.4 kg). GENERAL:  Well developed, well nourished, sitting comfortably in the exam room in no acute distress. MENTAL STATUS:  Alert and oriented to person, place and time. HEAD:  Pearline Cables styled hair.  Normocephalic, atraumatic, face symmetric, no Cushingoid features. EYES:  Hazel eyes.  Pupils equal round and reactive to light and accomodation.  No conjunctivitis or scleral icterus. ENT:  Oropharynx clear without lesion.  Dentures.  Tongue normal. Mucous membranes moist.  RESPIRATORY:  Clear to auscultation without rales, wheezes or rhonchi. CARDIOVASCULAR:  Regular rate and rhythm without murmur, rub or gallop. BREAST:  Right breast with  post-operative changes. No masses, skin changes or nipple discharge.  Left breast smaller.  No masses, skin changes or nipple discharge.  ABDOMEN:  Soft, non-tender, with active bowel sounds, and no hepatosplenomegaly.  No masses. SKIN:  No rashes, ulcers or lesions. EXTREMITIES: Arthritic changes in hands.  No edema, no skin discoloration or tenderness.  No palpable cords. LYMPH NODES: No palpable cervical, supraclavicular, axillary or inguinal adenopathy  NEUROLOGICAL: Unremarkable. PSYCH:  Appropriate.  Appointment on 11/30/2015  Component Date Value Ref Range Status  . WBC 11/30/2015 9.6  3.6 - 11.0 K/uL Final  . RBC 11/30/2015 4.03  3.80 - 5.20 MIL/uL Final  . Hemoglobin 11/30/2015 13.2  12.0 - 16.0 g/dL Final  . HCT 11/30/2015 37.1  35.0 - 47.0 % Final  . MCV 11/30/2015 91.9  80.0 - 100.0 fL Final  . MCH 11/30/2015 32.6  26.0 - 34.0 pg Final  . MCHC 11/30/2015 35.5  32.0 - 36.0 g/dL Final  . RDW 11/30/2015 12.8  11.5 - 14.5 % Final  . Platelets 11/30/2015 274  150 - 440 K/uL Final  . Neutrophils Relative % 11/30/2015 66   Final  . Neutro Abs 11/30/2015 6.3  1.4 - 6.5 K/uL Final  . Lymphocytes Relative 11/30/2015 18   Final  . Lymphs Abs 11/30/2015 1.7  1.0  - 3.6 K/uL Final  . Monocytes Relative 11/30/2015 11   Final  . Monocytes Absolute 11/30/2015 1.0* 0.2 - 0.9 K/uL Final  . Eosinophils Relative 11/30/2015 3   Final  . Eosinophils Absolute 11/30/2015 0.3  0 - 0.7 K/uL Final  . Basophils Relative 11/30/2015 2   Final  . Basophils Absolute 11/30/2015 0.2* 0 - 0.1 K/uL Final  . Sodium 11/30/2015 133* 135 - 145 mmol/L Final  . Potassium 11/30/2015 3.6  3.5 - 5.1 mmol/L Final  . Chloride 11/30/2015 98* 101 - 111 mmol/L Final  . CO2 11/30/2015 27  22 - 32 mmol/L Final  . Glucose, Bld 11/30/2015 127* 65 - 99 mg/dL Final  . BUN 11/30/2015 23* 6 - 20 mg/dL Final  . Creatinine, Ser 11/30/2015 0.92  0.44 - 1.00 mg/dL Final  . Calcium 11/30/2015 8.9  8.9 - 10.3 mg/dL Final  . Total Protein 11/30/2015 7.5  6.5 - 8.1 g/dL Final  . Albumin 11/30/2015 3.9  3.5 - 5.0 g/dL Final  . AST 11/30/2015 25  15 - 41 U/L Final  . ALT 11/30/2015 19  14 - 54 U/L Final  . Alkaline Phosphatase 11/30/2015 52  38 - 126 U/L Final  . Total Bilirubin 11/30/2015 0.5  0.3 - 1.2 mg/dL Final  . GFR calc non Af Amer 11/30/2015 54* >60 mL/min Final  . GFR calc Af Amer 11/30/2015 >60  >60 mL/min Final   Comment: (NOTE) The eGFR has been calculated using the CKD EPI equation. This calculation has not been validated in all clinical situations. eGFR's persistently <60 mL/min signify possible Chronic Kidney Disease.   . Anion gap 11/30/2015 8  5 - 15 Final  . Ferritin 11/30/2015 16  11 - 307 ng/mL Final  . Iron 11/30/2015 89  28 - 170 ug/dL Final  . TIBC 11/30/2015 410  250 - 450 ug/dL Final  . Saturation Ratios 11/30/2015 22  10.4 - 31.8 % Final  . UIBC 11/30/2015 321   Final    Assessment:  Kristy Hardy is a 82 y.o. female with a history of bilateral breast cancer.  She was diagnosed with left breast cancer 22 years ago.  She underwent lumpectomy and radiation.  Details of her cancer are unknown.  She was on tamoxifen for 2 years.  She was diagnosed with right  breast cancer in 2016.  She underwent wide excision on 09/09/2014.  Pathology revealed a 1.4 cm invasive mammary carcinoma.  There was grade II DCIS, cribriform and solid types.  Margins were negative.  There was no lymphovascular invasion.   One sentinel lymph node was negative.  Stage was T1cN0M0.  Tumor was ER positive (> 90%), PR positive (> 90%), and Her2/neu 2+ (FISH negative).  Stage was T1cN0M0.  She received radiation.  She was started on Femara.    Bilateral mammogram on 08/05/2015 revealed no evidence of malignancy.  CA27.29 was 11.1 on 02/03/2015 and 20.5 on 11/30/2015.  She was diagnosed with iron deficiency anemia in 2016.  Hematocrit of 30.5, hemoglobin 10.1, and MCV 81.1 on 02/03/2015.  Ferritin was 13 with a saturation of 62% and TIBC 492 (high) on 02/24/2015.  Colonoscopy was in 10/2009.  Guaiac cards x 3 were performed (1 of 3 positive).    She has a history of T12 compression fracture s/p kyphoplasty on 03/01/2012.  Symptomatically, she is doing well. She remains on Femara. She has hot flashes once in a while. Exam is unremarkable.  Plan: 1.  Review entire medical history, diagnosis and management of breast cancer, and diagnosis and management of iron deficiency anemia. 2.  Labs today:  CBC with diff, CMP, CA27.29, ferritin, iron studies. 3.  Schedule bone density study. 4.  RTC in 4 months for MD assessment and labs (CBC with diff, CMP, CA27.29 and ferritin).   Lequita Asal, MD  11/30/2015, 12:29 PM

## 2015-11-30 NOTE — Progress Notes (Signed)
6 month Breast cancer follow up visit. Taking letrozole daily. Has had some hot flashes lately once in awhile. Does have cramps in both legs.Appetite is good. Energy is improving. Works as a Psychologist, occupational in hospital presently 1 day a week.  Denies any actual pain anywhere. Patient needs to have dental work done on a bad tooth down in the root. Wonders if it is okay to have that done while she is taking femara.

## 2015-12-01 LAB — CANCER ANTIGEN 27.29: CA 27.29: 20.5 U/mL (ref 0.0–38.6)

## 2015-12-03 ENCOUNTER — Telehealth: Payer: Self-pay

## 2015-12-03 NOTE — Telephone Encounter (Signed)
Patient had appointment on Monday, has decided to go ahead and take bone density test.  Patient thought she was supposed to come to the [facility] for the test gave her the proper location.  Patient stated that she understood

## 2015-12-10 ENCOUNTER — Other Ambulatory Visit: Payer: Medicare Other

## 2015-12-10 ENCOUNTER — Ambulatory Visit: Payer: Medicare Other | Admitting: Oncology

## 2015-12-15 ENCOUNTER — Other Ambulatory Visit: Payer: Self-pay | Admitting: Family Medicine

## 2015-12-16 ENCOUNTER — Other Ambulatory Visit: Payer: Self-pay

## 2015-12-16 ENCOUNTER — Telehealth: Payer: Self-pay | Admitting: Hematology and Oncology

## 2015-12-16 ENCOUNTER — Other Ambulatory Visit: Payer: Self-pay | Admitting: Hematology and Oncology

## 2015-12-16 MED ORDER — LETROZOLE 2.5 MG PO TABS: ORAL_TABLET | ORAL | 0 refills | Status: DC

## 2015-12-16 NOTE — Telephone Encounter (Signed)
Patient lvm in [place] stating that she needs refill on her cancer pill and the pharmacy told her it hasn't been called in yet. Thanks.

## 2015-12-16 NOTE — Telephone Encounter (Signed)
Patient lvm in [place] stating that she needs refill on her cancer pill and the pharmacy told her it hasn't been called in yet. Thanks.

## 2015-12-18 NOTE — Telephone Encounter (Signed)
I called pharmacy and gave refills as well as called pt but only got voice mail on both home and mobile and left message that I called refills in for her to pharmacy

## 2015-12-23 ENCOUNTER — Ambulatory Visit: Payer: Medicare Other

## 2016-01-13 ENCOUNTER — Other Ambulatory Visit: Payer: Self-pay | Admitting: *Deleted

## 2016-01-13 MED ORDER — LETROZOLE 2.5 MG PO TABS: ORAL_TABLET | ORAL | 0 refills | Status: DC

## 2016-01-13 NOTE — Telephone Encounter (Signed)
Called for refill and to request change in oncologist to Dr Rogue Bussing. Message sent to schedulers to change MD and call patient with the time and date of her appt

## 2016-03-18 ENCOUNTER — Encounter: Payer: Self-pay | Admitting: *Deleted

## 2016-04-01 ENCOUNTER — Other Ambulatory Visit: Payer: Self-pay | Admitting: *Deleted

## 2016-04-01 ENCOUNTER — Inpatient Hospital Stay: Payer: Medicare Other | Attending: Internal Medicine | Admitting: Internal Medicine

## 2016-04-01 ENCOUNTER — Inpatient Hospital Stay: Payer: Medicare Other

## 2016-04-01 DIAGNOSIS — M1990 Unspecified osteoarthritis, unspecified site: Secondary | ICD-10-CM | POA: Insufficient documentation

## 2016-04-01 DIAGNOSIS — D509 Iron deficiency anemia, unspecified: Secondary | ICD-10-CM | POA: Insufficient documentation

## 2016-04-01 DIAGNOSIS — Z800 Family history of malignant neoplasm of digestive organs: Secondary | ICD-10-CM | POA: Insufficient documentation

## 2016-04-01 DIAGNOSIS — Z79811 Long term (current) use of aromatase inhibitors: Secondary | ICD-10-CM

## 2016-04-01 DIAGNOSIS — J449 Chronic obstructive pulmonary disease, unspecified: Secondary | ICD-10-CM | POA: Insufficient documentation

## 2016-04-01 DIAGNOSIS — Z803 Family history of malignant neoplasm of breast: Secondary | ICD-10-CM | POA: Insufficient documentation

## 2016-04-01 DIAGNOSIS — C170 Malignant neoplasm of duodenum: Secondary | ICD-10-CM | POA: Diagnosis not present

## 2016-04-01 DIAGNOSIS — Z170 Estrogen receptor positive status [ER+]: Secondary | ICD-10-CM | POA: Diagnosis not present

## 2016-04-01 DIAGNOSIS — I10 Essential (primary) hypertension: Secondary | ICD-10-CM | POA: Diagnosis not present

## 2016-04-01 DIAGNOSIS — Z79899 Other long term (current) drug therapy: Secondary | ICD-10-CM | POA: Insufficient documentation

## 2016-04-01 DIAGNOSIS — Z7982 Long term (current) use of aspirin: Secondary | ICD-10-CM | POA: Insufficient documentation

## 2016-04-01 DIAGNOSIS — Z923 Personal history of irradiation: Secondary | ICD-10-CM

## 2016-04-01 DIAGNOSIS — Z880 Allergy status to penicillin: Secondary | ICD-10-CM

## 2016-04-01 DIAGNOSIS — Z9223 Personal history of estrogen therapy: Secondary | ICD-10-CM | POA: Diagnosis not present

## 2016-04-01 DIAGNOSIS — C50211 Malignant neoplasm of upper-inner quadrant of right female breast: Secondary | ICD-10-CM | POA: Diagnosis not present

## 2016-04-01 DIAGNOSIS — Z853 Personal history of malignant neoplasm of breast: Secondary | ICD-10-CM | POA: Diagnosis not present

## 2016-04-01 LAB — COMPREHENSIVE METABOLIC PANEL
ALT: 18 U/L (ref 14–54)
AST: 26 U/L (ref 15–41)
Albumin: 4.1 g/dL (ref 3.5–5.0)
Alkaline Phosphatase: 57 U/L (ref 38–126)
Anion gap: 8 (ref 5–15)
BUN: 19 mg/dL (ref 6–20)
CO2: 25 mmol/L (ref 22–32)
Calcium: 9.1 mg/dL (ref 8.9–10.3)
Chloride: 99 mmol/L — ABNORMAL LOW (ref 101–111)
Creatinine, Ser: 0.89 mg/dL (ref 0.44–1.00)
GFR calc Af Amer: >60 mL/min (ref >60)
GFR calc non Af Amer: 56 mL/min — ABNORMAL LOW (ref >60)
Glucose, Bld: 109 mg/dL — ABNORMAL HIGH (ref 65–99)
Potassium: 3.9 mmol/L (ref 3.5–5.1)
Sodium: 132 mmol/L — ABNORMAL LOW (ref 135–145)
Total Bilirubin: 0.5 mg/dL (ref 0.3–1.2)
Total Protein: 7.6 g/dL (ref 6.5–8.1)

## 2016-04-01 LAB — CBC WITH DIFFERENTIAL/PLATELET
Basophils Absolute: 0.2 10*3/uL — ABNORMAL HIGH (ref 0–0.1)
Basophils Relative: 2 %
Eosinophils Absolute: 0.2 10*3/uL (ref 0–0.7)
Eosinophils Relative: 3 %
HCT: 35 % (ref 35.0–47.0)
Hemoglobin: 12 g/dL (ref 12.0–16.0)
Lymphocytes Relative: 22 %
Lymphs Abs: 1.6 10*3/uL (ref 1.0–3.6)
MCH: 30.1 pg (ref 26.0–34.0)
MCHC: 34.4 g/dL (ref 32.0–36.0)
MCV: 87.4 fL (ref 80.0–100.0)
Monocytes Absolute: 0.8 10*3/uL (ref 0.2–0.9)
Monocytes Relative: 11 %
Neutro Abs: 4.6 10*3/uL (ref 1.4–6.5)
Neutrophils Relative %: 62 %
Platelets: 300 10*3/uL (ref 150–440)
RBC: 4.01 MIL/uL (ref 3.80–5.20)
RDW: 12.9 % (ref 11.5–14.5)
WBC: 7.4 10*3/uL (ref 3.6–11.0)

## 2016-04-01 NOTE — Progress Notes (Signed)
Patient had a echo - yesterday. She expreses that she feels "jittery today." did not eat any food thi morning. Has not had any bp medications this morning.  bp 202/78.  Rechecked bp 203 / 78.  Offered patient a snack today since she has not eaten. She declined.

## 2016-04-01 NOTE — Progress Notes (Signed)
[facility] OFFICE PROGRESS NOTE  Patient Care Team: [name], MD as PCP - General (Family Medicine) Seeplaputhur Robinette Haines, MD (General Surgery) Forest Gleason, MD (Oncology)  Carcinoma of right breast [facility])   Staging form: Breast, AJCC 7th Edition   - Clinical stage from 09/19/2014: Stage IA (yT1c, N0, M0) - Unsigned   Oncology History   # March, 2016- LEFT BREAST INVASIVE CA;  Lumpec & sentinel lymph node biopsy [Dr.sankar].  Low-grade tumor estrogen and progesterone receptor positive HER-2 receptor negative T1 cN0 M0.STAGE I S/p RT. Femara   # 2.  Left breast carcinoma breast 22 years ago patient had lumpectomy radiation therapy and 2 years of tamoxifen.     Carcinoma of right breast ([place])   10/12/2014 Initial Diagnosis    Carcinoma of left breast       Carcinoma of upper-inner quadrant of right breast in female, estrogen receptor positive ([place])   04/01/2016 Initial Diagnosis    Carcinoma of upper-inner quadrant of right breast in female, estrogen receptor positive ([place])       This is my first interaction with the patient as patient's primary oncologist has been Dr.Choksi. I reviewed the patient's prior charts/pertinent labs/imaging in detail; findings are summarized above.     INTERVAL HISTORY:  Kristy Hardy 82 y.o.  female pleasant patient above history of Breast cancer is here for follow-up.  Patient denies any lumps or bumps. Her appetite is good. No chest pain or shortness of breath or cough. No bone pain. Patient complains of being anxious; jittery. She has not taken her blood pressure medication at home.   REVIEW OF SYSTEMS:  A complete 10 point review of system is done which is negative except mentioned above/history of present illness.   PAST MEDICAL HISTORY :  Past Medical History:  Diagnosis Date  . Arthritis   . Breast cancer ([place])   . Cancer ([place]) 11-11-91   left breast   . Cancer ([place]) 08-12-14   right breast, ER/PR  positive, Her2 negative  . Carcinoma of left breast ([place])    Left breast carcinoma; patient had lumpectomy radiation therapy   . Carcinoma of right breast ([place]) 10/12/2014   Abnormal mammogram followed by biopsy of the right breast.  A 2:00 position patient had an abnormal 74m mass which has been biopsied.  Was positive for invasive carcinoma.  Estrogen and progesterone receptor and HER-2/neu receptor pending (March, 2016)  . COPD (chronic obstructive pulmonary disease) ([place]   . Heart valve problem    leaking  . History of colonoscopy 2012  . History of mammogram 2016  . Pneumonia July 2014    PAST SURGICAL HISTORY :   Past Surgical History:  Procedure Laterality Date  . BREAST BIOPSY Bilateral [id number]  Positive  . BREAST SURGERY Left 11-11-91   lumpectomy with radiation, Tamoxifen for 2 years.Dr. CSharlet Salina . BREAST SURGERY Right 09/09/14   RIGHT BREAST WIDE EXCISION   . CHOLECYSTECTOMY  02/23/1972  . COLONOSCOPY      FAMILY HISTORY :   Family History  Problem Relation Age of Onset  . Colon cancer Mother 680 . Colon cancer Brother 75 . Breast cancer Sister 333   SOCIAL HISTORY:   Social History  Substance Use Topics  . Smoking status: Never Smoker  . Smokeless tobacco: Never Used  . Alcohol use No    ALLERGIES:  is allergic to amoxicillin-pot clavulanate and other.  MEDICATIONS:  Current Outpatient Prescriptions  Medication Sig Dispense Refill  . acetaminophen (TYLENOL) 650 MG CR tablet Take by mouth as needed.     . Albuterol Sulfate (PROAIR HFA IN) Inhale into the lungs as needed.    Marland Kitchen amLODipine (NORVASC) 5 MG tablet Take 5 mg by mouth daily.     Marland Kitchen aspirin 81 MG tablet Take 81 mg by mouth daily.    Marland Kitchen letrozole (FEMARA) 2.5 MG tablet TAKE 1 TABLET(2.5 MG) BY MOUTH DAILY 90 tablet 0  . LORazepam (ATIVAN) 1 MG tablet Take 1 tablet (1 mg total) by mouth at bedtime as needed for anxiety. 30 tablet 3  . losartan-hydrochlorothiazide (HYZAAR) 50-12.5 MG per tablet  Take 1 tablet by mouth daily.     . metoprolol succinate (TOPROL-XL) 50 MG 24 hr tablet Take 50 mg by mouth daily.     Marland Kitchen omeprazole (PRILOSEC) 40 MG capsule Take 40 mg by mouth daily.    . Cyanocobalamin (RA VITAMIN B-12 TR) 1000 MCG TBCR Take 1 tablet by mouth daily.    . Ferrous Gluconate 325 (36 FE) MG TABS Take 1 tablet by mouth 2 (two) times daily. (Patient not taking: Reported on 04/01/2016) 60 tablet 3  . fluticasone (FLONASE) 50 MCG/ACT nasal spray Place 1 spray into both nostrils daily. As needed for seasonal allergies  11  . Magnesium 250 MG TABS Take 1 tablet by mouth daily. As needed for indigestion     No current facility-administered medications for this visit.     PHYSICAL EXAMINATION: ECOG PERFORMANCE STATUS: 0 - Asymptomatic  BP (!) 202/78 (BP Location: Right Arm, Patient Position: Sitting)   Pulse 66   Temp 97.6 F (36.4 C) (Tympanic)   Resp 20   Ht '5\' 2"'  (1.575 m)   Wt 180 lb (81.6 kg)   BMI 32.92 kg/m   Filed Weights   04/01/16 1050  Weight: 180 lb (81.6 kg)    GENERAL: Well-nourished well-developed; Alert, no distress and comfortable.   Alone.  EYES: no pallor or icterus OROPHARYNX: no thrush or ulceration; good dentition  NECK: supple, no masses felt LYMPH:  no palpable lymphadenopathy in the cervical, axillary or inguinal regions LUNGS: clear to auscultation and  No wheeze or crackles HEART/CVS: regular rate & rhythm and no murmurs; No lower extremity edema ABDOMEN:abdomen soft, non-tender and normal bowel sounds Musculoskeletal:no cyanosis of digits and no clubbing  PSYCH: alert & oriented x 3 with fluent speech NEURO: no focal motor/sensory deficits SKIN:  no rashes or significant lesions Right and left BREAST exam [in the presence of nurse]- no unusual skin changes or dominant masses felt. Surgical scars noted.    LABORATORY DATA:  I have reviewed the data as listed    Component Value Date/Time   NA 132 (L) 04/01/2016 1000   NA 135 09/01/2014  1506   K 3.9 04/01/2016 1000   K 4.1 09/01/2014 1506   CL 99 (L) 04/01/2016 1000   CL 98 (L) 09/01/2014 1506   CO2 25 04/01/2016 1000   CO2 30 09/01/2014 1506   GLUCOSE 109 (H) 04/01/2016 1000   GLUCOSE 114 (H) 09/01/2014 1506   BUN 19 04/01/2016 1000   BUN 19 09/01/2014 1506   CREATININE 0.89 04/01/2016 1000   CREATININE 0.91 09/01/2014 1506   CALCIUM 9.1 04/01/2016 1000   CALCIUM 9.5 09/01/2014 1506   PROT 7.6 04/01/2016 1000   PROT 7.8 09/01/2014 1506   ALBUMIN 4.1 04/01/2016 1000   ALBUMIN 4.2 09/01/2014 1506   AST 26 04/01/2016 1000  AST 29 09/01/2014 1506   ALT 18 04/01/2016 1000   ALT 19 09/01/2014 1506   ALKPHOS 57 04/01/2016 1000   ALKPHOS 65 09/01/2014 1506   BILITOT 0.5 04/01/2016 1000   BILITOT 0.5 09/01/2014 1506   GFRNONAA 56 (L) 04/01/2016 1000   GFRNONAA 57 (L) 09/01/2014 1506   GFRAA >60 04/01/2016 1000   GFRAA >60 09/01/2014 1506    No results found for: SPEP, UPEP  Lab Results  Component Value Date   WBC 7.4 04/01/2016   NEUTROABS 4.6 04/01/2016   HGB 12.0 04/01/2016   HCT 35.0 04/01/2016   MCV 87.4 04/01/2016   PLT 300 04/01/2016      Chemistry      Component Value Date/Time   NA 132 (L) 04/01/2016 1000   NA 135 09/01/2014 1506   K 3.9 04/01/2016 1000   K 4.1 09/01/2014 1506   CL 99 (L) 04/01/2016 1000   CL 98 (L) 09/01/2014 1506   CO2 25 04/01/2016 1000   CO2 30 09/01/2014 1506   BUN 19 04/01/2016 1000   BUN 19 09/01/2014 1506   CREATININE 0.89 04/01/2016 1000   CREATININE 0.91 09/01/2014 1506      Component Value Date/Time   CALCIUM 9.1 04/01/2016 1000   CALCIUM 9.5 09/01/2014 1506   ALKPHOS 57 04/01/2016 1000   ALKPHOS 65 09/01/2014 1506   AST 26 04/01/2016 1000   AST 29 09/01/2014 1506   ALT 18 04/01/2016 1000   ALT 19 09/01/2014 1506   BILITOT 0.5 04/01/2016 1000   BILITOT 0.5 09/01/2014 1506       RADIOGRAPHIC STUDIES: I have personally reviewed the radiological images as listed and agreed with the findings in the  report. No results found.   ASSESSMENT & PLAN:  Carcinoma of upper-inner quadrant of right breast in female, estrogen receptor positive ([place]) # Breast cancer- Stage I- ER/PR positive; Her 2 neu- Neg; on femara; ca+vit D. clinically NED.  # Hx of Anemia- hold Iron pills; causes constipation; Hb 12.1 today.   # Elevated Blood pressure-recommend Blood pressure medications taking as soon as she goes home.   # Follow up in 6 months/labs.    Orders Placed This Encounter  Procedures  . CBC with Differential    Standing Status:   Future    Standing Expiration Date:   04/01/2017  . Basic metabolic panel    Standing Status:   Future    Standing Expiration Date:   04/01/2017   All questions were answered. The patient knows to call the clinic with any problems, questions or concerns.      Cammie Sickle, MD 04/01/2016 3:13 PM

## 2016-04-01 NOTE — Assessment & Plan Note (Addendum)
#   Breast cancer- Stage I- ER/PR positive; Her 2 neu- Neg; on femara; ca+vit D. clinically NED.  # Hx of Anemia- hold Iron pills; causes constipation; Hb 12.1 today.   # Elevated Blood pressure-recommend Blood pressure medications taking as soon as she goes home.   # Follow up in 6 months/labs.

## 2016-04-02 LAB — CA 27.29 (SERIAL MONITOR): CA 27.29: 14.4 U/mL (ref 0.0–38.6)

## 2016-04-18 ENCOUNTER — Other Ambulatory Visit: Payer: Self-pay | Admitting: Hematology and Oncology

## 2016-04-18 ENCOUNTER — Other Ambulatory Visit: Payer: Self-pay | Admitting: Internal Medicine

## 2016-06-21 ENCOUNTER — Other Ambulatory Visit: Payer: Self-pay

## 2016-06-21 DIAGNOSIS — C50211 Malignant neoplasm of upper-inner quadrant of right female breast: Secondary | ICD-10-CM

## 2016-07-08 ENCOUNTER — Ambulatory Visit: Payer: Medicare Other | Admitting: Radiation Oncology

## 2016-07-18 ENCOUNTER — Ambulatory Visit
Admission: RE | Admit: 2016-07-18 | Discharge: 2016-07-18 | Disposition: A | Discharge status: Home / Self Care | Payer: Medicare Other | Source: Ambulatory Visit | Attending: Radiation Oncology | Admitting: Radiation Oncology

## 2016-07-18 ENCOUNTER — Encounter: Payer: Self-pay | Admitting: Radiation Oncology

## 2016-07-18 VITALS — BP 136/73 | HR 106 | Temp 97.9°F | Resp 20 | Wt 174.6 lb

## 2016-07-18 DIAGNOSIS — Z170 Estrogen receptor positive status [ER+]: Secondary | ICD-10-CM | POA: Insufficient documentation

## 2016-07-18 DIAGNOSIS — R0602 Shortness of breath: Secondary | ICD-10-CM | POA: Diagnosis not present

## 2016-07-18 DIAGNOSIS — I10 Essential (primary) hypertension: Secondary | ICD-10-CM | POA: Insufficient documentation

## 2016-07-18 DIAGNOSIS — C50211 Malignant neoplasm of upper-inner quadrant of right female breast: Secondary | ICD-10-CM | POA: Insufficient documentation

## 2016-07-18 DIAGNOSIS — Z923 Personal history of irradiation: Secondary | ICD-10-CM | POA: Insufficient documentation

## 2016-07-18 DIAGNOSIS — Z79811 Long term (current) use of aromatase inhibitors: Secondary | ICD-10-CM | POA: Insufficient documentation

## 2016-07-18 DIAGNOSIS — D649 Anemia, unspecified: Secondary | ICD-10-CM | POA: Diagnosis not present

## 2016-07-18 NOTE — Progress Notes (Signed)
Radiation Oncology Follow up Note  Name: Anner Baity   Date:   07/18/2016 MRN:  [id number] DOB: 10/04/1924    This 83 y.o. female presents to the clinic today for one half year follow-up status post whole breast radiation to her right breast for stage I invasive mammary carcinoma ER/PR positive HER-2/neu negative.  REFERRING PROVIDER: Forest Gleason, MD  HPI: Patient is a 83 year old female now out a year and a half having completed radiation therapy to her right breast for stage I ER/PR positive HER-2/neu negative invasive mammary carcinoma status post wide local excision and sentinel node biopsy. She seen today in routine follow-up and is doing well she is having some problems with increased dyspnea on exertion. She does have problems with hypertension as well as anemia. She specifically denies breast tenderness cough or bone pain.  COMPLICATIONS OF TREATMENT: none  FOLLOW UP COMPLIANCE: keeps appointments   PHYSICAL EXAM:  BP 136/73   Pulse (!) 106   Temp 97.9 F (36.6 C)   Resp 20   Wt 174 lb 9.7 oz (79.2 kg)   BMI 31.94 kg/m  Lungs are clear to A&P cardiac examination essentially unremarkable with regular rate and rhythm. No dominant mass or nodularity is noted in either breast in 2 positions examined. Incision is well-healed. No axillary or supraclavicular adenopathy is appreciated. Cosmetic result is excellent. Well-developed well-nourished patient in NAD. HEENT reveals PERLA, EOMI, discs not visualized.  Oral cavity is clear. No oral mucosal lesions are identified. Neck is clear without evidence of cervical or supraclavicular adenopathy. Lungs are clear to A&P. Cardiac examination is essentially unremarkable with regular rate and rhythm without murmur rub or thrill. Abdomen is benign with no organomegaly or masses noted. Motor sensory and DTR levels are equal and symmetric in the upper and lower extremities. Cranial nerves II through XII are grossly intact. Proprioception  is intact. No peripheral adenopathy or edema is identified. No motor or sensory levels are noted. Crude visual fields are within normal range.  RADIOLOGY RESULTS: Mammograms are reviewed and compatible with the above-stated findings  PLAN: At the present time patient is doing well from a breast standpoint. She is seeing her cardiology physician tomorrow to discuss the dyspnea on exertion. I've assured her this is most likely a cardiac problem rather than having anything to do with her breast cancer. She's currently on Femara and tolerating that well without side effect. I've asked to see her back in 1 year for follow-up. Patient knows to call with any concerns.  I would like to take this opportunity to thank you for allowing me to participate in the care of your patient.Armstead Peaks., MD

## 2016-07-20 ENCOUNTER — Ambulatory Visit: Payer: Medicare Other | Admitting: Radiation Oncology

## 2016-07-26 ENCOUNTER — Other Ambulatory Visit: Payer: Self-pay | Admitting: Internal Medicine

## 2016-08-08 ENCOUNTER — Ambulatory Visit
Admission: RE | Admit: 2016-08-08 | Discharge: 2016-08-08 | Disposition: A | Discharge status: Home / Self Care | Payer: Medicare Other | Source: Ambulatory Visit | Attending: General Surgery | Admitting: General Surgery

## 2016-08-08 ENCOUNTER — Other Ambulatory Visit: Payer: Self-pay | Admitting: General Surgery

## 2016-08-08 DIAGNOSIS — C50211 Malignant neoplasm of upper-inner quadrant of right female breast: Secondary | ICD-10-CM | POA: Diagnosis not present

## 2016-08-09 ENCOUNTER — Encounter: Payer: Self-pay | Admitting: *Deleted

## 2016-08-16 ENCOUNTER — Encounter: Payer: Self-pay | Admitting: General Surgery

## 2016-08-16 ENCOUNTER — Ambulatory Visit (INDEPENDENT_AMBULATORY_CARE_PROVIDER_SITE_OTHER): Payer: Medicare Other | Admitting: General Surgery

## 2016-08-16 VITALS — BP 134/64 | HR 60 | Resp 14 | Ht 64.0 in | Wt 178.0 lb

## 2016-08-16 DIAGNOSIS — Z853 Personal history of malignant neoplasm of breast: Secondary | ICD-10-CM | POA: Diagnosis not present

## 2016-08-16 DIAGNOSIS — C50211 Malignant neoplasm of upper-inner quadrant of right female breast: Secondary | ICD-10-CM | POA: Diagnosis not present

## 2016-08-16 DIAGNOSIS — Z170 Estrogen receptor positive status [ER+]: Secondary | ICD-10-CM | POA: Diagnosis not present

## 2016-08-16 NOTE — Patient Instructions (Signed)
The patient has been asked to return to the office in one year with a bilateral diagnostic mammogram with Dr. Byrnett. 

## 2016-08-16 NOTE — Progress Notes (Signed)
Patient ID: Kristy Hardy, female   DOB: 19-Feb-1925, 83 y.o.   MRN: [id number]  Chief Complaint  Patient presents with  . Follow-up    mammmogram    HPI Kristy Hardy is a 83 y.o. female who presents for a breast cancer follow up. The most recent mammogram was done on 08/08/2016  Patient does perform regular self breast checks and gets regular mammograms done.   Pt still is able to drive and take care of herself. HPI  Past Medical History:  Diagnosis Date  . Arthritis   . Breast cancer ([place])   . Cancer ([place]) 11-11-91   left breast   . Cancer ([place]) 08-12-14   right breast, ER/PR positive, Her2 negative  . Carcinoma of left breast ([place])    Left breast carcinoma; patient had lumpectomy radiation therapy   . COPD (chronic obstructive pulmonary disease) ([place])   . Heart valve problem    leaking  . History of colonoscopy 2012  . History of mammogram 2016  . Pneumonia July 2014    Past Surgical History:  Procedure Laterality Date  . BREAST BIOPSY Bilateral [id number]   Positive  . BREAST SURGERY Left 11-11-91   lumpectomy with radiation, Tamoxifen for 2 years.Dr. Sharlet Salina  . BREAST SURGERY Right 09/09/14   RIGHT BREAST WIDE EXCISION   . CHOLECYSTECTOMY  02/23/1972  . COLONOSCOPY      Family History  Problem Relation Age of Onset  . Colon cancer Mother 15  . Colon cancer Brother 57  . Breast cancer Sister 38    Social History Social History  Substance Use Topics  . Smoking status: Never Smoker  . Smokeless tobacco: Never Used  . Alcohol use No    Allergies  Allergen Reactions  . Amoxicillin-Pot Clavulanate Nausea And Vomiting  . Other Nausea And Vomiting    Current Outpatient Prescriptions  Medication Sig Dispense Refill  . acetaminophen (TYLENOL) 650 MG CR tablet Take by mouth as needed.     . Albuterol Sulfate (PROAIR HFA IN) Inhale into the lungs as needed.    Marland Kitchen amLODipine (NORVASC) 5 MG tablet Take 5 mg by mouth daily.     Marland Kitchen aspirin 81 MG  tablet Take 81 mg by mouth daily.    . Cyanocobalamin (RA VITAMIN B-12 TR) 1000 MCG TBCR Take 1 tablet by mouth daily.    . Ferrous Gluconate 325 (36 FE) MG TABS Take 1 tablet by mouth 2 (two) times daily. 60 tablet 3  . fluticasone (FLONASE) 50 MCG/ACT nasal spray Place 1 spray into both nostrils daily. As needed for seasonal allergies  11  . guaiFENesin (MUCINEX) 600 MG 12 hr tablet Take by mouth 2 (two) times daily as needed.    Marland Kitchen letrozole (FEMARA) 2.5 MG tablet TAKE 1 TABLET(2.5 MG) BY MOUTH DAILY 90 tablet 0  . LORazepam (ATIVAN) 1 MG tablet Take 1 tablet (1 mg total) by mouth at bedtime as needed for anxiety. 30 tablet 3  . losartan-hydrochlorothiazide (HYZAAR) 50-12.5 MG per tablet Take 1 tablet by mouth daily.     . Magnesium 250 MG TABS Take 1 tablet by mouth daily. As needed for indigestion    . metoprolol succinate (TOPROL-XL) 50 MG 24 hr tablet Take 50 mg by mouth daily.     Marland Kitchen omeprazole (PRILOSEC) 40 MG capsule Take 40 mg by mouth daily.    Marland Kitchen PROAIR HFA 108 (90 Base) MCG/ACT inhaler     . traMADol (ULTRAM) 50 MG tablet  No current facility-administered medications for this visit.     Review of Systems Review of Systems  Constitutional: Negative.   Respiratory: Negative.   Cardiovascular: Negative.     Blood pressure 134/64, pulse 60, resp. rate 14, height '5\' 4"'  (1.626 m), weight 178 lb (80.7 kg).  Physical Exam Physical Exam  Constitutional: She is oriented to person, place, and time. She appears well-developed and well-nourished.  Eyes: Conjunctivae are normal. No scleral icterus.  Neck: Neck supple.  Cardiovascular: Normal rate and regular rhythm.   Murmur heard.  Systolic murmur is present with a grade of 3/6  Pulmonary/Chest: Effort normal and breath sounds normal. Right breast exhibits no inverted nipple, no mass, no nipple discharge, no skin change and no tenderness. Left breast exhibits no inverted nipple, no mass, no nipple discharge, no skin change and no  tenderness.  Abdominal: Soft. Normal appearance and bowel sounds are normal. There is no hepatomegaly. There is no tenderness. No hernia.  Lymphadenopathy:    She has no cervical adenopathy.  Neurological: She is alert and oriented to person, place, and time.  Skin: Skin is warm and dry.    Data Reviewed Mammogram and prior notes reviewed  Assessment    CA right breast, 2 yr post lumpectomy/radiation. Currently on Letrazole and doing well. History of left breast cancer     Plan    The patient has been asked to return to the office in one year with a bilateral diagnostic mammogram with Dr. Bary Castilla. If her performance level decreases mammogram can be held. Pt advised to cal if there is any change in her condition. This information has been scribed by Gaspar Cola CMA.        Kimberlie Csaszar G 08/17/2016, 4:21 PM

## 2016-09-29 ENCOUNTER — Inpatient Hospital Stay: Payer: Medicare Other | Attending: Internal Medicine

## 2016-09-29 ENCOUNTER — Inpatient Hospital Stay (HOSPITAL_BASED_OUTPATIENT_CLINIC_OR_DEPARTMENT_OTHER): Payer: Medicare Other | Admitting: Internal Medicine

## 2016-09-29 VITALS — BP 156/75 | HR 60 | Temp 97.6°F | Resp 20 | Ht 64.0 in | Wt 178.0 lb

## 2016-09-29 DIAGNOSIS — Z853 Personal history of malignant neoplasm of breast: Secondary | ICD-10-CM | POA: Diagnosis not present

## 2016-09-29 DIAGNOSIS — R42 Dizziness and giddiness: Secondary | ICD-10-CM | POA: Insufficient documentation

## 2016-09-29 DIAGNOSIS — Z79899 Other long term (current) drug therapy: Secondary | ICD-10-CM

## 2016-09-29 DIAGNOSIS — D509 Iron deficiency anemia, unspecified: Secondary | ICD-10-CM

## 2016-09-29 DIAGNOSIS — Z9223 Personal history of estrogen therapy: Secondary | ICD-10-CM | POA: Insufficient documentation

## 2016-09-29 DIAGNOSIS — Z79811 Long term (current) use of aromatase inhibitors: Secondary | ICD-10-CM | POA: Diagnosis not present

## 2016-09-29 DIAGNOSIS — Z170 Estrogen receptor positive status [ER+]: Secondary | ICD-10-CM | POA: Diagnosis not present

## 2016-09-29 DIAGNOSIS — C50211 Malignant neoplasm of upper-inner quadrant of right female breast: Secondary | ICD-10-CM | POA: Diagnosis not present

## 2016-09-29 DIAGNOSIS — Z880 Allergy status to penicillin: Secondary | ICD-10-CM | POA: Diagnosis not present

## 2016-09-29 DIAGNOSIS — Z800 Family history of malignant neoplasm of digestive organs: Secondary | ICD-10-CM | POA: Diagnosis not present

## 2016-09-29 DIAGNOSIS — Z923 Personal history of irradiation: Secondary | ICD-10-CM | POA: Insufficient documentation

## 2016-09-29 DIAGNOSIS — J449 Chronic obstructive pulmonary disease, unspecified: Secondary | ICD-10-CM | POA: Diagnosis not present

## 2016-09-29 DIAGNOSIS — R067 Sneezing: Secondary | ICD-10-CM | POA: Diagnosis not present

## 2016-09-29 DIAGNOSIS — Z803 Family history of malignant neoplasm of breast: Secondary | ICD-10-CM | POA: Insufficient documentation

## 2016-09-29 DIAGNOSIS — Z7982 Long term (current) use of aspirin: Secondary | ICD-10-CM

## 2016-09-29 LAB — BASIC METABOLIC PANEL
ANION GAP: 6 (ref 5–15)
BUN: 16 mg/dL (ref 6–20)
CALCIUM: 9.2 mg/dL (ref 8.9–10.3)
CO2: 29 mmol/L (ref 22–32)
Chloride: 99 mmol/L — ABNORMAL LOW (ref 101–111)
Creatinine, Ser: 1 mg/dL (ref 0.44–1.00)
GFR calc Af Amer: 56 mL/min — ABNORMAL LOW (ref >60)
GFR calc non Af Amer: 48 mL/min — ABNORMAL LOW (ref >60)
Glucose, Bld: 103 mg/dL — ABNORMAL HIGH (ref 65–99)
Potassium: 3.6 mmol/L (ref 3.5–5.1)
SODIUM: 134 mmol/L — AB (ref 135–145)

## 2016-09-29 LAB — CBC WITH DIFFERENTIAL/PLATELET
BASOS ABS: 0.2 10*3/uL — AB (ref 0–0.1)
BASOS PCT: 2 %
EOS PCT: 3 %
Eosinophils Absolute: 0.3 10*3/uL (ref 0–0.7)
HCT: 35.3 % (ref 35.0–47.0)
Hemoglobin: 11.8 g/dL — ABNORMAL LOW (ref 12.0–16.0)
Lymphocytes Relative: 20 %
Lymphs Abs: 1.5 10*3/uL (ref 1.0–3.6)
MCH: 27.9 pg (ref 26.0–34.0)
MCHC: 33.5 g/dL (ref 32.0–36.0)
MCV: 83.4 fL (ref 80.0–100.0)
Monocytes Absolute: 0.9 10*3/uL (ref 0.2–0.9)
Monocytes Relative: 12 %
NEUTROS ABS: 4.9 10*3/uL (ref 1.4–6.5)
Neutrophils Relative %: 63 %
PLATELETS: 274 10*3/uL (ref 150–440)
RBC: 4.23 MIL/uL (ref 3.80–5.20)
RDW: 17.9 % — AB (ref 11.5–14.5)
WBC: 7.8 10*3/uL (ref 3.6–11.0)

## 2016-09-29 LAB — IRON AND TIBC
IRON: 116 ug/dL (ref 28–170)
Saturation Ratios: 27 % (ref 10.4–31.8)
TIBC: 428 ug/dL (ref 250–450)
UIBC: 312 ug/dL

## 2016-09-29 LAB — FERRITIN: Ferritin: 13 ng/mL (ref 11–307)

## 2016-09-29 NOTE — Assessment & Plan Note (Addendum)
#   Breast cancer- Stage I- ER/PR positive; Her 2 neu- Neg; on femara; ca+vit D. clinically NED.  # Hx of Anemia- Hb today- 11.8; on Iron once a day; check iron studies/ferritin today. If have intol- then IV Venofer [relcutant]  # Vertigo/dizzy- "sinuses"/sneezing- recommend claritin.  Continue   # Follow up in 6 months/labs.  

## 2016-09-29 NOTE — Progress Notes (Signed)
[facility] OFFICE PROGRESS NOTE  Patient Care Team: [name], MD as PCP - General (Family Medicine) Christene Lye, MD (General Surgery) Forest Gleason, MD (Oncology)  Cancer Staging Carcinoma of right breast [facility]) Staging form: Breast, AJCC 7th Edition - Clinical stage from 09/19/2014: Stage IA (yT1c, N0, M0) - Unsigned    Oncology History   # March, 2016- LEFT BREAST INVASIVE CA;  Lumpec & sentinel lymph node biopsy [Dr.sankar].  Low-grade tumor estrogen and progesterone receptor positive HER-2 receptor negative T1 cN0 M0.STAGE I S/p RT. Femara  # 2.  Left breast carcinoma breast 22 years ago patient had lumpectomy radiation therapy and 2 years of tamoxifen.  # IDA chronic-      Carcinoma of right breast ([place])   10/12/2014 Initial Diagnosis    Carcinoma of left breast       Carcinoma of upper-inner quadrant of right breast in female, estrogen receptor positive ([place])      INTERVAL HISTORY:  Kristy Hardy 83 y.o.  female pleasant patient above history of Breast cancer is here for follow-up.  Patient denies any lumps or bumps. Her appetite is good. No chest pain or shortness of breath or cough. No bone pain.Denies any blood in stools or black colored stools. She takes 1 iron pill a day. She complains of intermittent vertigo which is chronic. Complains of sneezing and itchy eyes.  REVIEW OF SYSTEMS:  A complete 10 point review of system is done which is negative except mentioned above/history of present illness.   PAST MEDICAL HISTORY :  Past Medical History:  Diagnosis Date  . Arthritis   . Breast cancer ([place])   . Cancer ([place]) 11-11-91   left breast   . Cancer ([place]) 08-12-14   right breast, ER/PR positive, Her2 negative  . Carcinoma of left breast ([place])    Left breast carcinoma; patient had lumpectomy radiation therapy   . COPD (chronic obstructive pulmonary disease) ([place])   . Heart valve problem    leaking  . History of  colonoscopy 2012  . History of mammogram 2016  . Pneumonia July 2014    PAST SURGICAL HISTORY :   Past Surgical History:  Procedure Laterality Date  . BREAST BIOPSY Bilateral [id number]   Positive  . BREAST SURGERY Left 11-11-91   lumpectomy with radiation, Tamoxifen for 2 years.Dr. Sharlet Salina  . BREAST SURGERY Right 09/09/14   RIGHT BREAST WIDE EXCISION   . CHOLECYSTECTOMY  02/23/1972  . COLONOSCOPY      FAMILY HISTORY :   Family History  Problem Relation Age of Onset  . Colon cancer Mother 35  . Colon cancer Brother 37  . Breast cancer Sister 15    SOCIAL HISTORY:   Social History  Substance Use Topics  . Smoking status: Never Smoker  . Smokeless tobacco: Never Used  . Alcohol use No    ALLERGIES:  is allergic to amoxicillin-pot clavulanate and other.  MEDICATIONS:  Current Outpatient Prescriptions  Medication Sig Dispense Refill  . acetaminophen (TYLENOL) 650 MG CR tablet Take by mouth as needed.     Marland Kitchen amLODipine (NORVASC) 5 MG tablet Take 5 mg by mouth daily.     Marland Kitchen aspirin 81 MG tablet Take 81 mg by mouth daily.    . Cyanocobalamin (RA VITAMIN B-12 TR) 1000 MCG TBCR Take 1 tablet by mouth daily.    . Ferrous Gluconate 325 (36 FE) MG TABS Take 1 tablet by mouth 2 (two) times daily. (Patient taking differently:  Take 1 tablet by mouth daily. ) 60 tablet 3  . fluticasone (FLONASE) 50 MCG/ACT nasal spray Place 1 spray into both nostrils daily. As needed for seasonal allergies  11  . letrozole (FEMARA) 2.5 MG tablet TAKE 1 TABLET(2.5 MG) BY MOUTH DAILY 90 tablet 0  . LORazepam (ATIVAN) 1 MG tablet Take 1 tablet (1 mg total) by mouth at bedtime as needed for anxiety. 30 tablet 3  . losartan-hydrochlorothiazide (HYZAAR) 50-12.5 MG per tablet Take 1 tablet by mouth daily.     . Magnesium 250 MG TABS Take 1 tablet by mouth daily. As needed for indigestion    . metoprolol succinate (TOPROL-XL) 50 MG 24 hr tablet Take 50 mg by mouth daily.     Marland Kitchen omeprazole (PRILOSEC) 40 MG capsule  Take 40 mg by mouth daily.    Marland Kitchen PROAIR HFA 108 (90 Base) MCG/ACT inhaler Inhale 1 puff into the lungs every 6 (six) hours as needed for wheezing or shortness of breath.     . simvastatin (ZOCOR) 20 MG tablet Take 20 mg by mouth daily.    Marland Kitchen guaiFENesin (MUCINEX) 600 MG 12 hr tablet Take by mouth 2 (two) times daily as needed.     No current facility-administered medications for this visit.     PHYSICAL EXAMINATION: ECOG PERFORMANCE STATUS: 0 - Asymptomatic  BP (!) 156/75 (Patient Position: Sitting)   Pulse 60   Temp 97.6 F (36.4 C) (Tympanic)   Resp 20   Ht _0  (1.626 m)   Wt 178 lb (80.7 kg)   SpO2 95%   BMI 30.55 kg/m   Filed Weights   09/29/16 1027  Weight: 178 lb (80.7 kg)    GENERAL: Well-nourished well-developed; Alert, no distress and comfortable.   Alone.  EYES: no pallor or icterus OROPHARYNX: no thrush or ulceration; good dentition  NECK: supple, no masses felt LYMPH:  no palpable lymphadenopathy in the cervical, axillary or inguinal regions LUNGS: clear to auscultation and  No wheeze or crackles HEART/CVS: regular rate & rhythm and no murmurs; No lower extremity edema ABDOMEN:abdomen soft, non-tender and normal bowel sounds Musculoskeletal:no cyanosis of digits and no clubbing  PSYCH: alert & oriented x 3 with fluent speech NEURO: no focal motor/sensory deficits SKIN:  no rashes or significant lesions Right and left BREAST exam [in the presence of nurse]- no unusual skin changes or dominant masses felt. Surgical scars noted.    LABORATORY DATA:  I have reviewed the data as listed    Component Value Date/Time   NA 134 (L) 09/29/2016 0920   NA 135 09/01/2014 1506   K 3.6 09/29/2016 0920   K 4.1 09/01/2014 1506   CL 99 (L) 09/29/2016 0920   CL 98 (L) 09/01/2014 1506   CO2 29 09/29/2016 0920   CO2 30 09/01/2014 1506   GLUCOSE 103 (H) 09/29/2016 0920   GLUCOSE 114 (H) 09/01/2014 1506   BUN 16 09/29/2016 0920   BUN 19 09/01/2014 1506   CREATININE 1.00  09/29/2016 0920   CREATININE 0.91 09/01/2014 1506   CALCIUM 9.2 09/29/2016 0920   CALCIUM 9.5 09/01/2014 1506   PROT 7.6 04/01/2016 1000   PROT 7.8 09/01/2014 1506   ALBUMIN 4.1 04/01/2016 1000   ALBUMIN 4.2 09/01/2014 1506   AST 26 04/01/2016 1000   AST 29 09/01/2014 1506   ALT 18 04/01/2016 1000   ALT 19 09/01/2014 1506   ALKPHOS 57 04/01/2016 1000   ALKPHOS 65 09/01/2014 1506   BILITOT 0.5 04/01/2016 1000  BILITOT 0.5 09/01/2014 1506   GFRNONAA 48 (L) 09/29/2016 0920   GFRNONAA 57 (L) 09/01/2014 1506   GFRAA 56 (L) 09/29/2016 0920   GFRAA >60 09/01/2014 1506    No results found for: SPEP, UPEP  Lab Results  Component Value Date   WBC 7.8 09/29/2016   NEUTROABS 4.9 09/29/2016   HGB 11.8 (L) 09/29/2016   HCT 35.3 09/29/2016   MCV 83.4 09/29/2016   PLT 274 09/29/2016      Chemistry      Component Value Date/Time   NA 134 (L) 09/29/2016 0920   NA 135 09/01/2014 1506   K 3.6 09/29/2016 0920   K 4.1 09/01/2014 1506   CL 99 (L) 09/29/2016 0920   CL 98 (L) 09/01/2014 1506   CO2 29 09/29/2016 0920   CO2 30 09/01/2014 1506   BUN 16 09/29/2016 0920   BUN 19 09/01/2014 1506   CREATININE 1.00 09/29/2016 0920   CREATININE 0.91 09/01/2014 1506      Component Value Date/Time   CALCIUM 9.2 09/29/2016 0920   CALCIUM 9.5 09/01/2014 1506   ALKPHOS 57 04/01/2016 1000   ALKPHOS 65 09/01/2014 1506   AST 26 04/01/2016 1000   AST 29 09/01/2014 1506   ALT 18 04/01/2016 1000   ALT 19 09/01/2014 1506   BILITOT 0.5 04/01/2016 1000   BILITOT 0.5 09/01/2014 1506       RADIOGRAPHIC STUDIES: I have personally reviewed the radiological images as listed and agreed with the findings in the report. No results found.   ASSESSMENT & PLAN:  Carcinoma of upper-inner quadrant of right breast in female, estrogen receptor positive ([place]) # Breast cancer- Stage I- ER/PR positive; Her 2 neu- Neg; on femara; ca+vit D. clinically NED.  # Hx of Anemia- Hb today- 11.8; on Iron once a day;  check iron studies/ferritin today. If have intol- then IV Venofer [relcutant]  # Vertigo/dizzy- "sinuses"/sneezing- recommend claritin.  Continue   # Follow up in 6 months/labs.    Orders Placed This Encounter  Procedures  . Ferritin    Standing Status:   Future    Number of Occurrences:   1    Standing Expiration Date:   09/29/2017  . Iron and TIBC    Standing Status:   Future    Number of Occurrences:   1    Standing Expiration Date:   09/29/2017  . CBC with Differential    Standing Status:   Future    Standing Expiration Date:   09/29/2017  . Basic metabolic panel    Standing Status:   Future    Standing Expiration Date:   09/29/2017  . Ferritin    Standing Status:   Future    Standing Expiration Date:   09/29/2017  . Iron and TIBC    Standing Status:   Future    Standing Expiration Date:   09/29/2017   All questions were answered. The patient knows to call the clinic with any problems, questions or concerns.      Cammie Sickle, MD 09/30/2016 4:35 PM

## 2016-09-29 NOTE — Progress Notes (Signed)
Patient reports episodes of vertigo last evening and again this morning. She took 1 Antivert tablet last night and again this morning, which she noted only slight improvement. Patient states "I feel like I'm getting a sinus infection. I have sinus pressure. I have not taking any mucinex yesterday. I have a post nasal drip in the back throat." pt denies fever or sore throat. She has noted a slight change in her voice. Pt states that she is short of breath - h/o COPD. Stats today are 95% RA. She will see Dr. Ubaldo Glassing on 10/11/16 for a "history of heart valve problems". Pt does not use any oxygen therapy at home. She uses the albuterol inhaler as needed.  Pt declines breast exam today. She states that she was just evaluated by Dr. Jamal Collin in March. Her last mammogram was in March 2018. Dr. Bary Castilla will see her back in 1 years time. She does not need any RFs on Femara.  She is requesting ferr, and iibc to be added to her labs. She is concerned that she should be taking 2 oral iron tablets. Patient encouraged that her hgb has come up from 8.9 to 11.8.  RN spoke with Dr. Rogue Bussing - ok to add ferr and iibc to pt's labs today.

## 2016-10-29 ENCOUNTER — Other Ambulatory Visit: Payer: Self-pay | Admitting: Internal Medicine

## 2017-01-11 ENCOUNTER — Other Ambulatory Visit: Payer: Self-pay | Admitting: Neurology

## 2017-01-11 DIAGNOSIS — R413 Other amnesia: Secondary | ICD-10-CM

## 2017-01-11 DIAGNOSIS — Z8249 Family history of ischemic heart disease and other diseases of the circulatory system: Secondary | ICD-10-CM

## 2017-01-11 DIAGNOSIS — I671 Cerebral aneurysm, nonruptured: Secondary | ICD-10-CM

## 2017-01-11 DIAGNOSIS — R42 Dizziness and giddiness: Secondary | ICD-10-CM

## 2017-01-17 ENCOUNTER — Ambulatory Visit: Payer: Medicare Other

## 2017-01-17 ENCOUNTER — Ambulatory Visit
Admission: RE | Admit: 2017-01-17 | Discharge: 2017-01-17 | Disposition: A | Discharge status: Home / Self Care | Payer: Medicare Other | Source: Ambulatory Visit | Attending: Neurology | Admitting: Neurology

## 2017-01-17 DIAGNOSIS — I671 Cerebral aneurysm, nonruptured: Secondary | ICD-10-CM | POA: Insufficient documentation

## 2017-01-17 DIAGNOSIS — Z8249 Family history of ischemic heart disease and other diseases of the circulatory system: Secondary | ICD-10-CM | POA: Diagnosis not present

## 2017-01-17 DIAGNOSIS — J320 Chronic maxillary sinusitis: Secondary | ICD-10-CM | POA: Diagnosis not present

## 2017-01-17 DIAGNOSIS — R42 Dizziness and giddiness: Secondary | ICD-10-CM | POA: Diagnosis present

## 2017-01-17 DIAGNOSIS — R413 Other amnesia: Secondary | ICD-10-CM | POA: Diagnosis present

## 2017-04-03 ENCOUNTER — Inpatient Hospital Stay: Payer: Medicare Other

## 2017-04-03 ENCOUNTER — Inpatient Hospital Stay: Payer: Medicare Other | Admitting: Internal Medicine

## 2017-04-03 NOTE — Assessment & Plan Note (Deleted)
#   Breast cancer- Stage I- ER/PR positive; Her 2 neu- Neg; on femara; ca+vit D. clinically NED.  # Hx of Anemia- Hb today- 11.8; on Iron once a day; check iron studies/ferritin today. If have intol- then IV Venofer [relcutant]  # Vertigo/dizzy- "sinuses"/sneezing- recommend claritin.  Continue   # Follow up in 6 months/labs.

## 2017-04-03 NOTE — Progress Notes (Deleted)
[place] OFFICE PROGRESS NOTE  Patient Care Team: [name], MD as PCP - General (Family Medicine) Christene Lye, MD (General Surgery) Forest Gleason, MD (Oncology)  Cancer Staging Carcinoma of right breast [facility]) Staging form: Breast, AJCC 7th Edition - Clinical stage from 09/19/2014: Stage IA (yT1c, N0, M0) - Unsigned    Oncology History   # March, 2016- LEFT BREAST INVASIVE CA;  Lumpec & sentinel lymph node biopsy [Dr.sankar].  Low-grade tumor estrogen and progesterone receptor positive HER-2 receptor negative T1 cN0 M0.STAGE I S/p RT. Femara  # 2.  Left breast carcinoma breast 22 years ago patient had lumpectomy radiation therapy and 2 years of tamoxifen.  # IDA chronic-      Carcinoma of right breast ([place])   10/12/2014 Initial Diagnosis    Carcinoma of left breast       Carcinoma of upper-inner quadrant of right breast in female, estrogen receptor positive ([place])      INTERVAL HISTORY:  Kristy Hardy 83 y.o.  female pleasant patient above history of Breast cancer is here for follow-up.  Patient denies any lumps or bumps. Her appetite is good. No chest pain or shortness of breath or cough. No bone pain.Denies any blood in stools or black colored stools. She takes 1 iron pill a day. She complains of intermittent vertigo which is chronic. Complains of sneezing and itchy eyes.  REVIEW OF SYSTEMS:  A complete 10 point review of system is done which is negative except mentioned above/history of present illness.   PAST MEDICAL HISTORY :  Past Medical History:  Diagnosis Date  . Arthritis   . Breast cancer ([place])   . Cancer ([place]) 11-11-91   left breast   . Cancer ([place]) 08-12-14   right breast, ER/PR positive, Her2 negative  . Carcinoma of left breast ([place])    Left breast carcinoma; patient had lumpectomy radiation therapy   . COPD (chronic obstructive pulmonary disease) ([place])   . Heart valve problem    leaking  . History of  colonoscopy 2012  . History of mammogram 2016  . Pneumonia July 2014    PAST SURGICAL HISTORY :   Past Surgical History:  Procedure Laterality Date  . BREAST BIOPSY Bilateral [id number]   Positive  . BREAST SURGERY Left 11-11-91   lumpectomy with radiation, Tamoxifen for 2 years.Dr. Sharlet Salina  . BREAST SURGERY Right 09/09/14   RIGHT BREAST WIDE EXCISION   . CHOLECYSTECTOMY  02/23/1972  . COLONOSCOPY      FAMILY HISTORY :   Family History  Problem Relation Age of Onset  . Colon cancer Mother 64  . Colon cancer Brother 58  . Breast cancer Sister 87    SOCIAL HISTORY:   Social History   Tobacco Use  . Smoking status: Never Smoker  . Smokeless tobacco: Never Used  Substance Use Topics  . Alcohol use: No    Alcohol/week: 0.0 oz  . Drug use: No    ALLERGIES:  is allergic to amoxicillin-pot clavulanate and other.  MEDICATIONS:  Current Outpatient Medications  Medication Sig Dispense Refill  . acetaminophen (TYLENOL) 650 MG CR tablet Take by mouth as needed.     Marland Kitchen amLODipine (NORVASC) 5 MG tablet Take 5 mg by mouth daily.     Marland Kitchen aspirin 81 MG tablet Take 81 mg by mouth daily.    . Cyanocobalamin (RA VITAMIN B-12 TR) 1000 MCG TBCR Take 1 tablet by mouth daily.    . Ferrous Gluconate 325 (36  FE) MG TABS Take 1 tablet by mouth 2 (two) times daily. (Patient taking differently: Take 1 tablet by mouth daily. ) 60 tablet 3  . fluticasone (FLONASE) 50 MCG/ACT nasal spray Place 1 spray into both nostrils daily. As needed for seasonal allergies  11  . guaiFENesin (MUCINEX) 600 MG 12 hr tablet Take by mouth 2 (two) times daily as needed.    Marland Kitchen letrozole (FEMARA) 2.5 MG tablet TAKE 1 TABLET(2.5 MG) BY MOUTH DAILY 270 tablet 0  . LORazepam (ATIVAN) 1 MG tablet Take 1 tablet (1 mg total) by mouth at bedtime as needed for anxiety. 30 tablet 3  . losartan-hydrochlorothiazide (HYZAAR) 50-12.5 MG per tablet Take 1 tablet by mouth daily.     . Magnesium 250 MG TABS Take 1 tablet by mouth daily. As  needed for indigestion    . metoprolol succinate (TOPROL-XL) 50 MG 24 hr tablet Take 50 mg by mouth daily.     Marland Kitchen omeprazole (PRILOSEC) 40 MG capsule Take 40 mg by mouth daily.    Marland Kitchen PROAIR HFA 108 (90 Base) MCG/ACT inhaler Inhale 1 puff into the lungs every 6 (six) hours as needed for wheezing or shortness of breath.     . simvastatin (ZOCOR) 20 MG tablet Take 20 mg by mouth daily.     No current facility-administered medications for this visit.     PHYSICAL EXAMINATION: ECOG PERFORMANCE STATUS: 0 - Asymptomatic  There were no vitals taken for this visit.  There were no vitals filed for this visit.  GENERAL: Well-nourished well-developed; Alert, no distress and comfortable.   Alone.  EYES: no pallor or icterus OROPHARYNX: no thrush or ulceration; good dentition  NECK: supple, no masses felt LYMPH:  no palpable lymphadenopathy in the cervical, axillary or inguinal regions LUNGS: clear to auscultation and  No wheeze or crackles HEART/CVS: regular rate & rhythm and no murmurs; No lower extremity edema ABDOMEN:abdomen soft, non-tender and normal bowel sounds Musculoskeletal:no cyanosis of digits and no clubbing  PSYCH: alert & oriented x 3 with fluent speech NEURO: no focal motor/sensory deficits SKIN:  no rashes or significant lesions Right and left BREAST exam [in the presence of nurse]- no unusual skin changes or dominant masses felt. Surgical scars noted.    LABORATORY DATA:  I have reviewed the data as listed    Component Value Date/Time   NA 134 (L) 09/29/2016 0920   NA 135 09/01/2014 1506   K 3.6 09/29/2016 0920   K 4.1 09/01/2014 1506   CL 99 (L) 09/29/2016 0920   CL 98 (L) 09/01/2014 1506   CO2 29 09/29/2016 0920   CO2 30 09/01/2014 1506   GLUCOSE 103 (H) 09/29/2016 0920   GLUCOSE 114 (H) 09/01/2014 1506   BUN 16 09/29/2016 0920   BUN 19 09/01/2014 1506   CREATININE 1.00 09/29/2016 0920   CREATININE 0.91 09/01/2014 1506   CALCIUM 9.2 09/29/2016 0920   CALCIUM 9.5  09/01/2014 1506   PROT 7.6 04/01/2016 1000   PROT 7.8 09/01/2014 1506   ALBUMIN 4.1 04/01/2016 1000   ALBUMIN 4.2 09/01/2014 1506   AST 26 04/01/2016 1000   AST 29 09/01/2014 1506   ALT 18 04/01/2016 1000   ALT 19 09/01/2014 1506   ALKPHOS 57 04/01/2016 1000   ALKPHOS 65 09/01/2014 1506   BILITOT 0.5 04/01/2016 1000   BILITOT 0.5 09/01/2014 1506   GFRNONAA 48 (L) 09/29/2016 0920   GFRNONAA 57 (L) 09/01/2014 1506   GFRAA 56 (L) 09/29/2016 0920  GFRAA >60 09/01/2014 1506    No results found for: SPEP, UPEP  Lab Results  Component Value Date   WBC 7.8 09/29/2016   NEUTROABS 4.9 09/29/2016   HGB 11.8 (L) 09/29/2016   HCT 35.3 09/29/2016   MCV 83.4 09/29/2016   PLT 274 09/29/2016      Chemistry      Component Value Date/Time   NA 134 (L) 09/29/2016 0920   NA 135 09/01/2014 1506   K 3.6 09/29/2016 0920   K 4.1 09/01/2014 1506   CL 99 (L) 09/29/2016 0920   CL 98 (L) 09/01/2014 1506   CO2 29 09/29/2016 0920   CO2 30 09/01/2014 1506   BUN 16 09/29/2016 0920   BUN 19 09/01/2014 1506   CREATININE 1.00 09/29/2016 0920   CREATININE 0.91 09/01/2014 1506      Component Value Date/Time   CALCIUM 9.2 09/29/2016 0920   CALCIUM 9.5 09/01/2014 1506   ALKPHOS 57 04/01/2016 1000   ALKPHOS 65 09/01/2014 1506   AST 26 04/01/2016 1000   AST 29 09/01/2014 1506   ALT 18 04/01/2016 1000   ALT 19 09/01/2014 1506   BILITOT 0.5 04/01/2016 1000   BILITOT 0.5 09/01/2014 1506       RADIOGRAPHIC STUDIES: I have personally reviewed the radiological images as listed and agreed with the findings in the report. No results found.   ASSESSMENT & PLAN:  Carcinoma of upper-inner quadrant of right breast in female, estrogen receptor positive ([place]) # Breast cancer- Stage I- ER/PR positive; Her 2 neu- Neg; on femara; ca+vit D. clinically NED.  # Hx of Anemia- Hb today- 11.8; on Iron once a day; check iron studies/ferritin today. If have intol- then IV Venofer [relcutant]  # Vertigo/dizzy-  "sinuses"/sneezing- recommend claritin.  Continue   # Follow up in 6 months/labs.    No orders of the defined types were placed in this encounter.  All questions were answered. The patient knows to call the clinic with any problems, questions or concerns.      Cammie Sickle, MD 04/03/2017 8:45 AM

## 2017-05-23 DIAGNOSIS — I7090 Unspecified atherosclerosis: Secondary | ICD-10-CM

## 2017-05-23 HISTORY — DX: Unspecified atherosclerosis: I70.90

## 2017-06-21 ENCOUNTER — Other Ambulatory Visit: Payer: Self-pay

## 2017-06-21 DIAGNOSIS — C50211 Malignant neoplasm of upper-inner quadrant of right female breast: Secondary | ICD-10-CM

## 2017-06-21 DIAGNOSIS — Z170 Estrogen receptor positive status [ER+]: Principal | ICD-10-CM

## 2017-06-26 ENCOUNTER — Encounter: Payer: Self-pay | Admitting: *Deleted

## 2017-07-24 ENCOUNTER — Other Ambulatory Visit: Payer: Self-pay

## 2017-07-24 ENCOUNTER — Ambulatory Visit
Admission: RE | Admit: 2017-07-24 | Discharge: 2017-07-24 | Disposition: A | Discharge status: Home / Self Care | Payer: Medicare Other | Source: Ambulatory Visit | Attending: Radiation Oncology | Admitting: Radiation Oncology

## 2017-07-24 ENCOUNTER — Encounter: Payer: Self-pay | Admitting: Radiation Oncology

## 2017-07-24 VITALS — BP 147/66 | HR 63 | Temp 97.2°F | Resp 20 | Wt 178.8 lb

## 2017-07-24 DIAGNOSIS — Z79811 Long term (current) use of aromatase inhibitors: Secondary | ICD-10-CM | POA: Insufficient documentation

## 2017-07-24 DIAGNOSIS — Z923 Personal history of irradiation: Secondary | ICD-10-CM | POA: Insufficient documentation

## 2017-07-24 DIAGNOSIS — Z170 Estrogen receptor positive status [ER+]: Secondary | ICD-10-CM | POA: Diagnosis not present

## 2017-07-24 DIAGNOSIS — C50211 Malignant neoplasm of upper-inner quadrant of right female breast: Secondary | ICD-10-CM | POA: Insufficient documentation

## 2017-07-24 NOTE — Progress Notes (Signed)
Radiation Oncology Follow up Note  Name: Kristy Hardy   Date:   07/24/2017 MRN:  [id number] DOB: [birth year]    This 84 y.o. female presents to the clinic today for 2.5 year follow-up status post whole breast radiation to her right breast for stage I invasive mammary carcinoma ER/PR positive HER-2/neu negative.  REFERRING PROVIDER: [name], MD  HPI: Patient is an 84 year old female who actually I treated approximately 22 years prior to his left breast now is 2.5 years out from whole breast radiation to her right breast for stage I invasive mammary carcinoma ER/PR positive HER-2/neu negative. Seen today in routine follow-up she is doing well. She specifically denies breast tenderness cough or bone pain. She is scheduled for mammograms in March I have reviewed her mammograms from last March which are BI-RADS 2 benign. She is currently on Femara tolerating that well without side effect.   COMPLICATIONS OF TREATMENT: none  FOLLOW UP COMPLIANCE: keeps appointments   PHYSICAL EXAM:  BP (!) 147/66   Pulse 63   Temp (!) 97.2 F (36.2 C)   Resp 20   Wt 178 lb 12.7 oz (81.1 kg)   BMI 30.69 kg/m  Lungs are clear to A&P cardiac examination essentially unremarkable with regular rate and rhythm. No dominant mass or nodularity is noted in either breast in 2 positions examined. Incision is well-healed. No axillary or supraclavicular adenopathy is appreciated. Cosmetic result is excellent. Well-developed well-nourished patient in NAD. HEENT reveals PERLA, EOMI, discs not visualized.  Oral cavity is clear. No oral mucosal lesions are identified. Neck is clear without evidence of cervical or supraclavicular adenopathy. Lungs are clear to A&P. Cardiac examination is essentially unremarkable with regular rate and rhythm without murmur rub or thrill. Abdomen is benign with no organomegaly or masses noted. Motor sensory and DTR levels are equal and symmetric in the upper and lower extremities.  Cranial nerves II through XII are grossly intact. Proprioception is intact. No peripheral adenopathy or edema is identified. No motor or sensory levels are noted. Crude visual fields are within normal range.  RADIOLOGY RESULTS: Mammograms reviewed and compatible with the above-stated findings  PLAN: Present time she continues to do well with no evidence of disease. All see her one more time in a year for follow-up. She will be 84 years old at that time. She continues to do well with no evidence of disease. I'm please were overall progress. She is a rescheduled for follow-up mammograms this month as well as continues on Femara. Patient knows to call with any concerns.  I would like to take this opportunity to thank you for allowing me to participate in the care of your patient.Noreene Filbert, MD

## 2017-08-09 ENCOUNTER — Other Ambulatory Visit: Payer: Medicare Other

## 2017-08-16 ENCOUNTER — Emergency Department: Payer: Medicare Other

## 2017-08-16 ENCOUNTER — Other Ambulatory Visit: Payer: Self-pay

## 2017-08-16 ENCOUNTER — Inpatient Hospital Stay
Admission: EM | Admit: 2017-08-16 | Discharge: 2017-08-19 | DRG: 516 | Disposition: A | Discharge status: Skilled Nursing Facility | Payer: Medicare Other | Attending: Internal Medicine | Admitting: Internal Medicine

## 2017-08-16 DIAGNOSIS — M4856XA Collapsed vertebra, not elsewhere classified, lumbar region, initial encounter for fracture: Principal | ICD-10-CM | POA: Diagnosis present

## 2017-08-16 DIAGNOSIS — Z7982 Long term (current) use of aspirin: Secondary | ICD-10-CM | POA: Diagnosis not present

## 2017-08-16 DIAGNOSIS — S32010K Wedge compression fracture of first lumbar vertebra, subsequent encounter for fracture with nonunion: Secondary | ICD-10-CM

## 2017-08-16 DIAGNOSIS — R262 Difficulty in walking, not elsewhere classified: Secondary | ICD-10-CM

## 2017-08-16 DIAGNOSIS — M1990 Unspecified osteoarthritis, unspecified site: Secondary | ICD-10-CM | POA: Diagnosis present

## 2017-08-16 DIAGNOSIS — R52 Pain, unspecified: Secondary | ICD-10-CM

## 2017-08-16 DIAGNOSIS — Z923 Personal history of irradiation: Secondary | ICD-10-CM

## 2017-08-16 DIAGNOSIS — I248 Other forms of acute ischemic heart disease: Secondary | ICD-10-CM | POA: Diagnosis present

## 2017-08-16 DIAGNOSIS — Z79899 Other long term (current) drug therapy: Secondary | ICD-10-CM | POA: Diagnosis not present

## 2017-08-16 DIAGNOSIS — S32000A Wedge compression fracture of unspecified lumbar vertebra, initial encounter for closed fracture: Secondary | ICD-10-CM | POA: Diagnosis present

## 2017-08-16 DIAGNOSIS — R7989 Other specified abnormal findings of blood chemistry: Secondary | ICD-10-CM

## 2017-08-16 DIAGNOSIS — Z8701 Personal history of pneumonia (recurrent): Secondary | ICD-10-CM

## 2017-08-16 DIAGNOSIS — Z9049 Acquired absence of other specified parts of digestive tract: Secondary | ICD-10-CM

## 2017-08-16 DIAGNOSIS — Z803 Family history of malignant neoplasm of breast: Secondary | ICD-10-CM | POA: Diagnosis not present

## 2017-08-16 DIAGNOSIS — Z882 Allergy status to sulfonamides status: Secondary | ICD-10-CM | POA: Diagnosis not present

## 2017-08-16 DIAGNOSIS — R778 Other specified abnormalities of plasma proteins: Secondary | ICD-10-CM

## 2017-08-16 DIAGNOSIS — Z800 Family history of malignant neoplasm of digestive organs: Secondary | ICD-10-CM

## 2017-08-16 DIAGNOSIS — Z888 Allergy status to other drugs, medicaments and biological substances status: Secondary | ICD-10-CM

## 2017-08-16 DIAGNOSIS — M4805 Spinal stenosis, thoracolumbar region: Secondary | ICD-10-CM | POA: Diagnosis present

## 2017-08-16 DIAGNOSIS — I10 Essential (primary) hypertension: Secondary | ICD-10-CM | POA: Diagnosis present

## 2017-08-16 DIAGNOSIS — E785 Hyperlipidemia, unspecified: Secondary | ICD-10-CM | POA: Diagnosis present

## 2017-08-16 DIAGNOSIS — E876 Hypokalemia: Secondary | ICD-10-CM | POA: Diagnosis present

## 2017-08-16 DIAGNOSIS — J449 Chronic obstructive pulmonary disease, unspecified: Secondary | ICD-10-CM | POA: Diagnosis present

## 2017-08-16 DIAGNOSIS — E7800 Pure hypercholesterolemia, unspecified: Secondary | ICD-10-CM | POA: Diagnosis present

## 2017-08-16 DIAGNOSIS — Z853 Personal history of malignant neoplasm of breast: Secondary | ICD-10-CM | POA: Diagnosis not present

## 2017-08-16 DIAGNOSIS — Z880 Allergy status to penicillin: Secondary | ICD-10-CM | POA: Diagnosis not present

## 2017-08-16 DIAGNOSIS — Z85038 Personal history of other malignant neoplasm of large intestine: Secondary | ICD-10-CM | POA: Diagnosis not present

## 2017-08-16 DIAGNOSIS — Z419 Encounter for procedure for purposes other than remedying health state, unspecified: Secondary | ICD-10-CM

## 2017-08-16 LAB — LIPASE, BLOOD: Lipase: 33 U/L (ref 11–51)

## 2017-08-16 LAB — URINALYSIS, ROUTINE W REFLEX MICROSCOPIC
Bilirubin Urine: NEGATIVE
GLUCOSE, UA: NEGATIVE mg/dL
Hgb urine dipstick: NEGATIVE
Ketones, ur: NEGATIVE mg/dL
LEUKOCYTES UA: NEGATIVE
NITRITE: NEGATIVE
Protein, ur: NEGATIVE mg/dL
SPECIFIC GRAVITY, URINE: 1.02 (ref 1.005–1.030)
pH: 6 (ref 5.0–8.0)

## 2017-08-16 LAB — CBC WITH DIFFERENTIAL/PLATELET
BASOS ABS: 0.1 10*3/uL (ref 0–0.1)
BASOS PCT: 1 %
EOS ABS: 0.2 10*3/uL (ref 0–0.7)
Eosinophils Relative: 2 %
HEMATOCRIT: 30.6 % — AB (ref 35.0–47.0)
HEMOGLOBIN: 10 g/dL — AB (ref 12.0–16.0)
Lymphocytes Relative: 13 %
Lymphs Abs: 1.4 10*3/uL (ref 1.0–3.6)
MCH: 26.7 pg (ref 26.0–34.0)
MCHC: 32.8 g/dL (ref 32.0–36.0)
MCV: 81.4 fL (ref 80.0–100.0)
Monocytes Absolute: 0.9 10*3/uL (ref 0.2–0.9)
Monocytes Relative: 9 %
NEUTROS ABS: 7.7 10*3/uL — AB (ref 1.4–6.5)
NEUTROS PCT: 75 %
Platelets: 417 10*3/uL (ref 150–440)
RBC: 3.76 MIL/uL — AB (ref 3.80–5.20)
RDW: 15.8 % — ABNORMAL HIGH (ref 11.5–14.5)
WBC: 10.3 10*3/uL (ref 3.6–11.0)

## 2017-08-16 LAB — COMPREHENSIVE METABOLIC PANEL
ALK PHOS: 76 U/L (ref 38–126)
ALT: 16 U/L (ref 14–54)
ANION GAP: 13 (ref 5–15)
AST: 22 U/L (ref 15–41)
Albumin: 3.9 g/dL (ref 3.5–5.0)
BILIRUBIN TOTAL: 0.6 mg/dL (ref 0.3–1.2)
BUN: 23 mg/dL — AB (ref 6–20)
CALCIUM: 9.5 mg/dL (ref 8.9–10.3)
CO2: 28 mmol/L (ref 22–32)
Chloride: 91 mmol/L — ABNORMAL LOW (ref 101–111)
Creatinine, Ser: 0.72 mg/dL (ref 0.44–1.00)
GFR calc Af Amer: >60 mL/min (ref >60)
GFR calc non Af Amer: >60 mL/min (ref >60)
GLUCOSE: 112 mg/dL — AB (ref 65–99)
POTASSIUM: 3.4 mmol/L — AB (ref 3.5–5.1)
SODIUM: 132 mmol/L — AB (ref 135–145)
TOTAL PROTEIN: 8.1 g/dL (ref 6.5–8.1)

## 2017-08-16 LAB — PROTIME-INR
INR: 0.98
PROTHROMBIN TIME: 12.9 s (ref 11.4–15.2)

## 2017-08-16 LAB — TROPONIN I: Troponin I: 0.05 ng/mL (ref <0.03)

## 2017-08-16 LAB — MAGNESIUM: MAGNESIUM: 1.9 mg/dL (ref 1.7–2.4)

## 2017-08-16 MED ORDER — ACETAMINOPHEN 325 MG PO TABS
650.0000 mg | ORAL_TABLET | Freq: Four times a day (QID) | ORAL | Status: DC | PRN
Start: 2017-08-16 — End: 2017-08-19

## 2017-08-16 MED ORDER — FLUTICASONE PROPIONATE 50 MCG/ACT NA SUSP
1.0000 | Freq: Every day | NASAL | Status: DC
  Administered 2017-08-19: 1 via NASAL
  Filled 2017-08-16: qty 16

## 2017-08-16 MED ORDER — ONDANSETRON HCL 4 MG/2ML IJ SOLN
4.0000 mg | Freq: Four times a day (QID) | INTRAMUSCULAR | Status: DC | PRN
  Administered 2017-08-17 – 2017-08-18 (×3): 4 mg via INTRAVENOUS
  Filled 2017-08-16 (×3): qty 2

## 2017-08-16 MED ORDER — LOSARTAN POTASSIUM 50 MG PO TABS
50.0000 mg | ORAL_TABLET | Freq: Every day | ORAL | Status: DC
  Administered 2017-08-17 – 2017-08-19 (×3): 50 mg via ORAL
  Filled 2017-08-16 (×3): qty 1

## 2017-08-16 MED ORDER — PANTOPRAZOLE SODIUM 40 MG PO TBEC
40.0000 mg | DELAYED_RELEASE_TABLET | Freq: Every day | ORAL | Status: DC
  Administered 2017-08-18 – 2017-08-19 (×2): 40 mg via ORAL
  Filled 2017-08-16 (×2): qty 1

## 2017-08-16 MED ORDER — DOCUSATE SODIUM 100 MG PO CAPS
100.0000 mg | ORAL_CAPSULE | Freq: Two times a day (BID) | ORAL | Status: DC
  Administered 2017-08-17 – 2017-08-19 (×5): 100 mg via ORAL
  Filled 2017-08-16 (×5): qty 1

## 2017-08-16 MED ORDER — ALBUTEROL SULFATE (2.5 MG/3ML) 0.083% IN NEBU: 2.5000 mg | INHALATION_SOLUTION | Freq: Four times a day (QID) | RESPIRATORY_TRACT | Status: DC | PRN

## 2017-08-16 MED ORDER — MAGNESIUM OXIDE 400 (241.3 MG) MG PO TABS
400.0000 mg | ORAL_TABLET | Freq: Every day | ORAL | Status: DC
  Administered 2017-08-18 – 2017-08-19 (×2): 400 mg via ORAL
  Filled 2017-08-16 (×2): qty 1

## 2017-08-16 MED ORDER — AMLODIPINE BESYLATE 5 MG PO TABS
5.0000 mg | ORAL_TABLET | Freq: Every day | ORAL | Status: DC
  Administered 2017-08-18 – 2017-08-19 (×2): 5 mg via ORAL
  Filled 2017-08-16 (×2): qty 1

## 2017-08-16 MED ORDER — SIMVASTATIN 20 MG PO TABS
20.0000 mg | ORAL_TABLET | Freq: Every day | ORAL | Status: DC
  Administered 2017-08-17 – 2017-08-18 (×2): 20 mg via ORAL
  Filled 2017-08-16 (×2): qty 1

## 2017-08-16 MED ORDER — MORPHINE SULFATE (PF) 4 MG/ML IV SOLN
INTRAVENOUS | Status: AC
  Administered 2017-08-16: 2 mg
  Filled 2017-08-16: qty 1

## 2017-08-16 MED ORDER — LOSARTAN POTASSIUM-HCTZ 50-12.5 MG PO TABS: 1.0000 | ORAL_TABLET | Freq: Every day | ORAL | Status: DC

## 2017-08-16 MED ORDER — ACETAMINOPHEN 650 MG RE SUPP: 650.0000 mg | Freq: Four times a day (QID) | RECTAL | Status: DC | PRN

## 2017-08-16 MED ORDER — MORPHINE SULFATE (PF) 2 MG/ML IV SOLN
INTRAVENOUS | Status: AC
  Filled 2017-08-16: qty 1

## 2017-08-16 MED ORDER — MORPHINE SULFATE (PF) 2 MG/ML IV SOLN
0.5000 mg | Freq: Once | INTRAVENOUS | Status: AC
Start: 2017-08-16 — End: 2017-08-16
  Administered 2017-08-16: 0.5 mg via INTRAVENOUS

## 2017-08-16 MED ORDER — SODIUM CHLORIDE 0.9 % IV SOLN
Freq: Once | INTRAVENOUS | Status: AC
  Administered 2017-08-16: 21:00:00 via INTRAVENOUS

## 2017-08-16 MED ORDER — TRAZODONE HCL 50 MG PO TABS
25.0000 mg | ORAL_TABLET | Freq: Every evening | ORAL | Status: DC | PRN
  Administered 2017-08-17: 25 mg via ORAL
  Filled 2017-08-16: qty 1

## 2017-08-16 MED ORDER — HYDROCHLOROTHIAZIDE 12.5 MG PO CAPS
12.5000 mg | ORAL_CAPSULE | Freq: Every day | ORAL | Status: DC
  Administered 2017-08-18 – 2017-08-19 (×2): 12.5 mg via ORAL
  Filled 2017-08-16 (×2): qty 1

## 2017-08-16 MED ORDER — LORAZEPAM 1 MG PO TABS
1.0000 mg | ORAL_TABLET | Freq: Every evening | ORAL | Status: DC | PRN
  Administered 2017-08-17 – 2017-08-18 (×2): 1 mg via ORAL
  Filled 2017-08-16 (×2): qty 1

## 2017-08-16 MED ORDER — SODIUM CHLORIDE 0.9 % IV SOLN
INTRAVENOUS | Status: DC
  Administered 2017-08-17 (×3): via INTRAVENOUS

## 2017-08-16 MED ORDER — GUAIFENESIN ER 600 MG PO TB12: 600.0000 mg | ORAL_TABLET | Freq: Two times a day (BID) | ORAL | Status: DC | PRN

## 2017-08-16 MED ORDER — METOPROLOL SUCCINATE ER 50 MG PO TB24
50.0000 mg | ORAL_TABLET | Freq: Every day | ORAL | Status: DC
  Administered 2017-08-17 – 2017-08-19 (×3): 50 mg via ORAL
  Filled 2017-08-16 (×3): qty 1

## 2017-08-16 MED ORDER — HYDROCODONE-ACETAMINOPHEN 5-325 MG PO TABS
1.0000 | ORAL_TABLET | ORAL | Status: DC | PRN
  Administered 2017-08-17 – 2017-08-18 (×3): 1 via ORAL
  Administered 2017-08-18: 2 via ORAL
  Administered 2017-08-18 (×2): 1 via ORAL
  Administered 2017-08-19: 2 via ORAL
  Filled 2017-08-16: qty 1
  Filled 2017-08-16 (×2): qty 2
  Filled 2017-08-16 (×6): qty 1

## 2017-08-16 MED ORDER — MORPHINE SULFATE (PF) 2 MG/ML IV SOLN
0.5000 mg | Freq: Once | INTRAVENOUS | Status: DC
  Filled 2017-08-16: qty 1

## 2017-08-16 MED ORDER — ONDANSETRON HCL 4 MG PO TABS: 4.0000 mg | ORAL_TABLET | Freq: Four times a day (QID) | ORAL | Status: DC | PRN

## 2017-08-16 MED ORDER — MORPHINE SULFATE (PF) 2 MG/ML IV SOLN
0.5000 mg | Freq: Once | INTRAVENOUS | Status: AC
  Administered 2017-08-16: 0.5 mg via INTRAVENOUS
  Filled 2017-08-16: qty 1

## 2017-08-16 MED ORDER — FERROUS GLUCONATE 324 (38 FE) MG PO TABS
324.0000 mg | ORAL_TABLET | Freq: Every day | ORAL | Status: DC
  Administered 2017-08-18 – 2017-08-19 (×2): 324 mg via ORAL
  Filled 2017-08-16 (×3): qty 1

## 2017-08-16 MED ORDER — VITAMIN B-12 1000 MCG PO TABS
1000.0000 ug | ORAL_TABLET | Freq: Every day | ORAL | Status: DC
  Administered 2017-08-18 – 2017-08-19 (×2): 1000 ug via ORAL
  Filled 2017-08-16 (×2): qty 1

## 2017-08-16 MED ORDER — LETROZOLE 2.5 MG PO TABS
2.5000 mg | ORAL_TABLET | Freq: Every day | ORAL | Status: DC
  Administered 2017-08-18 – 2017-08-19 (×2): 2.5 mg via ORAL
  Filled 2017-08-16 (×3): qty 1

## 2017-08-16 MED ORDER — BISACODYL 5 MG PO TBEC
5.0000 mg | DELAYED_RELEASE_TABLET | Freq: Every day | ORAL | Status: DC | PRN
Start: 2017-08-16 — End: 2017-08-19

## 2017-08-16 NOTE — ED Notes (Signed)
Patient declined additional pain medication at this time.

## 2017-08-16 NOTE — ED Notes (Signed)
Pt returned from MRI °

## 2017-08-16 NOTE — ED Notes (Signed)
PT to MRI

## 2017-08-16 NOTE — ED Notes (Signed)
Patient transported to room 147 by this EDT.

## 2017-08-16 NOTE — ED Provider Notes (Signed)
[facility] Emergency Department Provider Note  ____________________________________________   First MD Initiated Contact with Patient 08/16/17 1551     (approximate)  I have reviewed the triage vital signs and the nursing notes.   HISTORY  Chief Complaint Fall and Back Pain    HPI Kristy Hardy is a 84 y.o. female with medical history as listed below who of note it has also been a year at [facility] for the last 22 years.  She presents from the office of Dr. Rudene Christians for further evaluation of gradually worsening lower back pain in the setting of known compression fracture to her L1 vertebra.  She was admitted to [facility] about 10 days ago for this and was discharged to rehab.  She left rehab today to come see Dr. Rudene Christians.  He feels she needs kyphoplasty but is unable to obtain a stat MRI that would be beneficial for him planning the surgery to make sure she does not have any acute worsening of her condition or acute spinal cord impingement.  She is frail at age 69 and has been gradually worsening over time and her pain is now severe.  She requires constant assistance with ambulation and cannot get up and walk by herself at all.  Her discomfort is mild at rest but severe with ambulation.  She denies fever/chills, chest pain, shortness of breath, nausea, vomiting, and abdominal pain.  She has no numbness nor tingling in her lower extremities.  She has not been having urinary retention.  Past Medical History:  Diagnosis Date  . Arthritis   . Breast cancer ([place])   . Cancer ([place]) 11-11-91   left breast   . Cancer ([place]) 08-12-14   right breast, ER/PR positive, Her2 negative  . Carcinoma of left breast ([place])    Left breast carcinoma; patient had lumpectomy radiation therapy   . COPD (chronic obstructive pulmonary disease) ([place])   . Heart valve problem    leaking  . History of colonoscopy 2012  . History of mammogram 2016  . Pneumonia July 2014    Patient  Active Problem List   Diagnosis Date Noted  . Carcinoma of upper-inner quadrant of right breast in female, estrogen receptor positive ([place]) 04/01/2016  . History of breast cancer 08/10/2015  . Iron deficiency anemia 02/03/2015  . Carcinoma of right breast ([place]) 10/12/2014  . Malignant neoplasm of hepatic flexure of colon ([place]) 09/29/2014  . Aortic heart valve narrowing 03/31/2014  . Benign neoplasm of colon 08/28/2013  . HLD (hyperlipidemia) 08/28/2013  . Hypercholesterolemia without hypertriglyceridemia 08/28/2013    Past Surgical History:  Procedure Laterality Date  . BREAST BIOPSY Bilateral [id number]   Positive  . BREAST SURGERY Left 11-11-91   lumpectomy with radiation, Tamoxifen for 2 years.Dr. Sharlet Salina  . BREAST SURGERY Right 09/09/14   RIGHT BREAST WIDE EXCISION   . CHOLECYSTECTOMY  02/23/1972  . COLONOSCOPY      Prior to Admission medications   Medication Sig Start Date End Date Taking? Authorizing Provider  acetaminophen (TYLENOL) 650 MG CR tablet Take by mouth as needed.     [provider]  amLODipine (NORVASC) 5 MG tablet Take 5 mg by mouth daily.  07/31/14   [provider]  aspirin 81 MG tablet Take 81 mg by mouth daily.    [provider]  Cyanocobalamin (RA VITAMIN B-12 TR) 1000 MCG TBCR Take 1 tablet by mouth daily.    [provider]  Ferrous Gluconate 325 (36 FE) MG  TABS Take 1 tablet by mouth 2 (two) times daily. Patient taking differently: Take 1 tablet by mouth daily.  02/03/15   Forest Gleason, MD  fluticasone (FLONASE) 50 MCG/ACT nasal spray Place 1 spray into both nostrils daily. As needed for seasonal allergies 05/30/14   [provider]  guaiFENesin (MUCINEX) 600 MG 12 hr tablet Take by mouth 2 (two) times daily as needed.    [provider]  letrozole (FEMARA) 2.5 MG tablet TAKE 1 TABLET(2.5 MG) BY MOUTH DAILY 10/31/16   Cammie Sickle, MD  LORazepam (ATIVAN) 1 MG tablet Take 1 tablet (1 mg total) by  mouth at bedtime as needed for anxiety. 09/29/14   Forest Gleason, MD  losartan-hydrochlorothiazide (HYZAAR) 50-12.5 MG per tablet Take 1 tablet by mouth daily.  05/21/14   [provider]  Magnesium 250 MG TABS Take 1 tablet by mouth daily. As needed for indigestion    [provider]  metoprolol succinate (TOPROL-XL) 50 MG 24 hr tablet Take 50 mg by mouth daily.  05/21/14   [provider]  omeprazole (PRILOSEC) 40 MG capsule Take 40 mg by mouth daily.    [provider]  PROAIR HFA 108 (90 Base) MCG/ACT inhaler Inhale 1 puff into the lungs every 6 (six) hours as needed for wheezing or shortness of breath.  05/31/16   [provider]  simvastatin (ZOCOR) 20 MG tablet Take 20 mg by mouth daily.    [provider]    Allergies Amoxicillin-pot clavulanate; Other; and Sulfa antibiotics  Family History  Problem Relation Age of Onset  . Colon cancer Mother 18  . Colon cancer Brother 68  . Breast cancer Sister 39    Social History Social History   Tobacco Use  . Smoking status: Never Smoker  . Smokeless tobacco: Never Used  Substance Use Topics  . Alcohol use: No    Alcohol/week: 0.0 oz  . Drug use: No    Review of Systems Constitutional: No fever/chills.  Generalized weakness and fatigue. Eyes: No visual changes. ENT: No sore throat. Cardiovascular: Denies chest pain. Respiratory: Denies shortness of breath. Gastrointestinal: No abdominal pain.  No nausea, no vomiting.  No diarrhea.  No constipation. Genitourinary: Negative for dysuria. Musculoskeletal: Gradually worsening low back pain for about 10 days as per HPI.  Inability to ambulate without extensive assistance. Integumentary: Negative for rash. Neurological: Negative for headaches, focal weakness or numbness.   ____________________________________________   PHYSICAL EXAM:  VITAL SIGNS: ED Triage Vitals  Enc Vitals Group     BP 08/16/17 1441 (!) 137/49     Pulse  Rate 08/16/17 1441 (!) 50     Resp 08/16/17 1441 16     Temp 08/16/17 1441 98 F (36.7 C)     Temp Source 08/16/17 1441 Oral     SpO2 08/16/17 1441 96 %     Weight 08/16/17 1442 79.4 kg (175 lb)     Height 08/16/17 1442 1.651 m ('5\' 5"' )     Head Circumference --      Peak Flow --      Pain Score 08/16/17 1442 10     Pain Loc --      Pain Edu? --      Excl. in [place]? --     Constitutional: Alert and oriented.  Elderly and frail, appears to be uncomfortable and winces when she tries to move around Eyes: Conjunctivae are normal.  Head: Atraumatic. Nose: No congestion/rhinnorhea. Mouth/Throat: Mucous membranes are moist. Neck:  No stridor.  No meningeal signs.   Cardiovascular: Bradycardia, regular rhythm. Good peripheral circulation. Grossly normal heart sounds. Respiratory: Normal respiratory effort.  No retractions. Lungs CTAB. Gastrointestinal: Soft and nontender. No distention.  Musculoskeletal: Tenderness to palpation of the lower back without any palpable gross deformities.  No evidence of cellulitis or abscess. Neurologic:  Normal speech and language. No gross focal neurologic deficits are appreciated.  Unable to walk due to pain Skin:  Skin is warm, dry and intact. No rash noted. Psychiatric: Mood and affect are normal. Speech and behavior are normal.  ____________________________________________   LABS (all labs ordered are listed, but only abnormal results are displayed)  Labs Reviewed  CBC WITH DIFFERENTIAL/PLATELET - Abnormal; Notable for the following components:      Result Value   RBC 3.76 (*)    Hemoglobin 10.0 (*)    HCT 30.6 (*)    RDW 15.8 (*)    Neutro Abs 7.7 (*)    All other components within normal limits  COMPREHENSIVE METABOLIC PANEL - Abnormal; Notable for the following components:   Sodium 132 (*)    Potassium 3.4 (*)    Chloride 91 (*)    Glucose, Bld 112 (*)    BUN 23 (*)    All other components within normal limits  TROPONIN I - Abnormal;  Notable for the following components:   Troponin I 0.05 (*)    All other components within normal limits  URINALYSIS, ROUTINE W REFLEX MICROSCOPIC - Abnormal; Notable for the following components:   Color, Urine YELLOW (*)    APPearance CLEAR (*)    All other components within normal limits  LIPASE, BLOOD  PROTIME-INR  MAGNESIUM   ____________________________________________  EKG  ED ECG REPORT I, Hinda Kehr, the attending physician, personally viewed and interpreted this ECG.  Date: 08/16/2017 EKG Time: 16: 16 Rate: 56 Rhythm: Sinus bradycardia QRS Axis: normal Intervals: QRS widening with right bundle branch block and left ventricular hypertrophy ST/T Wave abnormalities: Non-specific ST segment / T-wave changes, but no evidence of acute ischemia. Narrative Interpretation: no evidence of acute ischemia.  There is artifact present on the EKG which makes the interpretation more challenging.  In comparison to prior EKG, the morphology is similar with equivalently wide QRS, RBBB, and LVH   ____________________________________________  RADIOLOGY   ED MD interpretation: Severe comminuted fracture of L1 vertebra  Official radiology report(s): Mr Lumbar Spine Wo Contrast  Result Date: 08/16/2017 CLINICAL DATA:  84 year old female with traumatic L1 spine fracture EXAM: MRI LUMBAR SPINE WITHOUT CONTRAST TECHNIQUE: Multiplanar, multisequence MR imaging of the lumbar spine was performed. No intravenous contrast was administered. COMPARISON:  [facility] hospital lumbar spine radiographs 08/07/2017 and earlier. Lumbar MRI 02/23/2012. FINDINGS: Segmentation:  Normal. Alignment: Lumbar vertebral height and alignment from L2 to the sacrum is stable since 2013, including mild chronic retrolisthesis of L2-L3 through L4-L5. T12 and L1 are described below. Vertebrae: Comminuted fracture of the L1 vertebral body with internal T2 and STIR hyperintense hemorrhage. Superimposed trace internal gas  phenomena such as with avascular necrosis (series 3, image 9). 30-40% loss of vertebral body height. Prominent comminution of the central portion of the body with retropulsed of bone resulting in L1 level AP spinal canal narrowing by 33% compared to 2013. There is also increased multifactorial spinal stenosis at T12-L1 and L1-L2, detailed below. Both L1 pedicles appear to be fractured. The remaining L1 posterior elements appear intact. Chronic previously augmented T12 compression fracture with moderate to severe loss of height  and mild focal kyphosis. The T11 level is intact. The L2 through L5 lumbar levels and visible sacrum are intact. Conus medullaris and cauda equina: Conus extends to the L1-L2 level. No signal abnormality in the visible lower thoracic spinal cord or conus despite the multilevel spinal stenosis stated below. Paraspinal and other soft tissues: Mild medial psoas muscle and paraspinal soft tissue edema tracking inferiorly from the L1 level greater on the left (series 4, image 16). No discrete intramuscular fluid collection. Underlying chronic lumbar erector spinae muscle atrophy greater on the right. Stable and negative visible abdominal viscera. Disc levels: T12-L1: Mild spinal stenosis related to disc osteophyte complex, mild retropulsion of the posterior inferior T12 endplate and posterior element hypertrophy. No spinal cord mass effect. L1-L2: Moderate spinal stenosis is increased since 2013 related to circumferential disc bulge, superimposed right paracentral disc protrusion, and posterior element hypertrophy. There is mild mass effect on the conus. There is mild bilateral L1 neural foraminal stenosis. L2-L3: Chronic mildly increased multifactorial spinal stenosis related to bulky right eccentric circumferential disc protrusion and up to moderate posterior element hypertrophy. Chronic moderate to severe left lateral recess stenosis (descending left L3 nerve level). Only mild L2 foraminal  stenosis. L3-L4: Chronic mild spinal stenosis and moderate to severe left lateral recess stenosis related to left eccentric bulky circumferential disc bulge with endplate spurring and mild to moderate posterior element hypertrophy. Moderate to severe left L3 neural foraminal stenosis appears stable. L4-L5: Chronic bulky right eccentric circumferential disc bulge or protrusion with severe chronic right lateral recess stenosis at the right L5 nerve level. No significant spinal stenosis. Mild to moderate left and severe right L4 neural foraminal stenosis appears stable. L5-S1: Mild chronic anterolisthesis. Chronic severe facet hypertrophy has progressed along with degenerative facet joint fluid. Mild right eccentric circumferential disc bulge. Mild right lateral recess and right L5 neural foraminal stenosis have not significantly changed. IMPRESSION: 1. Acute to subacute L1 vertebral body compression fracture is highly comminuted, demonstrates internal hemorrhage, trace gas which might indicate AVN, bilateral pedicle fractures, and some bony retropulsion resulting in moderate spinal stenosis. 30-40% loss of vertebral body height. 2. Multifactorial spinal stenosis also at the adjacent T12-L1 and L1-L2 disc spaces affects the lower thoracic spinal cord and conus with up to mild mass effect, but no associated spinal cord or conus signal abnormality. 3. Chronic previously augmented T12 compression fracture. 4. Chronic multifactorial lumbar spinal and lateral recess stenosis L2-L3 through L4-L5 is stable to mildly progressed since 2013. Electronically Signed   By: Genevie Ann M.D.   On: 08/16/2017 20:14    ____________________________________________   PROCEDURES  Critical Care performed: No   Procedure(s) performed:   Procedures   ____________________________________________   INITIAL IMPRESSION / ASSESSMENT AND PLAN / ED COURSE  As part of my medical decision making, I reviewed the following data within  the [facility] consult was requested and obtained from this/these consultant(s) Orthopedics (Dr. Rudene Christians), Notes from prior ED visits and discussed with admitting physician (Dr. Duane Boston).   Differential diagnosis includes worsening pain in the setting of known lumbar compression fracture, acute or subacute spinal compression, cauda equina syndrome, general failure to thrive.  Of note her pulse was 50 in triage so she could have a heart block or other cardiovascular issue.  I spoke with Dr. Rudene Christians by phone and he is concerned about her; he has known her for years and said that she "looks terrible" and he is concerned she may have another medical issue  that needs to be addressed prior to her kyphoplasty.  He said he would be able to operate on her tomorrow if she could be medically admitted and cleared, but right now she cannot walk or care for herself at home and her pain is thus far intractable.  I will evaluate her broadly and provide pain medication.  Dr. Rudene Christians also requested we get a stat MRI of her lumbar spine for further evaluation and characterization of her injury to allow for better operative management.  I have put in the order for the noncontrasted lumbar spine MRI.  I updated the patient and her daughter about the plan.  Clinical Course as of Aug 17 2046  Wed Aug 16, 2017  1720 Reassuring CBC, no leukocytosis, mild anemia (likely chronic)  CBC with Differential/Platelet(!) [CF]  9622 Doubt this represents ACS, but kidney function is normal.  Possible demand ischemia from debilitation and recent traumatic injury.  Will discuss with hospitalist.  Patient is still awaiting MRI.  Troponin I(!!): 0.05 [CF]  1739 No evidence of UTI  Urinalysis, Routine w reflex microscopic(!) [CF]  2007 Dr. Rudene Christians called because he assessed the MRI from home and is comfortable with the plan for kyphoplasty tomorrow.  I explained to him my plan to admit her for her pain, serial troponins, and then the fact  that she will need PT/OT evaluation and possible placement after her surgery.  He agrees with this plan and stated that as long as the troponin does not go up significantly he will be comfortable doing the procedure tomorrow regardless.  This should also help her with her pain and discomfort.  I will discussed the case with the hospitalist.  I have updated the patient.  I also started an infusion of IV fluids.   [CF]  2025 I spoke with Dr. Duane Boston by phone and we discussed the case in detail.  She agrees with the plan for admission.  I updated the patient's family as well.   [CF]  2047 Of note, she has received 3 rounds of IV morphine so severe pain when she moves around.   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    Hinda Kehr, MD 08/16/17 2048

## 2017-08-16 NOTE — H&P (Signed)
[facility] NAME: Kristy Hardy    MR#:  [id number]  DATE OF BIRTH:  1925-04-06  DATE OF ADMISSION:  08/16/2017  PRIMARY CARE PHYSICIAN: [name], MD   REQUESTING/REFERRING PHYSICIAN:   CHIEF COMPLAINT:   Chief Complaint  Patient presents with  . Fall  . Back Pain    HISTORY OF PRESENT ILLNESS: Kristy Hardy  is a 84 y.o. female with a known history of osteoarthritis, asthma, breast cancer status post lumpectomy in the past. Patient was brought to emergency room for severe lower back pain and inability to ambulate due to the pain.  No focal weakness in the lower extremities; no new urinary/bowel incontinence/retention.  No fever/chills.  No chest pain/shortness of breath. Patient was diagnosed with L1 compression fracture approximately 2 weeks ago.  She completed the rehab course, without much improvement, she is still in a lot of pain and cannot ambulate.  She has been following with Dr. Rudene Christians, Orthopedics and she was advised to present to the hospital for lumbar spine MRI and possible kyphoplasty procedure. Blood test done in emergency room are remarkable for WBC at 10.3, creatinine 0.72, hemoglobin 10 and troponin level slightly elevated at 0.05.  Repeat troponin level after 4 hours was stable, at 0.05.  UA is negative for UTI.  EKG, reviewed by myself, shows normal sinus rhythm, no acute ischemic changes. MRI of the lumbar spine, reviewed by myself, shows acute to subacute L1 vertebral body compression fracture, highly comminuted, with internal hemorrhage, trace gas which might indicate AVN, bilateral pedicle fractures, and some bony retropulsion resulting in moderate spinal stenosis.  There is 30-40% loss of vertebral body height. Patient is admitted for further evaluation and treatment.  PAST MEDICAL HISTORY:   Past Medical History:  Diagnosis Date  . Arthritis   . Breast cancer ([place])   . Cancer ([place]) 11-11-91    left breast   . Cancer ([place]) 08-12-14   right breast, ER/PR positive, Her2 negative  . Carcinoma of left breast ([place])    Left breast carcinoma; patient had lumpectomy radiation therapy   . COPD (chronic obstructive pulmonary disease) ([place])   . Heart valve problem    leaking  . History of colonoscopy 2012  . History of mammogram 2016  . Pneumonia July 2014    PAST SURGICAL HISTORY:  Past Surgical History:  Procedure Laterality Date  . BREAST BIOPSY Bilateral [id number]   Positive  . BREAST SURGERY Left 11-11-91   lumpectomy with radiation, Tamoxifen for 2 years.Dr. Sharlet Salina  . BREAST SURGERY Right 09/09/14   RIGHT BREAST WIDE EXCISION   . CHOLECYSTECTOMY  02/23/1972  . COLONOSCOPY      SOCIAL HISTORY:  Social History   Tobacco Use  . Smoking status: Never Smoker  . Smokeless tobacco: Never Used  Substance Use Topics  . Alcohol use: No    Alcohol/week: 0.0 oz    FAMILY HISTORY:  Family History  Problem Relation Age of Onset  . Colon cancer Mother 82  . Colon cancer Brother 32  . Breast cancer Sister 80    DRUG ALLERGIES:  Allergies  Allergen Reactions  . Amoxicillin-Pot Clavulanate Nausea And Vomiting and Other (See Comments)    Has patient had a PCN reaction causing immediate rash, facial/tongue/throat swelling, SOB or lightheadedness with hypotension: No Has patient had a PCN reaction causing severe rash involving mucus membranes or skin necrosis: No Has patient had a PCN reaction that required hospitalization: No  Has patient had a PCN reaction occurring within the last 10 years: Unknown If all of the above answers are "NO", then may proceed with Cephalosporin use.   . Other Nausea And Vomiting  . Sulfa Antibiotics     REVIEW OF SYSTEMS:   CONSTITUTIONAL: No fever, fatigue or weakness.  EYES: No blurred or double vision.  EARS, NOSE, AND THROAT: No tinnitus or ear pain.  RESPIRATORY: No cough, shortness of breath, wheezing or hemoptysis.  CARDIOVASCULAR: No  chest pain, orthopnea, edema.  GASTROINTESTINAL: No nausea, vomiting, diarrhea or abdominal pain.  GENITOURINARY: No dysuria, hematuria.  ENDOCRINE: No polyuria, nocturia,  HEMATOLOGY: No bleeding SKIN: No rash or lesion. MUSCULOSKELETAL: Positive for severe lower back pain. NEUROLOGIC: No focal weakness.  PSYCHIATRY: No anxiety or depression.   MEDICATIONS AT HOME:  Prior to Admission medications   Medication Sig Start Date End Date Taking? Authorizing Provider  acetaminophen (TYLENOL) 650 MG CR tablet Take by mouth as needed.     [provider]  amLODipine (NORVASC) 5 MG tablet Take 5 mg by mouth daily.  07/31/14   [provider]  aspirin 81 MG tablet Take 81 mg by mouth daily.    [provider]  Cyanocobalamin (RA VITAMIN B-12 TR) 1000 MCG TBCR Take 1 tablet by mouth daily.    [provider]  Ferrous Gluconate 325 (36 FE) MG TABS Take 1 tablet by mouth 2 (two) times daily. Patient taking differently: Take 1 tablet by mouth daily.  02/03/15   Forest Gleason, MD  fluticasone (FLONASE) 50 MCG/ACT nasal spray Place 1 spray into both nostrils daily. As needed for seasonal allergies 05/30/14   [provider]  guaiFENesin (MUCINEX) 600 MG 12 hr tablet Take by mouth 2 (two) times daily as needed.    [provider]  letrozole (FEMARA) 2.5 MG tablet TAKE 1 TABLET(2.5 MG) BY MOUTH DAILY 10/31/16   Cammie Sickle, MD  LORazepam (ATIVAN) 1 MG tablet Take 1 tablet (1 mg total) by mouth at bedtime as needed for anxiety. 09/29/14   Forest Gleason, MD  losartan-hydrochlorothiazide (HYZAAR) 50-12.5 MG per tablet Take 1 tablet by mouth daily.  05/21/14   [provider]  Magnesium 250 MG TABS Take 1 tablet by mouth daily. As needed for indigestion    [provider]  metoprolol succinate (TOPROL-XL) 50 MG 24 hr tablet Take 50 mg by mouth daily.  05/21/14   [provider]  omeprazole (PRILOSEC) 40 MG capsule Take 40 mg by  mouth daily.    [provider]  PROAIR HFA 108 (90 Base) MCG/ACT inhaler Inhale 1 puff into the lungs every 6 (six) hours as needed for wheezing or shortness of breath.  05/31/16   [provider]  simvastatin (ZOCOR) 20 MG tablet Take 20 mg by mouth daily.    [provider]      PHYSICAL EXAMINATION:   VITAL SIGNS: Blood pressure (!) 172/52, pulse 63, temperature 98 F (36.7 C), temperature source Oral, resp. rate 18, height '5\' 5"'$  (1.651 m), weight 79.4 kg (175 lb), SpO2 94 %.  GENERAL:  84 y.o.-year-old patient lying in the bed, in moderate distress, secondary to lower back pain.  EYES: Pupils equal, round, reactive to light and accommodation. No scleral icterus. Extraocular muscles intact.  HEENT: Head atraumatic, normocephalic. Oropharynx and nasopharynx clear.  NECK:  Supple, no jugular venous distention. No thyroid enlargement, no tenderness.  LUNGS: Normal breath sounds bilaterally, no wheezing, rales,rhonchi or crepitation. No use  of accessory muscles of respiration.  CARDIOVASCULAR: S1, S2 normal. No S3/S4.  ABDOMEN: Soft, nontender, nondistended. Bowel sounds present. No organomegaly or mass.  EXTREMITIES: No pedal edema, cyanosis, or clubbing.  MUSCULOSKELETAL: There is tenderness to palpation at the lower back without any palpable gross deformities.  No swelling or redness noted. NEUROLOGIC: No focal weakness.  Gait not checked, as patient is unable to ambulate due to pain.  PSYCHIATRIC: The patient is alert and oriented x 3.  SKIN: No obvious rash, lesion, or ulcer.   LABORATORY PANEL:   CBC Recent Labs  Lab 08/16/17 1644  WBC 10.3  HGB 10.0*  HCT 30.6*  PLT 417  MCV 81.4  MCH 26.7  MCHC 32.8  RDW 15.8*  LYMPHSABS 1.4  MONOABS 0.9  EOSABS 0.2  BASOSABS 0.1   ------------------------------------------------------------------------------------------------------------------  Chemistries  Recent Labs  Lab 08/16/17 1644  NA 132*  K  3.4*  CL 91*  CO2 28  GLUCOSE 112*  BUN 23*  CREATININE 0.72  CALCIUM 9.5  MG 1.9  AST 22  ALT 16  ALKPHOS 76  BILITOT 0.6   ------------------------------------------------------------------------------------------------------------------ estimated creatinine clearance is 48.7 mL/min (by C-G formula based on SCr of 0.72 mg/dL). ------------------------------------------------------------------------------------------------------------------ No results for input(s): TSH, T4TOTAL, T3FREE, THYROIDAB in the last 72 hours.  Invalid input(s): FREET3   Coagulation profile Recent Labs  Lab 08/16/17 1644  INR 0.98   ------------------------------------------------------------------------------------------------------------------- No results for input(s): DDIMER in the last 72 hours. -------------------------------------------------------------------------------------------------------------------  Cardiac Enzymes Recent Labs  Lab 08/16/17 1644  TROPONINI 0.05*   ------------------------------------------------------------------------------------------------------------------ Invalid input(s): POCBNP  ---------------------------------------------------------------------------------------------------------------  Urinalysis    Component Value Date/Time   COLORURINE YELLOW (A) 08/16/2017 1705   APPEARANCEUR CLEAR (A) 08/16/2017 1705   APPEARANCEUR Hazy 11/12/2012 2042   LABSPEC 1.020 08/16/2017 1705   LABSPEC 1.026 11/12/2012 2042   PHURINE 6.0 08/16/2017 1705   GLUCOSEU NEGATIVE 08/16/2017 1705   GLUCOSEU Negative 11/12/2012 2042   HGBUR NEGATIVE 08/16/2017 1705   BILIRUBINUR NEGATIVE 08/16/2017 1705   BILIRUBINUR Negative 11/12/2012 2042   KETONESUR NEGATIVE 08/16/2017 1705   PROTEINUR NEGATIVE 08/16/2017 1705   NITRITE NEGATIVE 08/16/2017 1705   LEUKOCYTESUR NEGATIVE 08/16/2017 1705   LEUKOCYTESUR 3+ 11/12/2012 2042     RADIOLOGY: Mr Lumbar Spine Wo  Contrast  Result Date: 08/16/2017 CLINICAL DATA:  84 year old female with traumatic L1 spine fracture EXAM: MRI LUMBAR SPINE WITHOUT CONTRAST TECHNIQUE: Multiplanar, multisequence MR imaging of the lumbar spine was performed. No intravenous contrast was administered. COMPARISON:  [facility] lumbar spine radiographs 08/07/2017 and earlier. Lumbar MRI 02/23/2012. FINDINGS: Segmentation:  Normal. Alignment: Lumbar vertebral height and alignment from L2 to the sacrum is stable since 2013, including mild chronic retrolisthesis of L2-L3 through L4-L5. T12 and L1 are described below. Vertebrae: Comminuted fracture of the L1 vertebral body with internal T2 and STIR hyperintense hemorrhage. Superimposed trace internal gas phenomena such as with avascular necrosis (series 3, image 9). 30-40% loss of vertebral body height. Prominent comminution of the central portion of the body with retropulsed of bone resulting in L1 level AP spinal canal narrowing by 33% compared to 2013. There is also increased multifactorial spinal stenosis at T12-L1 and L1-L2, detailed below. Both L1 pedicles appear to be fractured. The remaining L1 posterior elements appear intact. Chronic previously augmented T12 compression fracture with moderate to severe loss of height and mild focal kyphosis. The T11 level is intact. The L2 through L5 lumbar levels and visible sacrum are intact. Conus medullaris and cauda equina: Conus extends  to the L1-L2 level. No signal abnormality in the visible lower thoracic spinal cord or conus despite the multilevel spinal stenosis stated below. Paraspinal and other soft tissues: Mild medial psoas muscle and paraspinal soft tissue edema tracking inferiorly from the L1 level greater on the left (series 4, image 16). No discrete intramuscular fluid collection. Underlying chronic lumbar erector spinae muscle atrophy greater on the right. Stable and negative visible abdominal viscera. Disc levels: T12-L1: Mild spinal  stenosis related to disc osteophyte complex, mild retropulsion of the posterior inferior T12 endplate and posterior element hypertrophy. No spinal cord mass effect. L1-L2: Moderate spinal stenosis is increased since 2013 related to circumferential disc bulge, superimposed right paracentral disc protrusion, and posterior element hypertrophy. There is mild mass effect on the conus. There is mild bilateral L1 neural foraminal stenosis. L2-L3: Chronic mildly increased multifactorial spinal stenosis related to bulky right eccentric circumferential disc protrusion and up to moderate posterior element hypertrophy. Chronic moderate to severe left lateral recess stenosis (descending left L3 nerve level). Only mild L2 foraminal stenosis. L3-L4: Chronic mild spinal stenosis and moderate to severe left lateral recess stenosis related to left eccentric bulky circumferential disc bulge with endplate spurring and mild to moderate posterior element hypertrophy. Moderate to severe left L3 neural foraminal stenosis appears stable. L4-L5: Chronic bulky right eccentric circumferential disc bulge or protrusion with severe chronic right lateral recess stenosis at the right L5 nerve level. No significant spinal stenosis. Mild to moderate left and severe right L4 neural foraminal stenosis appears stable. L5-S1: Mild chronic anterolisthesis. Chronic severe facet hypertrophy has progressed along with degenerative facet joint fluid. Mild right eccentric circumferential disc bulge. Mild right lateral recess and right L5 neural foraminal stenosis have not significantly changed. IMPRESSION: 1. Acute to subacute L1 vertebral body compression fracture is highly comminuted, demonstrates internal hemorrhage, trace gas which might indicate AVN, bilateral pedicle fractures, and some bony retropulsion resulting in moderate spinal stenosis. 30-40% loss of vertebral body height. 2. Multifactorial spinal stenosis also at the adjacent T12-L1 and L1-L2 disc  spaces affects the lower thoracic spinal cord and conus with up to mild mass effect, but no associated spinal cord or conus signal abnormality. 3. Chronic previously augmented T12 compression fracture. 4. Chronic multifactorial lumbar spinal and lateral recess stenosis L2-L3 through L4-L5 is stable to mildly progressed since 2013. Electronically Signed   By: Genevie Ann M.D.   On: 08/16/2017 20:14    EKG: Orders placed or performed during the hospital encounter of 08/16/17  . ED EKG  . ED EKG    IMPRESSION AND PLAN:  1.  Acute/subacute L1 compression fracture.  We will keep n.p.o. for possible kyphoplasty in a.m.  Based on the review of her physical capacity, blood test results and EKG, patient is at low risk for postop cardiac complications. Continue pain control. 2.  Minimal elevation in troponin level, likely related to demand ischemia.  We will continue to monitor patient on telemetry and follow the troponin levels.  3.  Hypertension, we will restart home medications.  All the records are reviewed and case discussed with ED provider. Management plans discussed with the patient, family and they are in agreement.  CODE STATUS: FULL    TOTAL TIME TAKING CARE OF THIS PATIENT: 40 minutes.    Amelia Jo M.D on 08/16/2017 at 10:04 PM  Between 7am to 6pm - Pager - [phone]  After 6pm go to [email or web address] - password [email or web address] [facility]  [facility]  Office  [phone]  CC:  Primary care physician; [name], MD

## 2017-08-16 NOTE — ED Notes (Signed)
RN aware of assigned

## 2017-08-16 NOTE — ED Notes (Signed)
Report called to admission RN. States pt is going to be placed in a different room then was initially assigned. Room currently dirty. RN to call when room is clean. Charge aware.

## 2017-08-16 NOTE — ED Notes (Signed)
Lab called to draw blood

## 2017-08-16 NOTE — ED Notes (Signed)
First Nurse Note:  Patient presents to the ED from Dr. Rudene Christians office.  Per family patient was seen by Dr. Rudene Christians and told she had a broken vertebrae and she would need an MRI and to be admitted for a procedure where he would "cement" her back tomorrow.

## 2017-08-16 NOTE — ED Triage Notes (Signed)
Pt was at [facility] 08/07/2017 and dx with "broken vertebrae L1" - pt went to PCP (Dr Angelique Blonder) today for follow up and they advised her to come to the ED for admission/MRI and procedure to be done tomorrow "cementing"

## 2017-08-17 ENCOUNTER — Encounter
Admission: EM | Disposition: A | Discharge status: Skilled Nursing Facility | Payer: Self-pay | Source: Home / Self Care | Attending: Internal Medicine

## 2017-08-17 ENCOUNTER — Inpatient Hospital Stay: Payer: Medicare Other | Admitting: Anesthesiology

## 2017-08-17 ENCOUNTER — Ambulatory Visit: Payer: Medicare Other | Admitting: General Surgery

## 2017-08-17 ENCOUNTER — Other Ambulatory Visit: Payer: Self-pay

## 2017-08-17 ENCOUNTER — Encounter: Payer: Self-pay | Admitting: Anesthesiology

## 2017-08-17 ENCOUNTER — Inpatient Hospital Stay: Payer: Medicare Other

## 2017-08-17 HISTORY — PX: KYPHOPLASTY: SHX5884

## 2017-08-17 LAB — CBC
HEMATOCRIT: 28.8 % — AB (ref 35.0–47.0)
HEMOGLOBIN: 9.5 g/dL — AB (ref 12.0–16.0)
MCH: 26.9 pg (ref 26.0–34.0)
MCHC: 33 g/dL (ref 32.0–36.0)
MCV: 81.6 fL (ref 80.0–100.0)
Platelets: 344 10*3/uL (ref 150–440)
RBC: 3.53 MIL/uL — AB (ref 3.80–5.20)
RDW: 15.5 % — ABNORMAL HIGH (ref 11.5–14.5)
WBC: 9.2 10*3/uL (ref 3.6–11.0)

## 2017-08-17 LAB — BASIC METABOLIC PANEL
Anion gap: 9 (ref 5–15)
BUN: 21 mg/dL — ABNORMAL HIGH (ref 6–20)
CO2: 28 mmol/L (ref 22–32)
Calcium: 8.8 mg/dL — ABNORMAL LOW (ref 8.9–10.3)
Chloride: 96 mmol/L — ABNORMAL LOW (ref 101–111)
Creatinine, Ser: 0.79 mg/dL (ref 0.44–1.00)
GLUCOSE: 96 mg/dL (ref 65–99)
POTASSIUM: 3.1 mmol/L — AB (ref 3.5–5.1)
Sodium: 133 mmol/L — ABNORMAL LOW (ref 135–145)

## 2017-08-17 LAB — GLUCOSE, CAPILLARY: GLUCOSE-CAPILLARY: 98 mg/dL (ref 65–99)

## 2017-08-17 LAB — SURGICAL PCR SCREEN
MRSA, PCR: NEGATIVE
Staphylococcus aureus: NEGATIVE

## 2017-08-17 LAB — TROPONIN I
TROPONIN I: 0.05 ng/mL — AB (ref <0.03)
Troponin I: 0.05 ng/mL (ref <0.03)

## 2017-08-17 SURGERY — KYPHOPLASTY
Anesthesia: General

## 2017-08-17 MED ORDER — IOPAMIDOL (ISOVUE-M 200) INJECTION 41%
INTRAMUSCULAR | Status: DC | PRN
  Administered 2017-08-17: 40 mL

## 2017-08-17 MED ORDER — PROPOFOL 500 MG/50ML IV EMUL
INTRAVENOUS | Status: DC | PRN
  Administered 2017-08-17: 25 ug/kg/min via INTRAVENOUS

## 2017-08-17 MED ORDER — ENOXAPARIN SODIUM 40 MG/0.4ML ~~LOC~~ SOLN
40.0000 mg | SUBCUTANEOUS | Status: DC
  Administered 2017-08-18 – 2017-08-19 (×2): 40 mg via SUBCUTANEOUS
  Filled 2017-08-17 (×2): qty 0.4

## 2017-08-17 MED ORDER — ONDANSETRON HCL 4 MG/2ML IJ SOLN: 4.0000 mg | Freq: Once | INTRAMUSCULAR | Status: DC | PRN

## 2017-08-17 MED ORDER — POTASSIUM CHLORIDE CRYS ER 20 MEQ PO TBCR
40.0000 meq | EXTENDED_RELEASE_TABLET | Freq: Once | ORAL | Status: AC
  Administered 2017-08-17: 40 meq via ORAL
  Filled 2017-08-17: qty 2

## 2017-08-17 MED ORDER — ASPIRIN EC 81 MG PO TBEC
81.0000 mg | DELAYED_RELEASE_TABLET | Freq: Every day | ORAL | Status: DC
  Administered 2017-08-17 – 2017-08-19 (×3): 81 mg via ORAL
  Filled 2017-08-17 (×3): qty 1

## 2017-08-17 MED ORDER — HYDRALAZINE HCL 20 MG/ML IJ SOLN
INTRAMUSCULAR | Status: AC
  Administered 2017-08-17: 10 mg via INTRAVENOUS
  Filled 2017-08-17: qty 1

## 2017-08-17 MED ORDER — MORPHINE SULFATE (PF) 2 MG/ML IV SOLN
2.0000 mg | INTRAVENOUS | Status: DC | PRN
  Administered 2017-08-17 – 2017-08-18 (×4): 2 mg via INTRAVENOUS
  Filled 2017-08-17 (×4): qty 1

## 2017-08-17 MED ORDER — BISACODYL 10 MG RE SUPP
10.0000 mg | Freq: Every day | RECTAL | Status: DC | PRN
  Administered 2017-08-19: 10 mg via RECTAL
  Filled 2017-08-17: qty 1

## 2017-08-17 MED ORDER — LIDOCAINE 2% (20 MG/ML) 5 ML SYRINGE
INTRAMUSCULAR | Status: DC | PRN
  Administered 2017-08-17: 50 mg via INTRAVENOUS

## 2017-08-17 MED ORDER — LIDOCAINE HCL 1 % IJ SOLN
INTRAMUSCULAR | Status: DC | PRN
  Administered 2017-08-17: 28 mL

## 2017-08-17 MED ORDER — PROPOFOL 10 MG/ML IV BOLUS
INTRAVENOUS | Status: AC
  Filled 2017-08-17: qty 20

## 2017-08-17 MED ORDER — PHENYLEPHRINE HCL 10 MG/ML IJ SOLN
INTRAMUSCULAR | Status: DC | PRN
  Administered 2017-08-17: 100 ug via INTRAVENOUS

## 2017-08-17 MED ORDER — KETAMINE HCL 50 MG/ML IJ SOLN
INTRAMUSCULAR | Status: DC | PRN
  Administered 2017-08-17: 10 mg via INTRAMUSCULAR
  Administered 2017-08-17: 15 mg via INTRAVENOUS

## 2017-08-17 MED ORDER — HYDRALAZINE HCL 20 MG/ML IJ SOLN
10.0000 mg | Freq: Once | INTRAMUSCULAR | Status: AC
  Administered 2017-08-17: 10 mg via INTRAVENOUS

## 2017-08-17 MED ORDER — FENTANYL CITRATE (PF) 100 MCG/2ML IJ SOLN: 25.0000 ug | INTRAMUSCULAR | Status: DC | PRN

## 2017-08-17 MED ORDER — CLINDAMYCIN PHOSPHATE 600 MG/50ML IV SOLN
600.0000 mg | Freq: Once | INTRAVENOUS | Status: AC
  Administered 2017-08-17: 600 mg via INTRAVENOUS
  Filled 2017-08-17: qty 50

## 2017-08-17 MED ORDER — METOCLOPRAMIDE HCL 5 MG/ML IJ SOLN: 5.0000 mg | Freq: Three times a day (TID) | INTRAMUSCULAR | Status: DC | PRN

## 2017-08-17 MED ORDER — FENTANYL CITRATE (PF) 250 MCG/5ML IJ SOLN
INTRAMUSCULAR | Status: AC
  Filled 2017-08-17: qty 5

## 2017-08-17 MED ORDER — PROPOFOL 10 MG/ML IV BOLUS
INTRAVENOUS | Status: DC | PRN
  Administered 2017-08-17 (×2): 20 mg via INTRAVENOUS

## 2017-08-17 MED ORDER — METOCLOPRAMIDE HCL 10 MG PO TABS: 5.0000 mg | ORAL_TABLET | Freq: Three times a day (TID) | ORAL | Status: DC | PRN

## 2017-08-17 MED ORDER — MAGNESIUM CITRATE PO SOLN
1.0000 | Freq: Once | ORAL | Status: DC | PRN
  Filled 2017-08-17: qty 296

## 2017-08-17 MED ORDER — BUPIVACAINE-EPINEPHRINE (PF) 0.5% -1:200000 IJ SOLN
INTRAMUSCULAR | Status: DC | PRN
  Administered 2017-08-17: 20 mL via PERINEURAL

## 2017-08-17 MED ORDER — LIDOCAINE HCL (PF) 2 % IJ SOLN
INTRAMUSCULAR | Status: AC
  Filled 2017-08-17: qty 10

## 2017-08-17 MED ORDER — DOCUSATE SODIUM 100 MG PO CAPS: 100.0000 mg | ORAL_CAPSULE | Freq: Two times a day (BID) | ORAL | Status: DC

## 2017-08-17 SURGICAL SUPPLY — 16 items
CEMENT KYPHON CX01A KIT/MIXER (Cement) ×3 IMPLANT
DERMABOND ADVANCED (GAUZE/BANDAGES/DRESSINGS) ×2
DERMABOND ADVANCED .7 DNX12 (GAUZE/BANDAGES/DRESSINGS) ×1 IMPLANT
DEVICE BIOPSY BONE KYPHX (INSTRUMENTS) IMPLANT
DRAPE C-ARM XRAY 36X54 (DRAPES) ×3 IMPLANT
DURAPREP 26ML APPLICATOR (WOUND CARE) ×3 IMPLANT
GLOVE SURG SYN 9.0  PF PI (GLOVE) ×2
GLOVE SURG SYN 9.0 PF PI (GLOVE) ×1 IMPLANT
GOWN SRG 2XL LVL 4 RGLN SLV (GOWNS) ×1 IMPLANT
GOWN STRL NON-REIN 2XL LVL4 (GOWNS) ×2
GOWN STRL REUS W/ TWL LRG LVL3 (GOWN DISPOSABLE) ×1 IMPLANT
GOWN STRL REUS W/TWL LRG LVL3 (GOWN DISPOSABLE) ×2
PACK KYPHOPLASTY (MISCELLANEOUS) ×3 IMPLANT
STRAP SAFETY 5IN WIDE (MISCELLANEOUS) ×3 IMPLANT
TRAY KYPHOPAK 15/3 EXPRESS 1ST (MISCELLANEOUS) IMPLANT
TRAY KYPHOPAK 20/3 EXPRESS 1ST (MISCELLANEOUS) ×3 IMPLANT

## 2017-08-17 NOTE — Transfer of Care (Signed)
Immediate Anesthesia Transfer of Care Note  Patient: Kristy Hardy  Procedure(s) Performed: Anne Ng (N/A )  Patient Location: PACU  Anesthesia Type:General  Level of Consciousness: awake and alert   Airway & Oxygen Therapy: Patient Spontanous Breathing and Patient connected to nasal cannula oxygen  Post-op Assessment: Report given to RN and Post -op Vital signs reviewed and stable  Post vital signs: Reviewed  Last Vitals:  Vitals Value Taken Time  BP 147/63 08/17/2017  4:46 PM  Temp 36.8 C 08/17/2017  4:46 PM  Pulse 70 08/17/2017  4:48 PM  Resp 21 08/17/2017  4:48 PM  SpO2 100 % 08/17/2017  4:48 PM  Vitals shown include unvalidated device data.  Last Pain:  Vitals:   08/17/17 1146  TempSrc:   PainSc: 7          Complications: No apparent anesthesia complications

## 2017-08-17 NOTE — Op Note (Signed)
08/17/2017  5:00 PM  PATIENT:  Kristy Hardy  84 y.o. female  PRE-OPERATIVE DIAGNOSIS:  compression fracture L1 compression fracture  POST-OPERATIVE DIAGNOSIS:  compression fracture L1  PROCEDURE:  Procedure(s): KYPHOPLASTY-L1 (N/A)  SURGEON: Laurene Footman, MD  ASSISTANTS: none  ANESTHESIA:   local and MAC  EBL:  Total I/O In: 990 [I.V.:990] Out: -   BLOOD ADMINISTERED:none  DRAINS: none   LOCAL MEDICATIONS USED:  MARCAINE    and XYLOCAINE   SPECIMEN:  No Specimen  DISPOSITION OF SPECIMEN:  N/A  COUNTS:  YES  TOURNIQUET:  * No tourniquets in log *  IMPLANTS: bone cement   DICTATION: .Dragon Dictationpatient was brought to the operating room and after adequate anesthesia was obtained the patient was placed prone. Serum was brought in in good visualization of L1 was obtained in both AP and lateral projections showing moderate compression. Appropriate patient identification and timeout procedures were completed and 10 cc of 1% Xylocaine was infiltrated on the right side. The back was then prepped and draped in usual sterile fashion and repeat timeout procedure carried out. Spinal needle was used to get local down to the pedicle on the right side at L1. After allowing this to set with a 50-50 mix of 1% Xylocaine half percent Sensorcaine with epinephrine a small incision was made and a trocar advanced in extrapedicular fashion into the vertebral body. Care was taken to remain in line with the pedicle on the lateral view and outside the medial wall the pedicle on the AP view to the vertebral body was entered. Biopsy was not obtained. Drilling was carried out followed by placement of the balloon with inflation of 4.0 cc. Amount was mixed at this time and what was the appropriate consistency was a slowly inserted there  was no extravasation . 6 cc infiltrated with good fill. There is no other extravasation. The trocar was removed with permanent C-arm views  obtained followed by Dermabond for the skin and a Band-Aid    PLAN OF CARE: Discharge to home after PACU  PATIENT DISPOSITION:  PACU - hemodynamically stable.

## 2017-08-17 NOTE — Consult Note (Signed)
Patient has L1 compression fracture with severe pain.  MRI confirms that level. Plan L1 kyphoplasty today.

## 2017-08-17 NOTE — Progress Notes (Signed)
Pt returned to room 147 from PACU. Pt is A&Ox4, bandaid is intact to mid right back. Pt is neuro intact, pain is significantly improved from pre-op per pt. VSS. Family at bedside.  [place], Jerry Caras

## 2017-08-17 NOTE — Progress Notes (Signed)
[place] at [facility]                                                                                                                                                                                  Patient Demographics   Kristy Hardy, is a 84 y.o. female, DOB - [birth year], IHK:[id number]  Admit date - 08/16/2017   Admitting Physician Amelia Jo, MD  Outpatient Primary MD for the patient is [name], MD   LOS - 1  Subjective: Patient seen and evaluated today Has back pain  no complaints of any chest pain No shortness of breath  tolerating diet okay  Review of Systems:   CONSTITUTIONAL: No documented fever. No fatigue, weakness. No weight gain, no weight loss.  EYES: No blurry or double vision.  ENT: No tinnitus. No postnasal drip. No redness of the oropharynx.  RESPIRATORY: No cough, no wheeze, no hemoptysis. No dyspnea.  CARDIOVASCULAR: No chest pain. No orthopnea. No palpitations. No syncope.  GASTROINTESTINAL: No nausea, no vomiting or diarrhea. No abdominal pain. No melena or hematochezia.  GENITOURINARY: No dysuria or hematuria.  ENDOCRINE: No polyuria or nocturia. No heat or cold intolerance.  HEMATOLOGY: No anemia. No bruising. No bleeding.  INTEGUMENTARY: No rashes. No lesions.  MUSCULOSKELETAL:. No swelling. No gout.  Has low back pain NEUROLOGIC: No numbness, tingling, or ataxia. No seizure-type activity.  PSYCHIATRIC: No anxiety. No insomnia. No ADD.    Vitals:   Vitals:   08/16/17 2322 08/17/17 0500 08/17/17 0755 08/17/17 0810  BP: (!) 164/63  (!) 161/59   Pulse: 67  62 68  Resp: 18     Temp: 97.7 F (36.5 C)  97.9 F (36.6 C)   TempSrc: Oral  Oral   SpO2: 97%  93%   Weight: 74.6 kg (164 lb 7.4 oz) 74.6 kg (164 lb 7.4 oz)    Height: 5\' 5"  (1.651 m)       Wt Readings from Last 3 Encounters:  08/17/17 74.6 kg (164 lb 7.4 oz)  07/24/17 81.1 kg (178 lb 12.7 oz)  09/29/16 80.7 kg (178 lb)     Intake/Output Summary  (Last 24 hours) at 08/17/2017 1350 Last data filed at 08/17/2017 1100 Gross per 24 hour  Intake 790 ml  Output -  Net 790 ml    Physical Exam:   GENERAL: Pleasant-appearing in no apparent distress.  HEAD, EYES, EARS, NOSE AND THROAT: Atraumatic, normocephalic. Extraocular muscles are intact. Pupils equal and reactive to light. Sclerae anicteric. No conjunctival injection. No oro-pharyngeal erythema.  NECK: Supple. There is no jugular venous distention. No bruits, no lymphadenopathy, no thyromegaly.  HEART: Regular rate and rhythm,. No murmurs,  no rubs, no clicks.  LUNGS: Clear to auscultation bilaterally. No rales or rhonchi. No wheezes.  ABDOMEN: Soft, flat, nontender, nondistended. Has good bowel sounds. No hepatosplenomegaly appreciated.  EXTREMITIES: No evidence of any cyanosis, clubbing, or peripheral edema.  +2 pedal and radial pulses bilaterally.  NEUROLOGIC: The patient is alert, awake, and oriented x3 with no focal motor or sensory deficits appreciated bilaterally.  Tenderness in Lower thoraco lumbar spine SKIN: Moist and warm with no rashes appreciated.  Psych: Not anxious, depressed LN: No inguinal LN enlargement    Antibiotics   Anti-infectives (From admission, onward)   Start     Dose/Rate Route Frequency Ordered Stop   08/17/17 1615  clindamycin (CLEOCIN) IVPB 600 mg     600 mg 100 mL/hr over 30 Minutes Intravenous  Once 08/17/17 0706        Medications   Scheduled Meds: . amLODipine  5 mg Oral Daily  . docusate sodium  100 mg Oral BID  . ferrous gluconate  324 mg Oral Daily  . fluticasone  1 spray Each Nare Daily  . losartan  50 mg Oral Daily   And  . hydrochlorothiazide  12.5 mg Oral Daily  . letrozole  2.5 mg Oral Daily  . magnesium oxide  400 mg Oral Daily  . metoprolol succinate  50 mg Oral Daily  .  morphine injection  0.5 mg Intravenous Once  . pantoprazole  40 mg Oral Daily  . potassium chloride  40 mEq Oral Once  . simvastatin  20 mg Oral Daily   . vitamin B-12  1,000 mcg Oral Daily   Continuous Infusions: . sodium chloride 75 mL/hr at 08/17/17 0811  . clindamycin (CLEOCIN) IV     PRN Meds:.acetaminophen **OR** acetaminophen, albuterol, bisacodyl, guaiFENesin, HYDROcodone-acetaminophen, LORazepam, morphine injection, ondansetron **OR** ondansetron (ZOFRAN) IV, traZODone   Data Review:   Micro Results Recent Results (from the past 240 hour(s))  Surgical pcr screen     Status: None   Collection Time: 08/17/17 12:18 AM  Result Value Ref Range Status   MRSA, PCR NEGATIVE NEGATIVE Final   Staphylococcus aureus NEGATIVE NEGATIVE Final    Comment: (NOTE) The Xpert SA Assay (FDA approved for NASAL specimens in patients 59 years of age and older), is one component of a comprehensive surveillance program. It is not intended to diagnose infection nor to guide or monitor treatment. Performed at [facility], [address] [place], [place] [address]     Radiology Reports Mr Lumbar Spine Wo Contrast  Result Date: 08/16/2017 CLINICAL DATA:  84 year old female with traumatic L1 spine fracture EXAM: MRI LUMBAR SPINE WITHOUT CONTRAST TECHNIQUE: Multiplanar, multisequence MR imaging of the lumbar spine was performed. No intravenous contrast was administered. COMPARISON:  [facility] lumbar spine radiographs 08/07/2017 and earlier. Lumbar MRI 02/23/2012. FINDINGS: Segmentation:  Normal. Alignment: Lumbar vertebral height and alignment from L2 to the sacrum is stable since 2013, including mild chronic retrolisthesis of L2-L3 through L4-L5. T12 and L1 are described below. Vertebrae: Comminuted fracture of the L1 vertebral body with internal T2 and STIR hyperintense hemorrhage. Superimposed trace internal gas phenomena such as with avascular necrosis (series 3, image 9). 30-40% loss of vertebral body height. Prominent comminution of the central portion of the body with retropulsed of bone resulting in L1 level AP spinal canal  narrowing by 33% compared to 2013. There is also increased multifactorial spinal stenosis at T12-L1 and L1-L2, detailed below. Both L1 pedicles appear to be fractured. The remaining L1 posterior elements  appear intact. Chronic previously augmented T12 compression fracture with moderate to severe loss of height and mild focal kyphosis. The T11 level is intact. The L2 through L5 lumbar levels and visible sacrum are intact. Conus medullaris and cauda equina: Conus extends to the L1-L2 level. No signal abnormality in the visible lower thoracic spinal cord or conus despite the multilevel spinal stenosis stated below. Paraspinal and other soft tissues: Mild medial psoas muscle and paraspinal soft tissue edema tracking inferiorly from the L1 level greater on the left (series 4, image 16). No discrete intramuscular fluid collection. Underlying chronic lumbar erector spinae muscle atrophy greater on the right. Stable and negative visible abdominal viscera. Disc levels: T12-L1: Mild spinal stenosis related to disc osteophyte complex, mild retropulsion of the posterior inferior T12 endplate and posterior element hypertrophy. No spinal cord mass effect. L1-L2: Moderate spinal stenosis is increased since 2013 related to circumferential disc bulge, superimposed right paracentral disc protrusion, and posterior element hypertrophy. There is mild mass effect on the conus. There is mild bilateral L1 neural foraminal stenosis. L2-L3: Chronic mildly increased multifactorial spinal stenosis related to bulky right eccentric circumferential disc protrusion and up to moderate posterior element hypertrophy. Chronic moderate to severe left lateral recess stenosis (descending left L3 nerve level). Only mild L2 foraminal stenosis. L3-L4: Chronic mild spinal stenosis and moderate to severe left lateral recess stenosis related to left eccentric bulky circumferential disc bulge with endplate spurring and mild to moderate posterior element  hypertrophy. Moderate to severe left L3 neural foraminal stenosis appears stable. L4-L5: Chronic bulky right eccentric circumferential disc bulge or protrusion with severe chronic right lateral recess stenosis at the right L5 nerve level. No significant spinal stenosis. Mild to moderate left and severe right L4 neural foraminal stenosis appears stable. L5-S1: Mild chronic anterolisthesis. Chronic severe facet hypertrophy has progressed along with degenerative facet joint fluid. Mild right eccentric circumferential disc bulge. Mild right lateral recess and right L5 neural foraminal stenosis have not significantly changed. IMPRESSION: 1. Acute to subacute L1 vertebral body compression fracture is highly comminuted, demonstrates internal hemorrhage, trace gas which might indicate AVN, bilateral pedicle fractures, and some bony retropulsion resulting in moderate spinal stenosis. 30-40% loss of vertebral body height. 2. Multifactorial spinal stenosis also at the adjacent T12-L1 and L1-L2 disc spaces affects the lower thoracic spinal cord and conus with up to mild mass effect, but no associated spinal cord or conus signal abnormality. 3. Chronic previously augmented T12 compression fracture. 4. Chronic multifactorial lumbar spinal and lateral recess stenosis L2-L3 through L4-L5 is stable to mildly progressed since 2013. Electronically Signed   By: Genevie Ann M.D.   On: 08/16/2017 20:14     CBC Recent Labs  Lab 08/16/17 1644 08/17/17 0445  WBC 10.3 9.2  HGB 10.0* 9.5*  HCT 30.6* 28.8*  PLT 417 344  MCV 81.4 81.6  MCH 26.7 26.9  MCHC 32.8 33.0  RDW 15.8* 15.5*  LYMPHSABS 1.4  --   MONOABS 0.9  --   EOSABS 0.2  --   BASOSABS 0.1  --     Chemistries  Recent Labs  Lab 08/16/17 1644 08/17/17 0445  NA 132* 133*  K 3.4* 3.1*  CL 91* 96*  CO2 28 28  GLUCOSE 112* 96  BUN 23* 21*  CREATININE 0.72 0.79  CALCIUM 9.5 8.8*  MG 1.9  --   AST 22  --   ALT 16  --   ALKPHOS 76  --   BILITOT 0.6  --     ------------------------------------------------------------------------------------------------------------------  estimated creatinine clearance is 47.2 mL/min (by C-G formula based on SCr of 0.79 mg/dL). ------------------------------------------------------------------------------------------------------------------ No results for input(s): HGBA1C in the last 72 hours. ------------------------------------------------------------------------------------------------------------------ No results for input(s): CHOL, HDL, LDLCALC, TRIG, CHOLHDL, LDLDIRECT in the last 72 hours. ------------------------------------------------------------------------------------------------------------------ No results for input(s): TSH, T4TOTAL, T3FREE, THYROIDAB in the last 72 hours.  Invalid input(s): FREET3 ------------------------------------------------------------------------------------------------------------------ No results for input(s): VITAMINB12, FOLATE, FERRITIN, TIBC, IRON, RETICCTPCT in the last 72 hours.  Coagulation profile Recent Labs  Lab 08/16/17 1644  INR 0.98    No results for input(s): DDIMER in the last 72 hours.  Cardiac Enzymes Recent Labs  Lab 08/16/17 1644 08/16/17 2351 08/17/17 0445  TROPONINI 0.05* 0.05* 0.05*   ------------------------------------------------------------------------------------------------------------------ Invalid input(s): POCBNP    Assessment & Plan   1. Vertebral compression fracture L1 and T12 Vertebral kyphoplasty by Dr. Rudene Christians today  2.  Spinal stenosis Orthopedic follow-up  3.  Elevated troponin secondary to demand ischemia  4.  Ambulatory dysfunction secondary to back pain Kyphoplasty recommended by orthopedics Physical therapy  5.  Hypokalemia Replace potassium orally  6.  History of breast cancer Oncology follow-up as outpatient  7.  Lower back pain IV morphine as needed for pain    Code Status Orders  (From  admission, onward)        Start     Ordered   08/16/17 2349  Full code  Continuous     08/16/17 2348    Code Status History    This patient has a current code status but no historical code status.     Time Spent in minutes   36 minutes  Greater than 50% of time spent in care coordination and counseling patient regarding the condition and plan of care.   Saundra Shelling M.D on 08/17/2017 at 1:50 PM  Between 7am to 6pm - Pager - [phone]  After 6pm go to [email or web address] - Proofreader  [employer]   Office  [phone]

## 2017-08-17 NOTE — Anesthesia Post-op Follow-up Note (Signed)
Anesthesia QCDR form completed.        

## 2017-08-17 NOTE — Progress Notes (Signed)
Hydralazine 10mg  given for blood pressure 176/59

## 2017-08-17 NOTE — Anesthesia Preprocedure Evaluation (Addendum)
Anesthesia Evaluation  Patient identified by MRN, date of birth, ID band Patient awake    Reviewed: Allergy & Precautions, H&P , NPO status , Patient's Chart, lab work & pertinent test results, reviewed documented beta blocker date and time   Airway Mallampati: II  TM Distance: >3 FB Neck ROM: full    Dental no notable dental hx. (+) Teeth Intact   Pulmonary neg pulmonary ROS, pneumonia, COPD,    Pulmonary exam normal breath sounds clear to auscultation       Cardiovascular Exercise Tolerance: Poor hypertension, negative cardio ROS   Rhythm:regular Rate:Normal     Neuro/Psych negative neurological ROS  negative psych ROS   GI/Hepatic negative GI ROS, Neg liver ROS,   Endo/Other  negative endocrine ROSdiabetes  Renal/GU      Musculoskeletal   Abdominal   Peds  Hematology negative hematology ROS (+) anemia ,   Anesthesia Other Findings   Reproductive/Obstetrics negative OB ROS                             Anesthesia Physical Anesthesia Plan  ASA: III  Anesthesia Plan: MAC   Post-op Pain Management:    Induction: Intravenous  PONV Risk Score and Plan:   Airway Management Planned: Natural Airway and Nasal Cannula  Additional Equipment:   Intra-op Plan:   Post-operative Plan:   Informed Consent: I have reviewed the patients History and Physical, chart, labs and discussed the procedure including the risks, benefits and alternatives for the proposed anesthesia with the patient or authorized representative who has indicated his/her understanding and acceptance.   Dental Advisory Given  Plan Discussed with: CRNA  Anesthesia Plan Comments: (Patient consented for risks of anesthesia including but not limited to:  - adverse reactions to medications - risk of intubation if required - damage to teeth, lips or other oral mucosa - sore throat or hoarseness - Damage to heart, brain,  lungs or loss of life  Patient voiced understanding.)       Anesthesia Quick Evaluation

## 2017-08-17 NOTE — Clinical Social Work Note (Addendum)
Clinical Social Work Assessment  Patient Details  Name: Kristy Hardy MRN: [id number] Date of Birth: 04/16/1925  Date of referral:  08/17/17               Reason for consult:  Facility Placement, Discharge Planning                Permission sought to share information with:    Permission granted to share information::     Name::      Skilled Nursing Facility  Agency::   [place]  Relationship::     Contact Information:     Housing/Transportation Living arrangements for the past 2 months:  Single Family Home Source of Information:  Adult Children Patient Interpreter Needed:  None Criminal Activity/Legal Involvement Pertinent to Current Situation/Hospitalization:  No - Comment as needed Significant Relationships:  Adult Children, Siblings Lives with:  Self Do you feel safe going back to the place where you live?  Yes Need for family participation in patient care:  Yes (Comment)  Care giving concerns: Patient lives alone in [place], [place].   Social Worker assessment / plan: Clinical Social Worker (CSW) reviewed patient's chart and noted that she is having a kypho today. PT is pending. Social work intern met with patient, daughter Kristy Hardy ([phone] or [phone]), son Kristy Hardy ([phone]), and sister Kristy Hardy at bedside. Patient was laying in bed crying because of the pain so daughter Kristy Hardy stepped outside of the room to participate in assessment. Social work intern introduced self and explained the role of the CSW department. Per Kristy Hardy, patient lives in [place] alone and her and patient's son are her primary contact. Per Kristy Hardy patient was previously at [facility] in [place], [place] for 4 days of rehab. Social work intern explained that surgery is pending, PT will recommend home with Home health or SNF placement, and that the patient already being in a nursing home for rehab she will not have her full rehab days left. Patient's daughter verbalized her  understanding and refused SNF. Patient's family prefers outpatient PT or home with Home Health. RN case manager aware of above. CSW and social work intern will continue to follow up and assist.  Employment status:  Retired Insurance information:  Medicare PT Recommendations:  Not assessed at this time Information / Referral to community resources:     Patient/Family's Response to care: Patient's family refused SNF and prefer outpatient PT or Home Health PT.  Patient/Family's Understanding of and Emotional Response to Diagnosis, Current Treatment, and Prognosis: Patient's family members were pleasant and thanked social work intern for her assistance.  Emotional Assessment Appearance:  Appears stated age Attitude/Demeanor/Rapport:  Crying, Complaining Affect (typically observed):    Orientation:  Oriented to Self, Oriented to Place, Oriented to Situation Alcohol / Substance use:  Not Applicable Psych involvement (Current and /or in the community):  No (Comment)  Discharge Needs  Concerns to be addressed:  Care Coordination, Discharge Planning Concerns Readmission within the last 30 days:  Yes Current discharge risk:  Dependent with Mobility Barriers to Discharge:  Continued Medical Work up   Ananda A Hodgson, Student-Social Work 08/17/2017, 11:21 AM  

## 2017-08-17 NOTE — NC FL2 (Signed)
[place] LEVEL OF CARE SCREENING TOOL     IDENTIFICATION  Patient Name: Kristy Hardy Birthdate: May 13, 1925 Sex: female Admission Date (Current Location): 08/16/2017  [place] and [place] Number:  Engineering geologist and Address:  [facility], [address] [place], [place] [address]      Provider Number: [id number]  Attending Physician Name and Address:  Saundra Shelling, MD  Relative Name and Phone Number:       Current Level of Care: Hospital Recommended Level of Care: [place] Prior Approval Number:    Date Approved/Denied:   PASRR Number: ([id number] )  Discharge Plan: SNF    Current Diagnoses: Patient Active Problem List   Diagnosis Date Noted  . Lumbar compression fracture ([place]) 08/16/2017  . Carcinoma of upper-inner quadrant of right breast in female, estrogen receptor positive ([place]) 04/01/2016  . History of breast cancer 08/10/2015  . Iron deficiency anemia 02/03/2015  . Carcinoma of right breast ([place]) 10/12/2014  . Malignant neoplasm of hepatic flexure of colon ([place]) 09/29/2014  . Aortic heart valve narrowing 03/31/2014  . Benign neoplasm of colon 08/28/2013  . HLD (hyperlipidemia) 08/28/2013  . Hypercholesterolemia without hypertriglyceridemia 08/28/2013    Orientation RESPIRATION BLADDER Height & Weight     Self, Situation, Place  Normal Incontinent Weight: 164 lb 7.4 oz (74.6 kg) Height:  5\' 5"  (165.1 cm)  BEHAVIORAL SYMPTOMS/MOOD NEUROLOGICAL BOWEL NUTRITION STATUS      Continent Diet(NPO to be advanced)  AMBULATORY STATUS COMMUNICATION OF NEEDS Skin   Extensive Assist Verbally Normal                       Personal Care Assistance Level of Assistance  Bathing, Feeding, Dressing Bathing Assistance: Limited assistance Feeding assistance: Independent Dressing Assistance: Limited assistance     Functional Limitations Info  Sight, Hearing, Speech Sight Info:  Adequate Hearing Info: Adequate Speech Info: Adequate    SPECIAL CARE FACTORS FREQUENCY  PT (By licensed PT), OT (By licensed OT)     PT Frequency: (5) OT Frequency: (5)            Contractures      Additional Factors Info  Code Status, Allergies Code Status Info: (Full Code) Allergies Info: (AMOXICILLIN-POT CLAVULANATE, OTHER, SULFA ANTIBIOTICS )           Current Medications (08/17/2017):  This is the current hospital active medication list Current Facility-Administered Medications  Medication Dose Route Frequency Provider Last Rate Last Dose  . 0.9 %  sodium chloride infusion   Intravenous Continuous Amelia Jo, MD 75 mL/hr at 08/17/17 [phone]    . acetaminophen (TYLENOL) tablet 650 mg  650 mg Oral Q6H PRN Amelia Jo, MD       Or  . acetaminophen (TYLENOL) suppository 650 mg  650 mg Rectal Q6H PRN Amelia Jo, MD      . albuterol (PROVENTIL) (2.5 MG/3ML) 0.083% nebulizer solution 2.5 mg  2.5 mg Inhalation Q6H PRN Amelia Jo, MD      . amLODipine (NORVASC) tablet 5 mg  5 mg Oral Daily Amelia Jo, MD      . bisacodyl (DULCOLAX) EC tablet 5 mg  5 mg Oral Daily PRN Amelia Jo, MD      . clindamycin (CLEOCIN) IVPB 600 mg  600 mg Intravenous Once Hessie Knows, MD      . docusate sodium (COLACE) capsule 100 mg  100 mg Oral BID Amelia Jo, MD   100 mg at  08/17/17 0028  . ferrous gluconate (FERGON) tablet 324 mg  324 mg Oral Daily Amelia Jo, MD      . fluticasone [facility]) 50 MCG/ACT nasal spray 1 spray  1 spray Each Nare Daily Amelia Jo, MD      . guaiFENesin (MUCINEX) 12 hr tablet 600 mg  600 mg Oral BID PRN Amelia Jo, MD      . losartan (COZAAR) tablet 50 mg  50 mg Oral Daily Amelia Jo, MD   50 mg at 08/17/17 0809   And  . hydrochlorothiazide (MICROZIDE) capsule 12.5 mg  12.5 mg Oral Daily Amelia Jo, MD      . HYDROcodone-acetaminophen (NORCO/VICODIN) 5-325 MG per tablet 1-2 tablet  1-2 tablet Oral Q4H PRN Amelia Jo, MD   1 tablet at  08/17/17 0024  . letrozole [facility]) tablet 2.5 mg  2.5 mg Oral Daily Amelia Jo, MD      . LORazepam (ATIVAN) tablet 1 mg  1 mg Oral QHS PRN Amelia Jo, MD   1 mg at 08/17/17 0024  . magnesium oxide (MAG-OX) tablet 400 mg  400 mg Oral Daily Amelia Jo, MD      . metoprolol succinate (TOPROL-XL) 24 hr tablet 50 mg  50 mg Oral Daily Amelia Jo, MD   50 mg at 08/17/17 0810  . morphine 2 MG/ML injection 0.5 mg  0.5 mg Intravenous Once Hinda Kehr, MD      . morphine 2 MG/ML injection 2 mg  2 mg Intravenous Q2H PRN Salary, Avel Peace, MD   2 mg at 08/17/17 0758  . ondansetron (ZOFRAN) tablet 4 mg  4 mg Oral Q6H PRN Amelia Jo, MD       Or  . ondansetron [facility]) injection 4 mg  4 mg Intravenous Q6H PRN Amelia Jo, MD      . pantoprazole (PROTONIX) EC tablet 40 mg  40 mg Oral Daily Amelia Jo, MD      . simvastatin (ZOCOR) tablet 20 mg  20 mg Oral Daily Amelia Jo, MD      . traZODone (DESYREL) tablet 25 mg  25 mg Oral QHS PRN Amelia Jo, MD      . vitamin B-12 (CYANOCOBALAMIN) tablet 1,000 mcg  1,000 mcg Oral Daily Amelia Jo, MD         Discharge Medications: Please see discharge summary for a list of discharge medications.  Relevant Imaging Results:  Relevant Lab Results:   Additional Information (SSN: [id number])  Smith Mince, Student-Social Work

## 2017-08-18 ENCOUNTER — Encounter: Payer: Self-pay | Admitting: Orthopedic Surgery

## 2017-08-18 LAB — CBC
HEMATOCRIT: 31.2 % — AB (ref 35.0–47.0)
HEMOGLOBIN: 10 g/dL — AB (ref 12.0–16.0)
MCH: 26.1 pg (ref 26.0–34.0)
MCHC: 31.9 g/dL — ABNORMAL LOW (ref 32.0–36.0)
MCV: 81.7 fL (ref 80.0–100.0)
Platelets: 352 10*3/uL (ref 150–440)
RBC: 3.82 MIL/uL (ref 3.80–5.20)
RDW: 15.5 % — ABNORMAL HIGH (ref 11.5–14.5)
WBC: 11.1 10*3/uL — ABNORMAL HIGH (ref 3.6–11.0)

## 2017-08-18 LAB — BASIC METABOLIC PANEL
ANION GAP: 10 (ref 5–15)
BUN: 16 mg/dL (ref 6–20)
CALCIUM: 8.9 mg/dL (ref 8.9–10.3)
CO2: 24 mmol/L (ref 22–32)
Chloride: 99 mmol/L — ABNORMAL LOW (ref 101–111)
Creatinine, Ser: 0.71 mg/dL (ref 0.44–1.00)
Glucose, Bld: 108 mg/dL — ABNORMAL HIGH (ref 65–99)
POTASSIUM: 3.8 mmol/L (ref 3.5–5.1)
Sodium: 133 mmol/L — ABNORMAL LOW (ref 135–145)

## 2017-08-18 LAB — GLUCOSE, CAPILLARY: GLUCOSE-CAPILLARY: 114 mg/dL — AB (ref 65–99)

## 2017-08-18 MED ORDER — METHOCARBAMOL 500 MG PO TABS
500.0000 mg | ORAL_TABLET | Freq: Four times a day (QID) | ORAL | Status: DC
  Administered 2017-08-18 – 2017-08-19 (×2): 500 mg via ORAL
  Filled 2017-08-18 (×2): qty 1

## 2017-08-18 NOTE — Progress Notes (Signed)
PT is recommending SNF. Clinical Education officer, museum (CSW) met with patient and her son Kristy Hardy and explained SNF process. They are agreeable to SNF search in [facility]. FL2 complete and faxed out.   CSW presented bed offers to patient and son Kristy Hardy. They chose [facility]. Plan is for patient to D/C to [place] room 511 over the weekend if medially stable. CSW sent D/C Summary to [facility] via [place] today. Patient's daughter Kristy Hardy is also aware of above.   [employer], LCSW [phone]

## 2017-08-18 NOTE — Progress Notes (Signed)
[facility] at [facility]                                                                                                                                                                                  Patient Demographics   Kristy Hardy, is a 84 y.o. female, DOB - 1924-12-23, WJX:[id number]  Admit date - 08/16/2017   Admitting Physician Amelia Jo, MD  Outpatient Primary MD for the patient is [name], MD   LOS - 2  Subjective: Patient seen and evaluated today Has decreased back pain  no complaints of any chest pain No shortness of breath  tolerating diet okay Tolerated kyphoplasty surgery well yesterday  Review of Systems:   CONSTITUTIONAL: No documented fever. No fatigue, weakness. No weight gain, no weight loss.  EYES: No blurry or double vision.  ENT: No tinnitus. No postnasal drip. No redness of the oropharynx.  RESPIRATORY: No cough, no wheeze, no hemoptysis. No dyspnea.  CARDIOVASCULAR: No chest pain. No orthopnea. No palpitations. No syncope.  GASTROINTESTINAL: No nausea, no vomiting or diarrhea. No abdominal pain. No melena or hematochezia.  GENITOURINARY: No dysuria or hematuria.  ENDOCRINE: No polyuria or nocturia. No heat or cold intolerance.  HEMATOLOGY: No anemia. No bruising. No bleeding.  INTEGUMENTARY: No rashes. No lesions.  MUSCULOSKELETAL:. No swelling. No gout.  Decreased low back pain NEUROLOGIC: No numbness, tingling, or ataxia. No seizure-type activity.  PSYCHIATRIC: No anxiety. No insomnia. No ADD.    Vitals:   Vitals:   08/17/17 2309 08/18/17 0015 08/18/17 0609 08/18/17 0812  BP: (!) 151/63 (!) 154/58  (!) 169/50  Pulse: 72 68  65  Resp: 16 15  18   Temp: 98.3 F (36.8 C) 98.4 F (36.9 C)    TempSrc: Oral Oral    SpO2: 99% 98%  95%  Weight:   75.8 kg (167 lb)   Height:        Wt Readings from Last 3 Encounters:  08/18/17 75.8 kg (167 lb)  07/24/17 81.1 kg (178 lb 12.7 oz)  09/29/16 80.7 kg (178 lb)      Intake/Output Summary (Last 24 hours) at 08/18/2017 1235 Last data filed at 08/18/2017 1021 Gross per 24 hour  Intake 1875 ml  Output 750 ml  Net 1125 ml    Physical Exam:   GENERAL: Pleasant-appearing in no apparent distress.  HEAD, EYES, EARS, NOSE AND THROAT: Atraumatic, normocephalic. Extraocular muscles are intact. Pupils equal and reactive to light. Sclerae anicteric. No conjunctival injection. No oro-pharyngeal erythema.  NECK: Supple. There is no jugular venous distention. No bruits, no lymphadenopathy, no thyromegaly.  HEART: Regular rate and rhythm,. No murmurs, no rubs, no clicks.  LUNGS: Clear to auscultation bilaterally. No rales or rhonchi. No wheezes.  ABDOMEN: Soft, flat, nontender, nondistended. Has good bowel sounds. No hepatosplenomegaly appreciated.  EXTREMITIES: No evidence of any cyanosis, clubbing, or peripheral edema.  +2 pedal and radial pulses bilaterally.  NEUROLOGIC: The patient is alert, awake, and oriented x3 with no focal motor or sensory deficits appreciated bilaterally.  Tenderness in Lower thoraco lumbar spine better SKIN: Moist and warm with no rashes appreciated.  Psych: Not anxious, depressed LN: No inguinal LN enlargement    Antibiotics   Anti-infectives (From admission, onward)   Start     Dose/Rate Route Frequency Ordered Stop   08/17/17 1615  clindamycin (CLEOCIN) IVPB 600 mg     600 mg 100 mL/hr over 30 Minutes Intravenous  Once 08/17/17 0706 08/17/17 1650      Medications   Scheduled Meds: . amLODipine  5 mg Oral Daily  . aspirin EC  81 mg Oral Daily  . docusate sodium  100 mg Oral BID  . enoxaparin (LOVENOX) injection  40 mg Subcutaneous Q24H  . ferrous gluconate  324 mg Oral Daily  . fluticasone  1 spray Each Nare Daily  . losartan  50 mg Oral Daily   And  . hydrochlorothiazide  12.5 mg Oral Daily  . letrozole  2.5 mg Oral Daily  . magnesium oxide  400 mg Oral Daily  . metoprolol succinate  50 mg Oral Daily  .   morphine injection  0.5 mg Intravenous Once  . pantoprazole  40 mg Oral Daily  . simvastatin  20 mg Oral Daily  . vitamin B-12  1,000 mcg Oral Daily   Continuous Infusions: . sodium chloride 75 mL/hr at 08/17/17 1853   PRN Meds:.acetaminophen **OR** acetaminophen, albuterol, bisacodyl, bisacodyl, guaiFENesin, HYDROcodone-acetaminophen, LORazepam, magnesium citrate, metoCLOPramide **OR** metoCLOPramide (REGLAN) injection, morphine injection, ondansetron **OR** ondansetron (ZOFRAN) IV, traZODone   Data Review:   Micro Results Recent Results (from the past 240 hour(s))  Surgical pcr screen     Status: None   Collection Time: 08/17/17 12:18 AM  Result Value Ref Range Status   MRSA, PCR NEGATIVE NEGATIVE Final   Staphylococcus aureus NEGATIVE NEGATIVE Final    Comment: (NOTE) The Xpert SA Assay (FDA approved for NASAL specimens in patients 56 years of age and older), is one component of a comprehensive surveillance program. It is not intended to diagnose infection nor to guide or monitor treatment. Performed at [facility], [address] [place], [place] [address]     Radiology Reports Dg Lumbar Spine 2-3 Views  Result Date: 08/17/2017 CLINICAL DATA:  Spine surgery EXAM: LUMBAR SPINE - 2-3 VIEW; DG C-ARM 61-120 MIN COMPARISON:  MRI 08/16/2017, radiograph 08/07/2017 FINDINGS: Two low resolution intraoperative views of the lumbar spine. Total fluoroscopy time was 1 minutes 7 seconds. Vertebral augmentation changes at what appear to be T12 and L1 IMPRESSION: Intraoperative fluoroscopic assistance provided during lumbar spine intervention. Electronically Signed   By: Donavan Foil M.D.   On: 08/17/2017 16:59   Mr Lumbar Spine Wo Contrast  Result Date: 08/16/2017 CLINICAL DATA:  84 year old female with traumatic L1 spine fracture EXAM: MRI LUMBAR SPINE WITHOUT CONTRAST TECHNIQUE: Multiplanar, multisequence MR imaging of the lumbar spine was performed. No intravenous contrast  was administered. COMPARISON:  [facility] lumbar spine radiographs 08/07/2017 and earlier. Lumbar MRI 02/23/2012. FINDINGS: Segmentation:  Normal. Alignment: Lumbar vertebral height and alignment from L2 to the sacrum is stable since 2013, including mild chronic retrolisthesis of L2-L3 through L4-L5. T12  and L1 are described below. Vertebrae: Comminuted fracture of the L1 vertebral body with internal T2 and STIR hyperintense hemorrhage. Superimposed trace internal gas phenomena such as with avascular necrosis (series 3, image 9). 30-40% loss of vertebral body height. Prominent comminution of the central portion of the body with retropulsed of bone resulting in L1 level AP spinal canal narrowing by 33% compared to 2013. There is also increased multifactorial spinal stenosis at T12-L1 and L1-L2, detailed below. Both L1 pedicles appear to be fractured. The remaining L1 posterior elements appear intact. Chronic previously augmented T12 compression fracture with moderate to severe loss of height and mild focal kyphosis. The T11 level is intact. The L2 through L5 lumbar levels and visible sacrum are intact. Conus medullaris and cauda equina: Conus extends to the L1-L2 level. No signal abnormality in the visible lower thoracic spinal cord or conus despite the multilevel spinal stenosis stated below. Paraspinal and other soft tissues: Mild medial psoas muscle and paraspinal soft tissue edema tracking inferiorly from the L1 level greater on the left (series 4, image 16). No discrete intramuscular fluid collection. Underlying chronic lumbar erector spinae muscle atrophy greater on the right. Stable and negative visible abdominal viscera. Disc levels: T12-L1: Mild spinal stenosis related to disc osteophyte complex, mild retropulsion of the posterior inferior T12 endplate and posterior element hypertrophy. No spinal cord mass effect. L1-L2: Moderate spinal stenosis is increased since 2013 related to circumferential disc  bulge, superimposed right paracentral disc protrusion, and posterior element hypertrophy. There is mild mass effect on the conus. There is mild bilateral L1 neural foraminal stenosis. L2-L3: Chronic mildly increased multifactorial spinal stenosis related to bulky right eccentric circumferential disc protrusion and up to moderate posterior element hypertrophy. Chronic moderate to severe left lateral recess stenosis (descending left L3 nerve level). Only mild L2 foraminal stenosis. L3-L4: Chronic mild spinal stenosis and moderate to severe left lateral recess stenosis related to left eccentric bulky circumferential disc bulge with endplate spurring and mild to moderate posterior element hypertrophy. Moderate to severe left L3 neural foraminal stenosis appears stable. L4-L5: Chronic bulky right eccentric circumferential disc bulge or protrusion with severe chronic right lateral recess stenosis at the right L5 nerve level. No significant spinal stenosis. Mild to moderate left and severe right L4 neural foraminal stenosis appears stable. L5-S1: Mild chronic anterolisthesis. Chronic severe facet hypertrophy has progressed along with degenerative facet joint fluid. Mild right eccentric circumferential disc bulge. Mild right lateral recess and right L5 neural foraminal stenosis have not significantly changed. IMPRESSION: 1. Acute to subacute L1 vertebral body compression fracture is highly comminuted, demonstrates internal hemorrhage, trace gas which might indicate AVN, bilateral pedicle fractures, and some bony retropulsion resulting in moderate spinal stenosis. 30-40% loss of vertebral body height. 2. Multifactorial spinal stenosis also at the adjacent T12-L1 and L1-L2 disc spaces affects the lower thoracic spinal cord and conus with up to mild mass effect, but no associated spinal cord or conus signal abnormality. 3. Chronic previously augmented T12 compression fracture. 4. Chronic multifactorial lumbar spinal and  lateral recess stenosis L2-L3 through L4-L5 is stable to mildly progressed since 2013. Electronically Signed   By: Genevie Ann M.D.   On: 08/16/2017 20:14   Dg C-arm 1-60 Min  Result Date: 08/17/2017 CLINICAL DATA:  Spine surgery EXAM: LUMBAR SPINE - 2-3 VIEW; DG C-ARM 61-120 MIN COMPARISON:  MRI 08/16/2017, radiograph 08/07/2017 FINDINGS: Two low resolution intraoperative views of the lumbar spine. Total fluoroscopy time was 1 minutes 7 seconds. Vertebral augmentation changes at  what appear to be T12 and L1 IMPRESSION: Intraoperative fluoroscopic assistance provided during lumbar spine intervention. Electronically Signed   By: Donavan Foil M.D.   On: 08/17/2017 16:59     CBC Recent Labs  Lab 08/16/17 1644 08/17/17 0445 08/18/17 0637  WBC 10.3 9.2 11.1*  HGB 10.0* 9.5* 10.0*  HCT 30.6* 28.8* 31.2*  PLT 417 344 352  MCV 81.4 81.6 81.7  MCH 26.7 26.9 26.1  MCHC 32.8 33.0 31.9*  RDW 15.8* 15.5* 15.5*  LYMPHSABS 1.4  --   --   MONOABS 0.9  --   --   EOSABS 0.2  --   --   BASOSABS 0.1  --   --     Chemistries  Recent Labs  Lab 08/16/17 1644 08/17/17 0445 08/18/17 0637  NA 132* 133* 133*  K 3.4* 3.1* 3.8  CL 91* 96* 99*  CO2 28 28 24   GLUCOSE 112* 96 108*  BUN 23* 21* 16  CREATININE 0.72 0.79 0.71  CALCIUM 9.5 8.8* 8.9  MG 1.9  --   --   AST 22  --   --   ALT 16  --   --   ALKPHOS 76  --   --   BILITOT 0.6  --   --    ------------------------------------------------------------------------------------------------------------------ estimated creatinine clearance is 47.6 mL/min (by C-G formula based on SCr of 0.71 mg/dL). ------------------------------------------------------------------------------------------------------------------ No results for input(s): HGBA1C in the last 72 hours. ------------------------------------------------------------------------------------------------------------------ No results for input(s): CHOL, HDL, LDLCALC, TRIG, CHOLHDL, LDLDIRECT in the  last 72 hours. ------------------------------------------------------------------------------------------------------------------ No results for input(s): TSH, T4TOTAL, T3FREE, THYROIDAB in the last 72 hours.  Invalid input(s): FREET3 ------------------------------------------------------------------------------------------------------------------ No results for input(s): VITAMINB12, FOLATE, FERRITIN, TIBC, IRON, RETICCTPCT in the last 72 hours.  Coagulation profile Recent Labs  Lab 08/16/17 1644  INR 0.98    No results for input(s): DDIMER in the last 72 hours.  Cardiac Enzymes Recent Labs  Lab 08/16/17 1644 08/16/17 2351 08/17/17 0445  TROPONINI 0.05* 0.05* 0.05*   ------------------------------------------------------------------------------------------------------------------ Invalid input(s): POCBNP    Assessment & Plan   1. Vertebral compression fracture L1 and T12 S/p Vertebral kyphoplasty by Dr. Rudene Christians yesterday  2.  Spinal stenosis Orthopedic follow-up  3.  Elevated troponin secondary to demand ischemia  4.  Ambulatory dysfunction secondary to back pain Physical therapy  5.  Hypokalemia resolved  6.  History of breast cancer Oncology follow-up as outpatient  7.  Lower back pain IV morphine as needed for pain  8.  Discussed with the patient and family disposition plan Family want rehab placement for 1-2 weeks for endurance training Physical therapy evaluation Social worker consult.    Code Status Orders  (From admission, onward)        Start     Ordered   08/16/17 2349  Full code  Continuous     08/16/17 2348    Code Status History    This patient has a current code status but no historical code status.    Disposition : In one day Time Spent in minutes   25 minutes  Greater than 50% of time spent in care coordination and counseling patient regarding the condition and plan of care.   Saundra Shelling M.D on 08/18/2017 at 12:35 PM  Between  7am to 6pm - Pager - [phone]  After 6pm go to [email or web address] - Proofreader  [employer]   Office  [phone]

## 2017-08-18 NOTE — Discharge Summary (Signed)
[employer] [facility] - [place] at [facility], 84 y.o., DOB 09/21/1924, MRN [id number]. Admission date: 08/16/2017 Discharge Date 08/19/2017 Primary MD [name], MD Admitting Physician Amelia Jo, MD  Admission Diagnosis   1.  Acute on subacute L1 compression fracture 2.  Elevated troponin 3.  Hypertension 4.  History of breast cancer 5.  Emphysema   Discharge Diagnosis        1.  Vertebral compression fracture status post kyphoplasty 2.  Elevated troponin secondary to demand ischemia 3.  Hypertension 4.  History of breast cancer 5.  Emphysema  Hospital Course   84 year old elderly female patient with history of hypertension, breast cancer, emphysema, arthritis presented to the hospital with fall and back pain patient had intractable back pain and ambulatory dysfunction secondary to the pain.Marland Kitchen  She was diagnosed with L1 compression fracture 2 weeks ago.  She completed her rehab course without much improvement and she was not able to ambulate secondary to pain.  She has been following with Dr. Rudene Christians orthopedics who advised her to present to the hospital for lumbar spine MRI and for kyphoplasty procedure.  MRI lumbar spine was done when she presented to the hospital which showed a acute to subacute L1 vertebral compression fracture.  Patient had a vertebral kyphoplasty and tolerated procedure well her back pain improved and physical therapy saw the patient.  Patient family unable to take care of the patient at home requested rehab placement. social worker consult was done .   [name] MD  Significant Tests:  See full reports for all details    Dg Lumbar Spine 2-3 Views  Result Date: 08/17/2017 CLINICAL DATA:  Spine surgery EXAM: LUMBAR SPINE - 2-3 VIEW; DG C-ARM 61-120 MIN COMPARISON:  MRI 08/16/2017, radiograph 08/07/2017 FINDINGS: Two low resolution intraoperative views of the lumbar spine. Total fluoroscopy time was 1 minutes 7 seconds.  Vertebral augmentation changes at what appear to be T12 and L1 IMPRESSION: Intraoperative fluoroscopic assistance provided during lumbar spine intervention. Electronically Signed   By: Donavan Foil M.D.   On: 08/17/2017 16:59   Mr Lumbar Spine Wo Contrast  Result Date: 08/16/2017 CLINICAL DATA:  84 year old female with traumatic L1 spine fracture EXAM: MRI LUMBAR SPINE WITHOUT CONTRAST TECHNIQUE: Multiplanar, multisequence MR imaging of the lumbar spine was performed. No intravenous contrast was administered. COMPARISON:  [facility] hospital lumbar spine radiographs 08/07/2017 and earlier. Lumbar MRI 02/23/2012. FINDINGS: Segmentation:  Normal. Alignment: Lumbar vertebral height and alignment from L2 to the sacrum is stable since 2013, including mild chronic retrolisthesis of L2-L3 through L4-L5. T12 and L1 are described below. Vertebrae: Comminuted fracture of the L1 vertebral body with internal T2 and STIR hyperintense hemorrhage. Superimposed trace internal gas phenomena such as with avascular necrosis (series 3, image 9). 30-40% loss of vertebral body height. Prominent comminution of the central portion of the body with retropulsed of bone resulting in L1 level AP spinal canal narrowing by 33% compared to 2013. There is also increased multifactorial spinal stenosis at T12-L1 and L1-L2, detailed below. Both L1 pedicles appear to be fractured. The remaining L1 posterior elements appear intact. Chronic previously augmented T12 compression fracture with moderate to severe loss of height and mild focal kyphosis. The T11 level is intact. The L2 through L5 lumbar levels and visible sacrum are intact. Conus medullaris and cauda equina: Conus extends to the L1-L2 level. No signal abnormality in the visible lower thoracic spinal cord or conus despite the multilevel spinal stenosis stated below.  Paraspinal and other soft tissues: Mild medial psoas muscle and paraspinal soft tissue edema tracking inferiorly from the L1  level greater on the left (series 4, image 16). No discrete intramuscular fluid collection. Underlying chronic lumbar erector spinae muscle atrophy greater on the right. Stable and negative visible abdominal viscera. Disc levels: T12-L1: Mild spinal stenosis related to disc osteophyte complex, mild retropulsion of the posterior inferior T12 endplate and posterior element hypertrophy. No spinal cord mass effect. L1-L2: Moderate spinal stenosis is increased since 2013 related to circumferential disc bulge, superimposed right paracentral disc protrusion, and posterior element hypertrophy. There is mild mass effect on the conus. There is mild bilateral L1 neural foraminal stenosis. L2-L3: Chronic mildly increased multifactorial spinal stenosis related to bulky right eccentric circumferential disc protrusion and up to moderate posterior element hypertrophy. Chronic moderate to severe left lateral recess stenosis (descending left L3 nerve level). Only mild L2 foraminal stenosis. L3-L4: Chronic mild spinal stenosis and moderate to severe left lateral recess stenosis related to left eccentric bulky circumferential disc bulge with endplate spurring and mild to moderate posterior element hypertrophy. Moderate to severe left L3 neural foraminal stenosis appears stable. L4-L5: Chronic bulky right eccentric circumferential disc bulge or protrusion with severe chronic right lateral recess stenosis at the right L5 nerve level. No significant spinal stenosis. Mild to moderate left and severe right L4 neural foraminal stenosis appears stable. L5-S1: Mild chronic anterolisthesis. Chronic severe facet hypertrophy has progressed along with degenerative facet joint fluid. Mild right eccentric circumferential disc bulge. Mild right lateral recess and right L5 neural foraminal stenosis have not significantly changed. IMPRESSION: 1. Acute to subacute L1 vertebral body compression fracture is highly comminuted, demonstrates internal  hemorrhage, trace gas which might indicate AVN, bilateral pedicle fractures, and some bony retropulsion resulting in moderate spinal stenosis. 30-40% loss of vertebral body height. 2. Multifactorial spinal stenosis also at the adjacent T12-L1 and L1-L2 disc spaces affects the lower thoracic spinal cord and conus with up to mild mass effect, but no associated spinal cord or conus signal abnormality. 3. Chronic previously augmented T12 compression fracture. 4. Chronic multifactorial lumbar spinal and lateral recess stenosis L2-L3 through L4-L5 is stable to mildly progressed since 2013. Electronically Signed   By: Genevie Ann M.D.   On: 08/16/2017 20:14   Dg C-arm 1-60 Min  Result Date: 08/17/2017 CLINICAL DATA:  Spine surgery EXAM: LUMBAR SPINE - 2-3 VIEW; DG C-ARM 61-120 MIN COMPARISON:  MRI 08/16/2017, radiograph 08/07/2017 FINDINGS: Two low resolution intraoperative views of the lumbar spine. Total fluoroscopy time was 1 minutes 7 seconds. Vertebral augmentation changes at what appear to be T12 and L1 IMPRESSION: Intraoperative fluoroscopic assistance provided during lumbar spine intervention. Electronically Signed   By: Donavan Foil M.D.   On: 08/17/2017 16:59       Today   Subjective:   Kristy Hardy 84 year old female patient With no chest pain No shortness of breath Back pain better  Objective:   Blood pressure (!) 169/50, pulse 65, temperature 98.4 F (36.9 C), temperature source Oral, resp. rate 18, height 5\' 5"  (1.651 m), weight 75.8 kg (167 lb), SpO2 95 %.  .  Intake/Output Summary (Last 24 hours) at 08/18/2017 1431 Last data filed at 08/18/2017 1021 Gross per 24 hour  Intake 1875 ml  Output 750 ml  Net 1125 ml    Exam VITAL SIGNS: Blood pressure (!) 169/50, pulse 65, temperature 98.4 F (36.9 C), temperature source Oral, resp. rate 18, height 5\' 5"  (1.651 m), weight 75.8 kg (  167 lb), SpO2 95 %.  GENERAL:  84 y.o.-year-old patient lying in the bed with no acute distress.   EYES: Pupils equal, round, reactive to light and accommodation. No scleral icterus. Extraocular muscles intact.  HEENT: Head atraumatic, normocephalic. Oropharynx and nasopharynx clear.  NECK:  Supple, no jugular venous distention. No thyroid enlargement, no tenderness.  LUNGS: Normal breath sounds bilaterally, no wheezing, rales,rhonchi or crepitation. No use of accessory muscles of respiration.  CARDIOVASCULAR: S1, S2 normal. No murmurs, rubs, or gallops.  ABDOMEN: Soft, nontender, nondistended. Bowel sounds present. No organomegaly or mass.  EXTREMITIES: No pedal edema, cyanosis, or clubbing.  NEUROLOGIC: Cranial nerves II through XII are intact. Muscle strength 5/5 in all extremities. Sensation intact. Gait not checked.  PSYCHIATRIC: The patient is alert and oriented x 3.  SKIN: No obvious rash, lesion, or ulcer.   Data Review     CBC w Diff:  Lab Results  Component Value Date   WBC 11.1 (H) 08/18/2017   HGB 10.0 (L) 08/18/2017   HGB 13.1 09/01/2014   HCT 31.2 (L) 08/18/2017   HCT 38.8 09/01/2014   PLT 352 08/18/2017   PLT 286 09/01/2014   LYMPHOPCT 13 08/16/2017   LYMPHOPCT 24.2 09/01/2014   MONOPCT 9 08/16/2017   MONOPCT 11.1 09/01/2014   EOSPCT 2 08/16/2017   EOSPCT 4.0 09/01/2014   BASOPCT 1 08/16/2017   BASOPCT 1.5 09/01/2014   CMP:  Lab Results  Component Value Date   NA 133 (L) 08/18/2017   NA 135 09/01/2014   K 3.8 08/18/2017   K 4.1 09/01/2014   CL 99 (L) 08/18/2017   CL 98 (L) 09/01/2014   CO2 24 08/18/2017   CO2 30 09/01/2014   BUN 16 08/18/2017   BUN 19 09/01/2014   CREATININE 0.71 08/18/2017   CREATININE 0.91 09/01/2014   PROT 8.1 08/16/2017   PROT 7.8 09/01/2014   ALBUMIN 3.9 08/16/2017   ALBUMIN 4.2 09/01/2014   BILITOT 0.6 08/16/2017   BILITOT 0.5 09/01/2014   ALKPHOS 76 08/16/2017   ALKPHOS 65 09/01/2014   AST 22 08/16/2017   AST 29 09/01/2014   ALT 16 08/16/2017   ALT 19 09/01/2014  .  Micro Results Recent Results (from the past  240 hour(s))  Surgical pcr screen     Status: None   Collection Time: 08/17/17 12:18 AM  Result Value Ref Range Status   MRSA, PCR NEGATIVE NEGATIVE Final   Staphylococcus aureus NEGATIVE NEGATIVE Final    Comment: (NOTE) The Xpert SA Assay (FDA approved for NASAL specimens in patients 39 years of age and older), is one component of a comprehensive surveillance program. It is not intended to diagnose infection nor to guide or monitor treatment. Performed at [facility], [address] [place], [place] [address]         Code Status Orders  (From admission, onward)        Start     Ordered   08/16/17 2349  Full code  Continuous     08/16/17 2348    Code Status History    This patient has a current code status but no historical code status.          Follow-up Information    Hessie Knows, MD Follow up in 2 week(s).   Specialty:  Orthopedic Surgery Contact information: [address] [facility] [place] [address] [phone]           Discharge Medications   Allergies as  of 08/18/2017      Reactions   Amoxicillin-pot Clavulanate Nausea And Vomiting, Other (See Comments)   Has patient had a PCN reaction causing immediate rash, facial/tongue/throat swelling, SOB or lightheadedness with hypotension: No Has patient had a PCN reaction causing severe rash involving mucus membranes or skin necrosis: No Has patient had a PCN reaction that required hospitalization: No Has patient had a PCN reaction occurring within the last 10 years: Unknown If all of the above answers are "NO", then may proceed with Cephalosporin use.   Other Nausea And Vomiting   Sulfa Antibiotics Rash      Medication List    TAKE these medications   acetaminophen 650 MG CR tablet Commonly known as:  TYLENOL Take 650-1,300 mg by mouth every 8 (eight) hours as needed.   aspirin 81 MG tablet Take 81 mg by mouth daily.   Ferrous Gluconate 325 (36 Fe)  MG Tabs Take 1 tablet by mouth 2 (two) times daily. What changed:  when to take this   fluticasone 50 MCG/ACT nasal spray Commonly known as:  FLONASE Place 1 spray into both nostrils daily. As needed for seasonal allergies   guaiFENesin 600 MG 12 hr tablet Commonly known as:  MUCINEX Take by mouth 2 (two) times daily as needed.   letrozole 2.5 MG tablet Commonly known as:  FEMARA TAKE 1 TABLET(2.5 MG) BY MOUTH DAILY   LORazepam 1 MG tablet Commonly known as:  ATIVAN Take 1 tablet (1 mg total) by mouth at bedtime as needed for anxiety.   losartan-hydrochlorothiazide 50-12.5 MG tablet Commonly known as:  HYZAAR Take 1 tablet by mouth daily.   Magnesium 250 MG Tabs Take 1 tablet by mouth daily. As needed for indigestion   metoprolol succinate 50 MG 24 hr tablet Commonly known as:  TOPROL-XL Take 50 mg by mouth daily.   omeprazole 40 MG capsule Commonly known as:  PRILOSEC Take 40 mg by mouth daily.   PROAIR HFA 108 (90 Base) MCG/ACT inhaler Generic drug:  albuterol Inhale 1 puff into the lungs every 6 (six) hours as needed for wheezing or shortness of breath.   RA VITAMIN B-12 TR 1000 MCG Tbcr Generic drug:  Cyanocobalamin Take 1 tablet by mouth daily.          Total Time in preparing paper work, data evaluation and todays exam - 35 minutes  Saundra Shelling M.D on 08/18/2017 at 2:31 PM [place]  [phone]

## 2017-08-18 NOTE — Care Management Important Message (Signed)
Important Message  Patient Details  Name: Kristy Hardy MRN: [id number] Date of Birth: 08-07-1924   Medicare Important Message Given:  Yes    Juliann Pulse A Eloy Fehl 08/18/2017, 11:09 AM

## 2017-08-18 NOTE — Progress Notes (Signed)
Pt in more pain than usual in right lower back. Pt is unable to sit up in bed or raise head of bed above 20 degrees. Pt lays flat to aleviate pain. Pedal pulses intact and pt can move all extremities and has full sensation. MD notified. New orders received.

## 2017-08-18 NOTE — Clinical Social Work Placement (Signed)
   CLINICAL SOCIAL WORK PLACEMENT  NOTE  Date:  08/18/2017  Patient Details  Name: Kristy Hardy MRN: [id number] Date of Birth: 02-24-1925  Clinical Social Work is seeking post-discharge placement for this patient at the [place] level of care (*CSW will initial, date and re-position this form in  chart as items are completed):  Yes   Patient/family provided with [place] Work Department's list of facilities offering this level of care within the geographic area requested by the patient (or if unable, by the patient's family).  Yes   Patient/family informed of their freedom to choose among providers that offer the needed level of care, that participate in Medicare, Medicaid or managed care program needed by the patient, have an available bed and are willing to accept the patient.  Yes   Patient/family informed of [place]'s ownership interest in [facility] and [facility], as well as of the fact that they are under no obligation to receive care at these facilities.  PASRR submitted to EDS on       PASRR number received on       Existing PASRR number confirmed on 08/17/17     FL2 transmitted to all facilities in geographic area requested by pt/family on 08/18/17     FL2 transmitted to all facilities within larger geographic area on       Patient informed that his/her managed care company has contracts with or will negotiate with certain facilities, including the following:        Yes   Patient/family informed of bed offers received.  Patient chooses bed at [facility] )     Physician recommends and patient chooses bed at      Patient to be transferred to   on  .  Patient to be transferred to facility by       Patient family notified on   of transfer.  Name of family member notified:        PHYSICIAN       Additional Comment:    _______________________________________________ Chelcee Korpi, Veronia Beets,  LCSW 08/18/2017, 4:22 PM

## 2017-08-18 NOTE — Evaluation (Signed)
Physical Therapy Evaluation Patient Details Name: Kristy Hardy MRN: [id number] DOB: [birth year] Today's Date: 08/18/2017   History of Present Illness  Pt admitted secondary to L1 compression fracture. Son reports approx 2 weeks ago, she fell and went to SNF to treat conservately, however pain continued to be severe. Pt has not been out of bed since this past Wednesday. Per [facility] consult, surgery recommended and he performed kyphoplasty on L1 on 08/17/17. Of note, previous sx on T12. Other history includes breast cancer and COPD.  Clinical Impression  Pt is a pleasant 84 year old female who was admitted for L1 compression fracture and is s/p kyphoplasty. Pt performs bed mobility, transfers, and ambulation with min assist and RW. Pt initially able to transfer to chair, however was very uncomfortable and requested to return back to bed. Pt demonstrates deficits with strength/mobility/pain. Educated in back precautions, however had limited carryover in functional mobility, will need continuing education for these needs. Would benefit from skilled PT to address above deficits and promote optimal return to PLOF; recommend transition to STR upon discharge from acute hospitalization.       Follow Up Recommendations SNF    Equipment Recommendations  None recommended by PT    Recommendations for Other Services       Precautions / Restrictions Precautions Precautions: Fall;Back Precaution Booklet Issued: No Restrictions Weight Bearing Restrictions: No      Mobility  Bed Mobility Overal bed mobility: Needs Assistance Bed Mobility: Supine to Sit     Supine to sit: Min assist     General bed mobility comments: needs cues and assist to perform log rolling to L side prior to sitting at EOB. Once seated at EOB, pt complains of heavy pain.  Transfers Overall transfer level: Needs assistance Equipment used: Rolling walker (2 wheeled) Transfers: Sit to/from Stand Sit to Stand: Min  assist         General transfer comment: upright posture, however reports increased pain with standing. Heavy use of B UE on RW  Ambulation/Gait Ambulation/Gait assistance: Min assist Ambulation Distance (Feet): 3 Feet Assistive device: Rolling walker (2 wheeled) Gait Pattern/deviations: Step-to pattern     General Gait Details: slow ambulation over to recliner. Reports slightly decreased pain with ambulation compared to static sitting. Once seated in recliner, couldn't get comfortable, requested to return back to bed.   Stairs            Wheelchair Mobility    Modified Rankin (Stroke Patients Only)       Balance Overall balance assessment: History of Falls;Needs assistance Sitting-balance support: Feet supported Sitting balance-Leahy Scale: Good     Standing balance support: Bilateral upper extremity supported Standing balance-Leahy Scale: Good                               Pertinent Vitals/Pain Pain Assessment: Faces Faces Pain Scale: Hurts worst Pain Location: low back towards R side Pain Descriptors / Indicators: Guarding;Grimacing Pain Intervention(s): Limited activity within patient's tolerance;Premedicated before session;Repositioned    Home Living Family/patient expects to be discharged to:: Private residence Living Arrangements: Alone   Type of Home: House         Home Equipment: Walker - 2 wheels Additional Comments: has been at SNF for 2 weeks, planning to return at discharge    Prior Function Level of Independence: Independent with assistive device(s)         Comments: previously using RW  Hand Dominance        Extremity/Trunk Assessment   Upper Extremity Assessment Upper Extremity Assessment: Generalized weakness(B UE grossly 4/5)    Lower Extremity Assessment Lower Extremity Assessment: Generalized weakness(B LE grossly 4/5)       Communication   Communication: No difficulties  Cognition  Arousal/Alertness: Awake/alert Behavior During Therapy: WFL for tasks assessed/performed Overall Cognitive Status: Within Functional Limits for tasks assessed                                        General Comments      Exercises Other Exercises Other Exercises: supine ther-ex performed on B LE including ankle pumps, quad sets, and heel slides. All ther-ex performed x 10 reps with supervision.   Assessment/Plan    PT Assessment Patient needs continued PT services  PT Problem List Decreased strength;Decreased activity tolerance;Decreased balance;Decreased mobility;Decreased knowledge of use of DME;Pain       PT Treatment Interventions DME instruction;Gait training;Stair training;Therapeutic exercise;Balance training    PT Goals (Current goals can be found in the Care Plan section)  Acute Rehab PT Goals Patient Stated Goal: to get stronger PT Goal Formulation: With patient Time For Goal Achievement: 09/01/17 Potential to Achieve Goals: Good    Frequency 7X/week   Barriers to discharge Decreased caregiver support      Co-evaluation               AM-PAC PT "6 Clicks" Daily Activity  Outcome Measure Difficulty turning over in bed (including adjusting bedclothes, sheets and blankets)?: Unable Difficulty moving from lying on back to sitting on the side of the bed? : Unable Difficulty sitting down on and standing up from a chair with arms (e.g., wheelchair, bedside commode, etc,.)?: Unable Help needed moving to and from a bed to chair (including a wheelchair)?: A Little Help needed walking in hospital room?: A Little Help needed climbing 3-5 steps with a railing? : A Lot 6 Click Score: 11    End of Session Equipment Utilized During Treatment: Gait belt Activity Tolerance: Patient tolerated treatment well Patient left: in bed;with bed alarm set;with SCD's reapplied Nurse Communication: Mobility status PT Visit Diagnosis: Unsteadiness on feet  (R26.81);Muscle weakness (generalized) (M62.81);Difficulty in walking, not elsewhere classified (R26.2);Pain Pain - Right/Left: (bilat) Pain - part of body: (back)    Time: 7681-1572 PT Time Calculation (min) (ACUTE ONLY): 27 min   Charges:   PT Evaluation $PT Eval Moderate Complexity: 1 Mod PT Treatments $Therapeutic Exercise: 8-22 mins   PT G CodesGreggory Stallion, PT, DPT [phone]   Chala Gul 08/18/2017, 1:10 PM

## 2017-08-18 NOTE — Progress Notes (Signed)
   Subjective: 1 Day Post-Op Procedure(s) (LRB): KYPHOPLASTY-L1 (N/A) Patient reports pain as moderate.  States pain prior to surgery has improved. Patient is well, and has had no acute complaints or problems Denies any CP, SOB, ABD pain. No numbness/tingling or radicular symptoms   Objective: Vital signs in last 24 hours: Temp:  [97.7 F (36.5 C)-98.4 F (36.9 C)] 98.4 F (36.9 C) (03/29 0015) Pulse Rate:  [63-72] 68 (03/29 0015) Resp:  [11-23] 15 (03/29 0015) BP: (128-180)/(49-93) 154/58 (03/29 0015) SpO2:  [97 %-100 %] 98 % (03/29 0015) FiO2 (%):  [32 %] 32 % (03/28 1649) Weight:  [75.8 kg (167 lb)] 75.8 kg (167 lb) (03/29 0609)  Intake/Output from previous day: 03/28 0701 - 03/29 0700 In: 2305 [P.O.:360; I.V.:1945] Out: 750 [Urine:750] Intake/Output this shift: No intake/output data recorded.  Recent Labs    08/16/17 1644 08/17/17 0445 08/18/17 0637  HGB 10.0* 9.5* 10.0*   Recent Labs    08/17/17 0445 08/18/17 0637  WBC 9.2 11.1*  RBC 3.53* 3.82  HCT 28.8* 31.2*  PLT 344 352   Recent Labs    08/17/17 0445 08/18/17 0637  NA 133* 133*  K 3.1* 3.8  CL 96* 99*  CO2 28 24  BUN 21* 16  CREATININE 0.79 0.71  GLUCOSE 96 108*  CALCIUM 8.8* 8.9   Recent Labs    08/16/17 1644  INR 0.98    EXAM General - Patient is Alert and Appropriate  Lumbar spine - minimally tender to palpation along spinous process and paravertebral muscles Extremity - Sensation intact distally Intact pulses distally Dorsiflexion/Plantar flexion intact Dressing - dressing C/D/I Motor Function - intact, moving foot and toes well on exam.   Past Medical History:  Diagnosis Date  . Arthritis   . Breast cancer ([place])   . Cancer ([place]) 11-11-91   left breast   . Cancer ([place]) 08-12-14   right breast, ER/PR positive, Her2 negative  . Carcinoma of left breast ([place])    Left breast carcinoma; patient had lumpectomy radiation therapy   . COPD (chronic obstructive pulmonary disease)  ([place])   . Heart valve problem    leaking  . History of colonoscopy 2012  . History of mammogram 2016  . Pneumonia July 2014    Assessment/Plan:   1 Day Post-Op Procedure(s) (LRB): KYPHOPLASTY-L1 (N/A) Active Problems:   Lumbar compression fracture (H[facility])  Estimated body mass index is 27.79 kg/m as calculated from the following:   Height as of this encounter: '5\' 5"'$  (1.651 m).   Weight as of this encounter: 75.8 kg (167 lb).  Patient can progress activity as tolerated Follow up with [facility] in 2 weeks for xrays of lumbar spine Patient may remove bandaid and begin showering  T. Rachelle Hora, PA-C [place] 08/18/2017, 8:11 AM

## 2017-08-19 LAB — GLUCOSE, CAPILLARY: Glucose-Capillary: 99 mg/dL (ref 65–99)

## 2017-08-19 MED ORDER — METHOCARBAMOL 500 MG PO TABS: 500.0000 mg | ORAL_TABLET | Freq: Four times a day (QID) | ORAL | 0 refills | Status: DC

## 2017-08-19 MED ORDER — DOCUSATE SODIUM 100 MG PO CAPS: 100.0000 mg | ORAL_CAPSULE | Freq: Two times a day (BID) | ORAL | 0 refills | Status: DC

## 2017-08-19 MED ORDER — BISACODYL 5 MG PO TBEC: 5.0000 mg | DELAYED_RELEASE_TABLET | Freq: Every day | ORAL | 0 refills | Status: DC | PRN

## 2017-08-19 MED ORDER — BISACODYL 10 MG RE SUPP: 10.0000 mg | Freq: Every day | RECTAL | 0 refills | Status: DC | PRN

## 2017-08-19 MED ORDER — HYDROCODONE-ACETAMINOPHEN 5-325 MG PO TABS: 1.0000 | ORAL_TABLET | Freq: Four times a day (QID) | ORAL | 0 refills | Status: DC | PRN

## 2017-08-19 NOTE — Progress Notes (Signed)
Pt ready for discharge to [facility]. Report called to Sunday Spillers, Therapist, sports. Pt will transport Via ems.  Pts Son at bedside.

## 2017-08-19 NOTE — Progress Notes (Signed)
Patient is medically stable for discharge today. Magda Paganini, admission coordinator at [facility], stated that patient can come to Rm 511. CSW sent discharge packets via HUB. Patient is aware of above. RN to call report and arrange for EMS to transport. Patient and patient's son Elta Guadeloupe is aware of above. Please reconsult if future social work needs arises. Social work Architectural technologist off.  Susy Frizzle, Social Work Intern [phone]

## 2017-08-19 NOTE — Progress Notes (Signed)
Patient is stable for discharge.  Still having lots of back pain pain even with little movement.  No pain medicines, patient is going to [facility],  discharge summary done by Dr. Estanislado Pandy, I will add narcotic pain medicines along with stool softeners,

## 2017-08-19 NOTE — Plan of Care (Signed)

## 2017-08-19 NOTE — Discharge Summary (Signed)
[facility], 84 y.o., DOB Jan 24, 1925, MRN [id number]. Admission date: 08/16/2017 Discharge Date 08/19/2017 Primary MD [name], MD Admitting Physician Amelia Jo, MD  Admission Diagnosis   1.  Acute on subacute L1 compression fracture 2.  Elevated troponin 3.  Hypertension 4.  History of breast cancer 5.  Emphysema   Discharge Diagnosis        1.  Vertebral compression fracture status post kyphoplasty 2.  Elevated troponin secondary to demand ischemia 3.  Hypertension 4.  History of breast cancer 5.  Emphysema  Hospital Course   84 year old elderly female patient with history of hypertension, breast cancer, emphysema, arthritis presented to the hospital with fall and back pain patient had intractable back pain and ambulatory dysfunction secondary to the pain.Marland Kitchen  She was diagnosed with L1 compression fracture 2 weeks ago.  She completed her rehab course without much improvement and she was not able to ambulate secondary to pain.  She has been following with Dr. Rudene Christians orthopedics who advised her to present to the hospital for lumbar spine MRI and for kyphoplasty procedure.  MRI lumbar spine was done when she presented to the hospital which showed a acute to subacute L1 vertebral compression fracture.  Patient had a vertebral kyphoplasty and tolerated procedure well her back pain improved and physical therapy saw the patient.  Patient family unable to take care of the patient at home requested rehab placement. social worker consult was done . Still has lots of back pain, constipation so I have added  Norco, Dulcolax, Colace, Robaxin.  [name] MD  Significant Tests:  See full reports for all details    Dg Lumbar Spine 2-3 Views  Result Date: 08/17/2017 CLINICAL DATA:  Spine surgery EXAM: LUMBAR SPINE - 2-3 VIEW; DG C-ARM 61-120 MIN COMPARISON:  MRI 08/16/2017, radiograph 08/07/2017 FINDINGS: Two low resolution  intraoperative views of the lumbar spine. Total fluoroscopy time was 1 minutes 7 seconds. Vertebral augmentation changes at what appear to be T12 and L1 IMPRESSION: Intraoperative fluoroscopic assistance provided during lumbar spine intervention. Electronically Signed   By: Donavan Foil M.D.   On: 08/17/2017 16:59   Mr Lumbar Spine Wo Contrast  Result Date: 08/16/2017 CLINICAL DATA:  84 year old female with traumatic L1 spine fracture EXAM: MRI LUMBAR SPINE WITHOUT CONTRAST TECHNIQUE: Multiplanar, multisequence MR imaging of the lumbar spine was performed. No intravenous contrast was administered. COMPARISON:  [facility] hospital lumbar spine radiographs 08/07/2017 and earlier. Lumbar MRI 02/23/2012. FINDINGS: Segmentation:  Normal. Alignment: Lumbar vertebral height and alignment from L2 to the sacrum is stable since 2013, including mild chronic retrolisthesis of L2-L3 through L4-L5. T12 and L1 are described below. Vertebrae: Comminuted fracture of the L1 vertebral body with internal T2 and STIR hyperintense hemorrhage. Superimposed trace internal gas phenomena such as with avascular necrosis (series 3, image 9). 30-40% loss of vertebral body height. Prominent comminution of the central portion of the body with retropulsed of bone resulting in L1 level AP spinal canal narrowing by 33% compared to 2013. There is also increased multifactorial spinal stenosis at T12-L1 and L1-L2, detailed below. Both L1 pedicles appear to be fractured. The remaining L1 posterior elements appear intact. Chronic previously augmented T12 compression fracture with moderate to severe loss of height and mild focal kyphosis. The T11 level is intact. The L2 through L5 lumbar levels and visible sacrum are intact. Conus medullaris and cauda equina: Conus extends to the L1-L2 level. No signal abnormality in  the visible lower thoracic spinal cord or conus despite the multilevel spinal stenosis stated below. Paraspinal and other soft tissues:  Mild medial psoas muscle and paraspinal soft tissue edema tracking inferiorly from the L1 level greater on the left (series 4, image 16). No discrete intramuscular fluid collection. Underlying chronic lumbar erector spinae muscle atrophy greater on the right. Stable and negative visible abdominal viscera. Disc levels: T12-L1: Mild spinal stenosis related to disc osteophyte complex, mild retropulsion of the posterior inferior T12 endplate and posterior element hypertrophy. No spinal cord mass effect. L1-L2: Moderate spinal stenosis is increased since 2013 related to circumferential disc bulge, superimposed right paracentral disc protrusion, and posterior element hypertrophy. There is mild mass effect on the conus. There is mild bilateral L1 neural foraminal stenosis. L2-L3: Chronic mildly increased multifactorial spinal stenosis related to bulky right eccentric circumferential disc protrusion and up to moderate posterior element hypertrophy. Chronic moderate to severe left lateral recess stenosis (descending left L3 nerve level). Only mild L2 foraminal stenosis. L3-L4: Chronic mild spinal stenosis and moderate to severe left lateral recess stenosis related to left eccentric bulky circumferential disc bulge with endplate spurring and mild to moderate posterior element hypertrophy. Moderate to severe left L3 neural foraminal stenosis appears stable. L4-L5: Chronic bulky right eccentric circumferential disc bulge or protrusion with severe chronic right lateral recess stenosis at the right L5 nerve level. No significant spinal stenosis. Mild to moderate left and severe right L4 neural foraminal stenosis appears stable. L5-S1: Mild chronic anterolisthesis. Chronic severe facet hypertrophy has progressed along with degenerative facet joint fluid. Mild right eccentric circumferential disc bulge. Mild right lateral recess and right L5 neural foraminal stenosis have not significantly changed. IMPRESSION: 1. Acute to subacute  L1 vertebral body compression fracture is highly comminuted, demonstrates internal hemorrhage, trace gas which might indicate AVN, bilateral pedicle fractures, and some bony retropulsion resulting in moderate spinal stenosis. 30-40% loss of vertebral body height. 2. Multifactorial spinal stenosis also at the adjacent T12-L1 and L1-L2 disc spaces affects the lower thoracic spinal cord and conus with up to mild mass effect, but no associated spinal cord or conus signal abnormality. 3. Chronic previously augmented T12 compression fracture. 4. Chronic multifactorial lumbar spinal and lateral recess stenosis L2-L3 through L4-L5 is stable to mildly progressed since 2013. Electronically Signed   By: Genevie Ann M.D.   On: 08/16/2017 20:14   Dg C-arm 1-60 Min  Result Date: 08/17/2017 CLINICAL DATA:  Spine surgery EXAM: LUMBAR SPINE - 2-3 VIEW; DG C-ARM 61-120 MIN COMPARISON:  MRI 08/16/2017, radiograph 08/07/2017 FINDINGS: Two low resolution intraoperative views of the lumbar spine. Total fluoroscopy time was 1 minutes 7 seconds. Vertebral augmentation changes at what appear to be T12 and L1 IMPRESSION: Intraoperative fluoroscopic assistance provided during lumbar spine intervention. Electronically Signed   By: Donavan Foil M.D.   On: 08/17/2017 16:59       Today   Subjective:   Kristy Hardy 84 year old female patient With no chest pain No shortness of breath Back pain better  Objective:   Blood pressure (!) 157/57, pulse 61, temperature 97.7 F (36.5 C), temperature source Oral, resp. rate 18, height 5\' 5"  (1.651 m), weight 81.6 kg (180 lb), SpO2 91 %.  .  Intake/Output Summary (Last 24 hours) at 08/19/2017 0843 Last data filed at 08/19/2017 0649 Gross per 24 hour  Intake 1468.75 ml  Output 1100 ml  Net 368.75 ml    Exam VITAL SIGNS: Blood pressure (!) 157/57, pulse 61, temperature 97.7 F (36.5  C), temperature source Oral, resp. rate 18, height 5\' 5"  (1.651 m), weight 81.6 kg (180 lb), SpO2  91 %.  GENERAL:  84 y.o.-year-old patient lying in the bed with no acute distress.  EYES: Pupils equal, round, reactive to light and accommodation. No scleral icterus. Extraocular muscles intact.  HEENT: Head atraumatic, normocephalic. Oropharynx and nasopharynx clear.  NECK:  Supple, no jugular venous distention. No thyroid enlargement, no tenderness.  LUNGS: Normal breath sounds bilaterally, no wheezing, rales,rhonchi or crepitation. No use of accessory muscles of respiration.  CARDIOVASCULAR: S1, S2 normal. No murmurs, rubs, or gallops.  ABDOMEN: Soft, nontender, nondistended. Bowel sounds present. No organomegaly or mass.  EXTREMITIES: No pedal edema, cyanosis, or clubbing.  NEUROLOGIC: Cranial nerves II through XII are intact. Muscle strength 5/5 in all extremities. Sensation intact. Gait not checked.  PSYCHIATRIC: The patient is alert and oriented x 3.  SKIN: No obvious rash, lesion, or ulcer.   Data Review     CBC w Diff:  Lab Results  Component Value Date   WBC 11.1 (H) 08/18/2017   HGB 10.0 (L) 08/18/2017   HGB 13.1 09/01/2014   HCT 31.2 (L) 08/18/2017   HCT 38.8 09/01/2014   PLT 352 08/18/2017   PLT 286 09/01/2014   LYMPHOPCT 13 08/16/2017   LYMPHOPCT 24.2 09/01/2014   MONOPCT 9 08/16/2017   MONOPCT 11.1 09/01/2014   EOSPCT 2 08/16/2017   EOSPCT 4.0 09/01/2014   BASOPCT 1 08/16/2017   BASOPCT 1.5 09/01/2014   CMP:  Lab Results  Component Value Date   NA 133 (L) 08/18/2017   NA 135 09/01/2014   K 3.8 08/18/2017   K 4.1 09/01/2014   CL 99 (L) 08/18/2017   CL 98 (L) 09/01/2014   CO2 24 08/18/2017   CO2 30 09/01/2014   BUN 16 08/18/2017   BUN 19 09/01/2014   CREATININE 0.71 08/18/2017   CREATININE 0.91 09/01/2014   PROT 8.1 08/16/2017   PROT 7.8 09/01/2014   ALBUMIN 3.9 08/16/2017   ALBUMIN 4.2 09/01/2014   BILITOT 0.6 08/16/2017   BILITOT 0.5 09/01/2014   ALKPHOS 76 08/16/2017   ALKPHOS 65 09/01/2014   AST 22 08/16/2017   AST 29 09/01/2014   ALT 16  08/16/2017   ALT 19 09/01/2014  .  Micro Results Recent Results (from the past 240 hour(s))  Surgical pcr screen     Status: None   Collection Time: 08/17/17 12:18 AM  Result Value Ref Range Status   MRSA, PCR NEGATIVE NEGATIVE Final   Staphylococcus aureus NEGATIVE NEGATIVE Final    Comment: (NOTE) The Xpert SA Assay (FDA approved for NASAL specimens in patients 41 years of age and older), is one component of a comprehensive surveillance program. It is not intended to diagnose infection nor to guide or monitor treatment. Performed at [facility], [address] [place], [place] [address]         Code Status Orders  (From admission, onward)        Start     Ordered   08/16/17 2349  Full code  Continuous     08/16/17 2348    Code Status History    This patient has a current code status but no historical code status.           Contact information for follow-up providers    Hessie Knows, MD Follow up in 2 week(s).   Specialty:  Orthopedic Surgery Contact information: [place]  [place] [address] [phone]            Contact information for after-discharge care    [facility] .   Service:  Skilled Nursing Contact information: [place] [phone]                  Discharge Medications   Allergies as of 08/19/2017      Reactions   Amoxicillin-pot Clavulanate Nausea And Vomiting, Other (See Comments)   Has patient had a PCN reaction causing immediate rash, facial/tongue/throat swelling, SOB or lightheadedness with hypotension: No Has patient had a PCN reaction causing severe rash involving mucus membranes or skin necrosis: No Has patient had a PCN reaction that required hospitalization: No Has patient had a PCN reaction occurring within the last 10 years: Unknown If all of the above answers are "NO",  then may proceed with Cephalosporin use.   Other Nausea And Vomiting   Sulfa Antibiotics Rash      Medication List    STOP taking these medications   acetaminophen 650 MG CR tablet Commonly known as:  TYLENOL     TAKE these medications   aspirin 81 MG tablet Take 81 mg by mouth daily.   bisacodyl 5 MG EC tablet Commonly known as:  DULCOLAX Take 1 tablet (5 mg total) by mouth daily as needed for moderate constipation.   bisacodyl 10 MG suppository Commonly known as:  DULCOLAX Place 1 suppository (10 mg total) rectally daily as needed for moderate constipation.   docusate sodium 100 MG capsule Commonly known as:  COLACE Take 1 capsule (100 mg total) by mouth 2 (two) times daily.   Ferrous Gluconate 325 (36 Fe) MG Tabs Take 1 tablet by mouth 2 (two) times daily. What changed:  when to take this   fluticasone 50 MCG/ACT nasal spray Commonly known as:  FLONASE Place 1 spray into both nostrils daily. As needed for seasonal allergies   guaiFENesin 600 MG 12 hr tablet Commonly known as:  MUCINEX Take by mouth 2 (two) times daily as needed.   HYDROcodone-acetaminophen 5-325 MG tablet Commonly known as:  NORCO/VICODIN Take 1 tablet by mouth every 6 (six) hours as needed for moderate pain.   letrozole 2.5 MG tablet Commonly known as:  FEMARA TAKE 1 TABLET(2.5 MG) BY MOUTH DAILY   LORazepam 1 MG tablet Commonly known as:  ATIVAN Take 1 tablet (1 mg total) by mouth at bedtime as needed for anxiety.   losartan-hydrochlorothiazide 50-12.5 MG tablet Commonly known as:  HYZAAR Take 1 tablet by mouth daily.   Magnesium 250 MG Tabs Take 1 tablet by mouth daily. As needed for indigestion   methocarbamol 500 MG tablet Commonly known as:  ROBAXIN Take 1 tablet (500 mg total) by mouth 4 (four) times daily.   metoprolol succinate 50 MG 24 hr tablet Commonly known as:  TOPROL-XL Take 50 mg by mouth daily.   omeprazole 40 MG capsule Commonly known as:  PRILOSEC Take 40 mg  by mouth daily.   PROAIR HFA 108 (90 Base) MCG/ACT inhaler Generic drug:  albuterol Inhale 1 puff into the lungs every 6 (six) hours as needed for wheezing or shortness of breath.   RA VITAMIN B-12 TR 1000 MCG Tbcr Generic drug:  Cyanocobalamin Take 1 tablet by mouth daily.          Total Time in preparing paper work, data evaluation and todays exam - 35 minutes  Niclas Markell  Vianne Bulls M.D on 08/19/2017 at 8:43 AM [facility]   Office  [phone]

## 2017-08-19 NOTE — Progress Notes (Signed)
Physical Therapy Treatment Patient Details Name: Kristy Hardy MRN: [id number] DOB: [birth year] Today's Date: 08/19/2017    History of Present Illness Pt admitted secondary to L1 compression fracture. Son reports approx 2 weeks ago, she fell and went to SNF to treat conservately, however pain continued to be severe. Pt has not been out of bed since this past Wednesday. Per [facility] consult, surgery recommended and he performed kyphoplasty on L1 on 08/17/17. Of note, previous sx on T12. Other history includes breast cancer and COPD.    PT Comments    Pt in bed, laying flat.  Pt reported significant back pain and requested pain medication from nursing.  Pt wanted to try exercises/mobility this am in hopes it would decrease her pain.  While she gave a good effort she remained limited and was only able to tolerate minimal exercises.  Rolling to left with mod assist.  Primary nurse in gave pain medication.  Anticipate discharge to rehab today.   Follow Up Recommendations  SNF     Equipment Recommendations  None recommended by PT    Recommendations for Other Services       Precautions / Restrictions Precautions Precautions: Fall;Back Precaution Booklet Issued: No Restrictions Weight Bearing Restrictions: No    Mobility  Bed Mobility Overal bed mobility: Needs Assistance Bed Mobility: Rolling Rolling: Mod assist            Transfers                    Ambulation/Gait                 Stairs            Wheelchair Mobility    Modified Rankin (Stroke Patients Only)       Balance                                            Cognition Arousal/Alertness: Awake/alert Behavior During Therapy: WFL for tasks assessed/performed Overall Cognitive Status: Within Functional Limits for tasks assessed                                        Exercises Other Exercises Other Exercises: supine SLR and heel slides x 8  bilaterally.  - painful    General Comments        Pertinent Vitals/Pain Pain Assessment: Faces Faces Pain Scale: Hurts worst Pain Location: low back towards R side Pain Descriptors / Indicators: Guarding;Grimacing Pain Intervention(s): Limited activity within patient's tolerance;RN gave pain meds during session    Home Living                      Prior Function            PT Goals (current goals can now be found in the care plan section) Progress towards PT goals: Progressing toward goals    Frequency    7X/week      PT Plan Current plan remains appropriate    Co-evaluation              AM-PAC PT "6 Clicks" Daily Activity  Outcome Measure  Difficulty turning over in bed (including adjusting bedclothes, sheets and blankets)?: Unable Difficulty moving from lying on back to sitting on the side of  the bed? : Unable Difficulty sitting down on and standing up from a chair with arms (e.g., wheelchair, bedside commode, etc,.)?: Unable Help needed moving to and from a bed to chair (including a wheelchair)?: A Little Help needed walking in hospital room?: A Little Help needed climbing 3-5 steps with a railing? : A Lot 6 Click Score: 11    End of Session   Activity Tolerance: Patient limited by pain Patient left: in bed;with call bell/phone within reach;with nursing/sitter in room;with bed alarm set Nurse Communication: Other (comment);Patient requests pain meds       Time: 0825-0834 PT Time Calculation (min) (ACUTE ONLY): 9 min  Charges:  $Therapeutic Exercise: 8-22 mins                    G Codes:       Chesley Noon, PTA 08/19/17, 9:29 AM

## 2017-08-22 NOTE — Anesthesia Postprocedure Evaluation (Signed)
Anesthesia Post Note  Patient: Kristy Hardy  Procedure(s) Performed: KYPHOPLASTY-L1 (N/A )  Patient location during evaluation: PACU Anesthesia Type: General Level of consciousness: awake and alert Pain management: pain level controlled Vital Signs Assessment: post-procedure vital signs reviewed and stable Respiratory status: spontaneous breathing, nonlabored ventilation, respiratory function stable and patient connected to nasal cannula oxygen Cardiovascular status: blood pressure returned to baseline and stable Postop Assessment: no apparent nausea or vomiting Anesthetic complications: no     Last Vitals:  Vitals:   08/18/17 2336 08/19/17 0817  BP: (!) 138/50 (!) 157/57  Pulse: 60 61  Resp: 19 18  Temp: 36.7 C 36.5 C  SpO2: 94% 91%    Last Pain:  Vitals:   08/19/17 0930  TempSrc:   PainSc: [place] G [name]

## 2017-08-25 ENCOUNTER — Inpatient Hospital Stay
Admission: EM | Admit: 2017-08-25 | Discharge: 2017-08-28 | DRG: 378 | Disposition: A | Discharge status: Home / Self Care | Payer: Medicare Other | Attending: Internal Medicine | Admitting: Internal Medicine

## 2017-08-25 ENCOUNTER — Other Ambulatory Visit: Payer: Self-pay

## 2017-08-25 DIAGNOSIS — Z7982 Long term (current) use of aspirin: Secondary | ICD-10-CM

## 2017-08-25 DIAGNOSIS — M1990 Unspecified osteoarthritis, unspecified site: Secondary | ICD-10-CM | POA: Diagnosis present

## 2017-08-25 DIAGNOSIS — Z85038 Personal history of other malignant neoplasm of large intestine: Secondary | ICD-10-CM

## 2017-08-25 DIAGNOSIS — M549 Dorsalgia, unspecified: Secondary | ICD-10-CM | POA: Diagnosis present

## 2017-08-25 DIAGNOSIS — Z170 Estrogen receptor positive status [ER+]: Secondary | ICD-10-CM | POA: Diagnosis not present

## 2017-08-25 DIAGNOSIS — I959 Hypotension, unspecified: Secondary | ICD-10-CM | POA: Diagnosis present

## 2017-08-25 DIAGNOSIS — R7989 Other specified abnormal findings of blood chemistry: Secondary | ICD-10-CM

## 2017-08-25 DIAGNOSIS — K921 Melena: Secondary | ICD-10-CM | POA: Diagnosis present

## 2017-08-25 DIAGNOSIS — K210 Gastro-esophageal reflux disease with esophagitis: Secondary | ICD-10-CM | POA: Diagnosis present

## 2017-08-25 DIAGNOSIS — D122 Benign neoplasm of ascending colon: Secondary | ICD-10-CM | POA: Diagnosis present

## 2017-08-25 DIAGNOSIS — Z883 Allergy status to other anti-infective agents status: Secondary | ICD-10-CM

## 2017-08-25 DIAGNOSIS — H9190 Unspecified hearing loss, unspecified ear: Secondary | ICD-10-CM | POA: Diagnosis present

## 2017-08-25 DIAGNOSIS — Z9049 Acquired absence of other specified parts of digestive tract: Secondary | ICD-10-CM

## 2017-08-25 DIAGNOSIS — C50919 Malignant neoplasm of unspecified site of unspecified female breast: Secondary | ICD-10-CM

## 2017-08-25 DIAGNOSIS — K449 Diaphragmatic hernia without obstruction or gangrene: Secondary | ICD-10-CM | POA: Diagnosis present

## 2017-08-25 DIAGNOSIS — Z882 Allergy status to sulfonamides status: Secondary | ICD-10-CM | POA: Diagnosis not present

## 2017-08-25 DIAGNOSIS — I350 Nonrheumatic aortic (valve) stenosis: Secondary | ICD-10-CM | POA: Diagnosis present

## 2017-08-25 DIAGNOSIS — Z853 Personal history of malignant neoplasm of breast: Secondary | ICD-10-CM | POA: Diagnosis not present

## 2017-08-25 DIAGNOSIS — D62 Acute posthemorrhagic anemia: Secondary | ICD-10-CM | POA: Diagnosis present

## 2017-08-25 DIAGNOSIS — J449 Chronic obstructive pulmonary disease, unspecified: Secondary | ICD-10-CM | POA: Diagnosis present

## 2017-08-25 DIAGNOSIS — K922 Gastrointestinal hemorrhage, unspecified: Secondary | ICD-10-CM | POA: Diagnosis present

## 2017-08-25 DIAGNOSIS — Z800 Family history of malignant neoplasm of digestive organs: Secondary | ICD-10-CM | POA: Diagnosis not present

## 2017-08-25 DIAGNOSIS — D508 Other iron deficiency anemias: Secondary | ICD-10-CM

## 2017-08-25 DIAGNOSIS — E7800 Pure hypercholesterolemia, unspecified: Secondary | ICD-10-CM | POA: Diagnosis present

## 2017-08-25 DIAGNOSIS — R748 Abnormal levels of other serum enzymes: Secondary | ICD-10-CM | POA: Diagnosis present

## 2017-08-25 DIAGNOSIS — Z803 Family history of malignant neoplasm of breast: Secondary | ICD-10-CM | POA: Diagnosis not present

## 2017-08-25 DIAGNOSIS — K640 First degree hemorrhoids: Secondary | ICD-10-CM | POA: Diagnosis present

## 2017-08-25 DIAGNOSIS — G8929 Other chronic pain: Secondary | ICD-10-CM | POA: Diagnosis present

## 2017-08-25 DIAGNOSIS — I248 Other forms of acute ischemic heart disease: Secondary | ICD-10-CM | POA: Diagnosis present

## 2017-08-25 DIAGNOSIS — E876 Hypokalemia: Secondary | ICD-10-CM | POA: Diagnosis not present

## 2017-08-25 DIAGNOSIS — I4891 Unspecified atrial fibrillation: Secondary | ICD-10-CM | POA: Diagnosis not present

## 2017-08-25 DIAGNOSIS — E785 Hyperlipidemia, unspecified: Secondary | ICD-10-CM | POA: Diagnosis present

## 2017-08-25 DIAGNOSIS — Z7951 Long term (current) use of inhaled steroids: Secondary | ICD-10-CM

## 2017-08-25 DIAGNOSIS — R778 Other specified abnormalities of plasma proteins: Secondary | ICD-10-CM

## 2017-08-25 DIAGNOSIS — I10 Essential (primary) hypertension: Secondary | ICD-10-CM | POA: Diagnosis present

## 2017-08-25 LAB — URINALYSIS, COMPLETE (UACMP) WITH MICROSCOPIC
Bacteria, UA: NONE SEEN
Bilirubin Urine: NEGATIVE
GLUCOSE, UA: NEGATIVE mg/dL
KETONES UR: NEGATIVE mg/dL
Nitrite: NEGATIVE
PH: 5 (ref 5.0–8.0)
Protein, ur: NEGATIVE mg/dL
SPECIFIC GRAVITY, URINE: 1.019 (ref 1.005–1.030)

## 2017-08-25 LAB — CBC WITH DIFFERENTIAL/PLATELET
Basophils Absolute: 0.1 10*3/uL (ref 0–0.1)
Basophils Relative: 0 %
EOS ABS: 0 10*3/uL (ref 0–0.7)
Eosinophils Relative: 0 %
HCT: 24.7 % — ABNORMAL LOW (ref 35.0–47.0)
HEMOGLOBIN: 7.8 g/dL — AB (ref 12.0–16.0)
Lymphocytes Relative: 8 %
Lymphs Abs: 1.1 10*3/uL (ref 1.0–3.6)
MCH: 26.3 pg (ref 26.0–34.0)
MCHC: 31.7 g/dL — ABNORMAL LOW (ref 32.0–36.0)
MCV: 83 fL (ref 80.0–100.0)
Monocytes Absolute: 1.6 10*3/uL — ABNORMAL HIGH (ref 0.2–0.9)
Monocytes Relative: 13 %
NEUTROS PCT: 79 %
Neutro Abs: 10.4 10*3/uL — ABNORMAL HIGH (ref 1.4–6.5)
Platelets: 451 10*3/uL — ABNORMAL HIGH (ref 150–440)
RBC: 2.97 MIL/uL — AB (ref 3.80–5.20)
RDW: 16.3 % — ABNORMAL HIGH (ref 11.5–14.5)
WBC: 13.2 10*3/uL — AB (ref 3.6–11.0)

## 2017-08-25 LAB — COMPREHENSIVE METABOLIC PANEL
ALT: 14 U/L (ref 14–54)
ANION GAP: 12 (ref 5–15)
AST: 31 U/L (ref 15–41)
Albumin: 3.4 g/dL — ABNORMAL LOW (ref 3.5–5.0)
Alkaline Phosphatase: 79 U/L (ref 38–126)
BILIRUBIN TOTAL: 0.6 mg/dL (ref 0.3–1.2)
BUN: 39 mg/dL — ABNORMAL HIGH (ref 6–20)
CO2: 25 mmol/L (ref 22–32)
Calcium: 8.8 mg/dL — ABNORMAL LOW (ref 8.9–10.3)
Chloride: 98 mmol/L — ABNORMAL LOW (ref 101–111)
Creatinine, Ser: 1.28 mg/dL — ABNORMAL HIGH (ref 0.44–1.00)
GFR calc Af Amer: 41 mL/min — ABNORMAL LOW (ref >60)
GFR, EST NON AFRICAN AMERICAN: 36 mL/min — AB (ref >60)
Glucose, Bld: 134 mg/dL — ABNORMAL HIGH (ref 65–99)
POTASSIUM: 3.4 mmol/L — AB (ref 3.5–5.1)
Sodium: 135 mmol/L (ref 135–145)
TOTAL PROTEIN: 7.2 g/dL (ref 6.5–8.1)

## 2017-08-25 LAB — TROPONIN I
TROPONIN I: 0.79 ng/mL — AB (ref <0.03)
Troponin I: 0.79 ng/mL (ref <0.03)
Troponin I: 1 ng/mL (ref <0.03)

## 2017-08-25 MED ORDER — MAGNESIUM OXIDE 400 (241.3 MG) MG PO TABS
200.0000 mg | ORAL_TABLET | Freq: Every day | ORAL | Status: DC
  Administered 2017-08-26 – 2017-08-28 (×2): 200 mg via ORAL
  Filled 2017-08-25 (×2): qty 1

## 2017-08-25 MED ORDER — TRAMADOL HCL 50 MG PO TABS
50.0000 mg | ORAL_TABLET | Freq: Two times a day (BID) | ORAL | Status: DC
  Administered 2017-08-25 – 2017-08-28 (×4): 50 mg via ORAL
  Filled 2017-08-25 (×4): qty 1

## 2017-08-25 MED ORDER — SODIUM CHLORIDE 0.9 % IV SOLN
Freq: Once | INTRAVENOUS | Status: AC
  Administered 2017-08-25: 22:00:00 via INTRAVENOUS

## 2017-08-25 MED ORDER — OXYCODONE-ACETAMINOPHEN 5-325 MG PO TABS
1.0000 | ORAL_TABLET | Freq: Four times a day (QID) | ORAL | Status: DC | PRN
  Filled 2017-08-25: qty 1

## 2017-08-25 MED ORDER — LETROZOLE 2.5 MG PO TABS
2.5000 mg | ORAL_TABLET | Freq: Every day | ORAL | Status: DC
  Administered 2017-08-26 – 2017-08-28 (×2): 2.5 mg via ORAL
  Filled 2017-08-25 (×3): qty 1

## 2017-08-25 MED ORDER — ONDANSETRON HCL 4 MG/2ML IJ SOLN: 4.0000 mg | Freq: Four times a day (QID) | INTRAMUSCULAR | Status: DC | PRN

## 2017-08-25 MED ORDER — GUAIFENESIN ER 600 MG PO TB12: 600.0000 mg | ORAL_TABLET | Freq: Two times a day (BID) | ORAL | Status: DC | PRN

## 2017-08-25 MED ORDER — VITAMIN B-12 1000 MCG PO TABS
1000.0000 ug | ORAL_TABLET | Freq: Every day | ORAL | Status: DC
  Administered 2017-08-26 – 2017-08-28 (×2): 1000 ug via ORAL
  Filled 2017-08-25 (×2): qty 1

## 2017-08-25 MED ORDER — ACETAMINOPHEN 650 MG RE SUPP: 650.0000 mg | Freq: Four times a day (QID) | RECTAL | Status: DC | PRN

## 2017-08-25 MED ORDER — PANTOPRAZOLE SODIUM 40 MG IV SOLR
40.0000 mg | Freq: Once | INTRAVENOUS | Status: AC
  Administered 2017-08-25: 40 mg via INTRAVENOUS
  Filled 2017-08-25: qty 40

## 2017-08-25 MED ORDER — FLUTICASONE PROPIONATE 50 MCG/ACT NA SUSP
1.0000 | Freq: Two times a day (BID) | NASAL | Status: DC
  Administered 2017-08-25 – 2017-08-26 (×2): 1 via NASAL
  Filled 2017-08-25: qty 16

## 2017-08-25 MED ORDER — LORAZEPAM 1 MG PO TABS: 1.0000 mg | ORAL_TABLET | Freq: Every evening | ORAL | Status: DC | PRN

## 2017-08-25 MED ORDER — ACETAMINOPHEN 325 MG PO TABS
650.0000 mg | ORAL_TABLET | Freq: Four times a day (QID) | ORAL | Status: DC | PRN
  Administered 2017-08-27 (×2): 650 mg via ORAL
  Filled 2017-08-25 (×2): qty 2

## 2017-08-25 MED ORDER — ALBUTEROL SULFATE (2.5 MG/3ML) 0.083% IN NEBU: 2.5000 mg | INHALATION_SOLUTION | Freq: Four times a day (QID) | RESPIRATORY_TRACT | Status: DC | PRN

## 2017-08-25 MED ORDER — ONDANSETRON HCL 4 MG PO TABS: 4.0000 mg | ORAL_TABLET | Freq: Four times a day (QID) | ORAL | Status: DC | PRN

## 2017-08-25 MED ORDER — FERROUS GLUCONATE 324 (38 FE) MG PO TABS
324.0000 mg | ORAL_TABLET | Freq: Two times a day (BID) | ORAL | Status: DC
  Administered 2017-08-25 – 2017-08-28 (×5): 324 mg via ORAL
  Filled 2017-08-25 (×7): qty 1

## 2017-08-25 MED ORDER — POLYETHYLENE GLYCOL 3350 17 G PO PACK: 17.0000 g | PACK | Freq: Every day | ORAL | Status: DC | PRN

## 2017-08-25 MED ORDER — SODIUM CHLORIDE 0.9 % IV SOLN
INTRAVENOUS | Status: DC
  Administered 2017-08-25: 19:00:00 via INTRAVENOUS

## 2017-08-25 NOTE — Consult Note (Signed)
Cardiology Consultation Note    Patient ID: Kristy Hardy, MRN: [id number], DOB/AGE: 27-Oct-1924 83 y.o. Admit date: 08/25/2017   Date of Consult: 08/25/2017 Primary Physician: Kristy Pink, MD Primary Cardiologist: Dr. Ubaldo Hardy  Chief Complaint: gi bleed Reason for Consultation: gi bleed/abnormal troponin Requesting MD: Dr. Fritzi Hardy  HPI: Kristy Hardy is a 84 y.o. female with history of moderate aortic stenosis, hypertension, hyperlipidemia who presented to the emergency room with weakness and fatigue as well as complaints of melanotic stools.  She was not on anticoagulation but was on aspirin at 81 mg for antiplatelet therapy.  It also notes in her chart that she has been on ferrous sulfate, tramadol.  She is also been on diclofenac topical gel.  Blood pressure has been treated with losartan-hydrochlorothiazide, metoprolol succinate and amlodipine.  She had an echocardiogram done in January 2019 with an ejection fraction of 55% with moderate TR mild MR and moderate to severe aortic stenosis with a peak flow across the aortic valve of 3.2 m/s with some peak gradient of 41.6 mmHg and a mean gradient of 24 mmHg.  She was treated medically for this.  In the emergency room she was noted to have blood in her stools with a dizziness, hypotension and systolic blood pressure in the 70s.  Hemoglobin was 7.8 down from 10.8 on August 18, 2017.  Serum troponin was 0.79.  Patient was treated at [facility] recently after a fall with dizziness and was felt to have community-acquired pneumonia.  She was discharged on August 10, 2017 on a regimen including Omnicef, nitrofurantoin, oxycodone, tramadol and continued on amlodipine, aspirin, albuterol, losartan hydrochlorothiazide as well as metoprolol succinate at 50 mg daily.  She was seen in her extended care facility on 08/21/2017.  Blood pressure was relatively low at 105/59.  She was significantly hypotensive on presentation with systolic  pressures in the 70s.  She is currently more stable with a systolic blood pressure of 95.  Past Medical History:  Diagnosis Date  . Arthritis   . Breast cancer ([place])   . Cancer ([place]) 11-11-91   left breast   . Cancer ([place]) 08-12-14   right breast, ER/PR positive, Her2 negative  . Carcinoma of left breast ([place])    Left breast carcinoma; patient had lumpectomy radiation therapy   . COPD (chronic obstructive pulmonary disease) ([place])   . Heart valve problem    leaking  . History of colonoscopy 2012  . History of mammogram 2016  . Pneumonia July 2014      Surgical History:  Past Surgical History:  Procedure Laterality Date  . BREAST BIOPSY Bilateral [id number]   Positive  . BREAST SURGERY Left 11-11-91   lumpectomy with radiation, Tamoxifen for 2 years.Dr. Sharlet Salina  . BREAST SURGERY Right 09/09/14   RIGHT BREAST WIDE EXCISION   . CHOLECYSTECTOMY  02/23/1972  . COLONOSCOPY    . KYPHOPLASTY N/A 08/17/2017   Procedure: [id number];  Surgeon: Hessie Knows, MD;  Location: [facility] ORS;  Service: Orthopedics;  Laterality: N/A;     Home Meds: Prior to Admission medications   Medication Sig Start Date End Date Taking? Authorizing Provider  aspirin 81 MG tablet Take 81 mg by mouth daily.   Yes [provider]  Cyanocobalamin (RA VITAMIN B-12 TR) 1000 MCG TBCR Take 1 tablet by mouth daily.   Yes [provider]  Ferrous Gluconate 325 (36 FE) MG TABS Take 1 tablet by mouth 2 (two) times daily. 02/03/15  Yes Choksi, [place],  MD  fluticasone (FLONASE) 50 MCG/ACT nasal spray Place 1 spray into both nostrils 2 (two) times daily.  05/30/14  Yes [provider]  guaiFENesin (MUCINEX) 600 MG 12 hr tablet Take by mouth 2 (two) times daily as needed.   Yes [provider]  letrozole (FEMARA) 2.5 MG tablet TAKE 1 TABLET(2.5 MG) BY MOUTH DAILY 10/31/16  Yes Cammie Sickle, MD  LORazepam (ATIVAN) 1 MG tablet Take 1 tablet (1 mg total) by mouth at bedtime as needed for  anxiety. 09/29/14  Yes Choksi, Delorise Shiner, MD  losartan-hydrochlorothiazide (HYZAAR) 50-12.5 MG per tablet Take 1 tablet by mouth daily.  05/21/14  Yes [provider]  Magnesium 250 MG TABS Take 1 tablet by mouth daily.    Yes [provider]  metoprolol succinate (TOPROL-XL) 50 MG 24 hr tablet Take 50 mg by mouth daily.  05/21/14  Yes [provider]  oxyCODONE-acetaminophen (PERCOCET/ROXICET) 5-325 MG tablet Take 1 tablet by mouth every 6 (six) hours as needed for severe pain.   Yes [provider]  PROAIR HFA 108 (90 Base) MCG/ACT inhaler Inhale 1 puff into the lungs every 6 (six) hours as needed for wheezing or shortness of breath.  05/31/16  Yes [provider]  pseudoephedrine-guaifenesin (MUCINEX D) 60-600 MG 12 hr tablet Take 1 tablet by mouth every 12 (twelve) hours as needed for congestion.   Yes [provider]  sennosides-docusate sodium (SENOKOT-S) 8.6-50 MG tablet Take 2 tablets by mouth daily.   Yes [provider]  traMADol (ULTRAM) 50 MG tablet Take 50 mg by mouth 2 (two) times daily.   Yes [provider]    Inpatient Medications:     Allergies:  Allergies  Allergen Reactions  . Amoxicillin-Pot Clavulanate Nausea And Vomiting and Other (See Comments)    Has patient had a PCN reaction causing immediate rash, facial/tongue/throat swelling, SOB or lightheadedness with hypotension: No Has patient had a PCN reaction causing severe rash involving mucus membranes or skin necrosis: No Has patient had a PCN reaction that required hospitalization: No Has patient had a PCN reaction occurring within the last 10 years: Unknown If all of the above answers are "NO", then may proceed with Cephalosporin use.   . Other Nausea And Vomiting  . Sulfa Antibiotics Rash    Social History   Socioeconomic History  . Marital status: Widowed    Spouse name: Not on file  . Number of children: Not on file  . Years of education:  Not on file  . Highest education level: Not on file  Occupational History  . Not on file  Social Needs  . Financial resource strain: Not on file  . Food insecurity:    Worry: Not on file    Inability: Not on file  . Transportation needs:    Medical: Not on file    Non-medical: Not on file  Tobacco Use  . Smoking status: Never Smoker  . Smokeless tobacco: Never Used  Substance and Sexual Activity  . Alcohol use: No    Alcohol/week: 0.0 oz  . Drug use: No  . Sexual activity: Not on file  Lifestyle  . Physical activity:    Days per week: Not on file    Minutes per session: Not on file  . Stress: Not on file  Relationships  . Social connections:    Talks on phone: Not on file    Gets together: Not on file    Attends religious service: Not on file  Active member of club or organization: Not on file    Attends meetings of clubs or organizations: Not on file    Relationship status: Not on file  . Intimate partner violence:    Fear of current or ex partner: Not on file    Emotionally abused: Not on file    Physically abused: Not on file    Forced sexual activity: Not on file  Other Topics Concern  . Not on file  Social History Narrative  . Not on file     Family History  Problem Relation Age of Onset  . Colon cancer Mother 51  . Colon cancer Brother 62  . Breast cancer Sister 27     Review of Systems: A 12-system review of systems was performed and is negative except as noted in the HPI.  Labs: Recent Labs    08/25/17 1356  TROPONINI 0.79*   Lab Results  Component Value Date   WBC 13.2 (H) 08/25/2017   HGB 7.8 (L) 08/25/2017   HCT 24.7 (L) 08/25/2017   MCV 83.0 08/25/2017   PLT 451 (H) 08/25/2017    Recent Labs  Lab 08/25/17 1356  NA 135  K 3.4*  CL 98*  CO2 25  BUN 39*  CREATININE 1.28*  CALCIUM 8.8*  PROT 7.2  BILITOT 0.6  ALKPHOS 79  ALT 14  AST 31  GLUCOSE 134*   No results found for: CHOL, HDL, LDLCALC, TRIG No results found for:  DDIMER  Radiology/Studies:  Dg Lumbar Spine 2-3 Views  Result Date: 08/17/2017 CLINICAL DATA:  Spine surgery EXAM: LUMBAR SPINE - 2-3 VIEW; DG C-ARM 61-120 MIN COMPARISON:  MRI 08/16/2017, radiograph 08/07/2017 FINDINGS: Two low resolution intraoperative views of the lumbar spine. Total fluoroscopy time was 1 minutes 7 seconds. Vertebral augmentation changes at what appear to be T12 and L1 IMPRESSION: Intraoperative fluoroscopic assistance provided during lumbar spine intervention. Electronically Signed   By: Donavan Foil M.D.   On: 08/17/2017 16:59   Mr Lumbar Spine Wo Contrast  Result Date: 08/16/2017 CLINICAL DATA:  84 year old female with traumatic L1 spine fracture EXAM: MRI LUMBAR SPINE WITHOUT CONTRAST TECHNIQUE: Multiplanar, multisequence MR imaging of the lumbar spine was performed. No intravenous contrast was administered. COMPARISON:  [facility] hospital lumbar spine radiographs 08/07/2017 and earlier. Lumbar MRI 02/23/2012. FINDINGS: Segmentation:  Normal. Alignment: Lumbar vertebral height and alignment from L2 to the sacrum is stable since 2013, including mild chronic retrolisthesis of L2-L3 through L4-L5. T12 and L1 are described below. Vertebrae: Comminuted fracture of the L1 vertebral body with internal T2 and STIR hyperintense hemorrhage. Superimposed trace internal gas phenomena such as with avascular necrosis (series 3, image 9). 30-40% loss of vertebral body height. Prominent comminution of the central portion of the body with retropulsed of bone resulting in L1 level AP spinal canal narrowing by 33% compared to 2013. There is also increased multifactorial spinal stenosis at T12-L1 and L1-L2, detailed below. Both L1 pedicles appear to be fractured. The remaining L1 posterior elements appear intact. Chronic previously augmented T12 compression fracture with moderate to severe loss of height and mild focal kyphosis. The T11 level is intact. The L2 through L5 lumbar levels and visible sacrum  are intact. Conus medullaris and cauda equina: Conus extends to the L1-L2 level. No signal abnormality in the visible lower thoracic spinal cord or conus despite the multilevel spinal stenosis stated below. Paraspinal and other soft tissues: Mild medial psoas muscle and paraspinal soft tissue edema tracking inferiorly from the L1  level greater on the left (series 4, image 16). No discrete intramuscular fluid collection. Underlying chronic lumbar erector spinae muscle atrophy greater on the right. Stable and negative visible abdominal viscera. Disc levels: T12-L1: Mild spinal stenosis related to disc osteophyte complex, mild retropulsion of the posterior inferior T12 endplate and posterior element hypertrophy. No spinal cord mass effect. L1-L2: Moderate spinal stenosis is increased since 2013 related to circumferential disc bulge, superimposed right paracentral disc protrusion, and posterior element hypertrophy. There is mild mass effect on the conus. There is mild bilateral L1 neural foraminal stenosis. L2-L3: Chronic mildly increased multifactorial spinal stenosis related to bulky right eccentric circumferential disc protrusion and up to moderate posterior element hypertrophy. Chronic moderate to severe left lateral recess stenosis (descending left L3 nerve level). Only mild L2 foraminal stenosis. L3-L4: Chronic mild spinal stenosis and moderate to severe left lateral recess stenosis related to left eccentric bulky circumferential disc bulge with endplate spurring and mild to moderate posterior element hypertrophy. Moderate to severe left L3 neural foraminal stenosis appears stable. L4-L5: Chronic bulky right eccentric circumferential disc bulge or protrusion with severe chronic right lateral recess stenosis at the right L5 nerve level. No significant spinal stenosis. Mild to moderate left and severe right L4 neural foraminal stenosis appears stable. L5-S1: Mild chronic anterolisthesis. Chronic severe facet  hypertrophy has progressed along with degenerative facet joint fluid. Mild right eccentric circumferential disc bulge. Mild right lateral recess and right L5 neural foraminal stenosis have not significantly changed. IMPRESSION: 1. Acute to subacute L1 vertebral body compression fracture is highly comminuted, demonstrates internal hemorrhage, trace gas which might indicate AVN, bilateral pedicle fractures, and some bony retropulsion resulting in moderate spinal stenosis. 30-40% loss of vertebral body height. 2. Multifactorial spinal stenosis also at the adjacent T12-L1 and L1-L2 disc spaces affects the lower thoracic spinal cord and conus with up to mild mass effect, but no associated spinal cord or conus signal abnormality. 3. Chronic previously augmented T12 compression fracture. 4. Chronic multifactorial lumbar spinal and lateral recess stenosis L2-L3 through L4-L5 is stable to mildly progressed since 2013. Electronically Signed   By: Genevie Ann M.D.   On: 08/16/2017 20:14   Dg C-arm 1-60 Min  Result Date: 08/17/2017 CLINICAL DATA:  Spine surgery EXAM: LUMBAR SPINE - 2-3 VIEW; DG C-ARM 61-120 MIN COMPARISON:  MRI 08/16/2017, radiograph 08/07/2017 FINDINGS: Two low resolution intraoperative views of the lumbar spine. Total fluoroscopy time was 1 minutes 7 seconds. Vertebral augmentation changes at what appear to be T12 and L1 IMPRESSION: Intraoperative fluoroscopic assistance provided during lumbar spine intervention. Electronically Signed   By: Donavan Foil M.D.   On: 08/17/2017 16:59    Wt Readings from Last 3 Encounters:  08/25/17 77 kg (169 lb 11.2 oz)  08/19/17 81.6 kg (180 lb)  07/24/17 81.1 kg (178 lb 12.7 oz)    EKG: EKG reveals sinus rhythm with LVH and incomplete right bundle branch block. Physical Exam:  Blood pressure (!) 122/58, pulse 94, temperature (!) 97.5 F (36.4 C), temperature source Oral, resp. rate 18, height '5\' 5"'  (1.651 m), weight 77 kg (169 lb 11.2 oz), SpO2 97 %. Body mass  index is 28.24 kg/m. General: Well developed, well nourished, in no acute distress. Head: Normocephalic, atraumatic, sclera non-icteric, no xanthomas, nares are without discharge.  Neck: Negative for carotid bruits. JVD not elevated. Lungs: Clear bilaterally to auscultation without wheezes, rales, or rhonchi. Breathing is unlabored. Heart: RRR with S1 S2.  2/6 to 3/6 systolic murmur radiating to the  carotids and outflow tract.  No diastolic murmur. Abdomen: Soft, non-tender, non-distended with normoactive bowel sounds. No hepatomegaly. No rebound/guarding. No obvious abdominal masses. Msk:  Strength and tone appear normal for age. Extremities: No clubbing or cyanosis. No edema.  Distal pedal pulses are 2+ and equal bilaterally. Neuro: Alert and oriented X 3. No facial asymmetry. No focal deficit. Moves all extremities spontaneously. Psych:  Responds to questions appropriately with a normal affect.   Assessment and plan    84 year old female with history of moderate aortic stenosis, hypertension and hyperlipidemia who presented to the emergency room with melanotic stools and was noted to be hypotensive.  She also complained of dizziness and lightheadedness.  Her serum troponin is mildly elevated 0.79.  She was also anemic with a hemoglobin of 7.8 down from 10.0  7 days ago.  She denies chest pain.  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  GI bleed-etiology unclear.  Being seen by gastroenterology.  Will discontinue aspirin and any nonsteroidals.  She will likely need colonoscopy.  She is optimized as well as possible.  Will hold any antihypertensive agents for now given her relative hypotension.  She had been on amlodipine, lisinopril-hydrochlorothiazide as well as metoprolol succinate.  She is not tachycardic at present.  Hypertension-likely secondary to GI bleed and continued use of antihypertensive agents.   Will stop amlodipine and lisinopril.  Will resume beta-blocker should she develop tachycardia if hemodynamics allowed.  Would use short acting metoprolol at 0.5-25 mg twice daily if needed.  Elevated serum troponin-likely secondary to demand secondary to anemia.  Patient not a candidate for invasive evaluation.  Would discontinue aspirin as per above based on GI bleed.  Would only use beta-blockers as needed if pressure tolerates  Aortic stenosis-moderate aortic stenosis by echo.  Flow across the aortic valve is less than 4 m/s.  We will follow with you.

## 2017-08-25 NOTE — Progress Notes (Signed)
LCSW met with patient and completed assessment with son and daughter present. The son and daughter are patients HCPOA and are not sure if patient will  return to [employer].  LCSW reviewed good self care practices for caregivers and provided resources and contact information if the needed in home care vs returning to [place]. Explained no decisions needed to be made right now.  [employer] LCSW [phone]

## 2017-08-25 NOTE — Clinical Social Work Note (Signed)
Clinical Social Work Assessment  Patient Details  Name: Kristy Hardy MRN: [id number] Date of Birth: [birth year]  Date of referral:  08/25/17               Reason for consult:  Facility Placement, Family Concerns                Permission sought to share information with:  Family Supports, Customer service manager Permission granted to share information::  Yes, Verbal Permission Granted  Name::     Son Keisha Amer [phone] daughter Margarette Tamala Julian [phone]  Agency::  [place]  Relationship::     Contact Information:     Housing/Transportation Living arrangements for the past 2 months:  [place], 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  Yes Need for family participation in patient care:  Yes (Comment)  Care giving concerns: Son and daughter of patients concerned of patient care at [place] / plan: LCSW introduced my self to patient and obtained verbal consent to speak to her children and family memebers. Patient is oriented x4 and a little confused when interviewed by doctor. Patient is hard of hearing and wears glasses and is able to give verbal commands. She reports she is working hard at [employer] at [employer] and really wants to return home. Son lives next door and daughter visits in the day and son at night and her younger sister gives her children some days off. Patient has excellent family support. Medical flooe SW will follow up with family to see what they would like for their mother ( return to [place] or in home supports. ZLCSW completed  Assessment and Fl2 started. Employment status:  Retired Forensic scientist:  Medicare PT Recommendations:  Not assessed at this time Information / Referral to community resources:   None at this time  Patient/Family's Response to care:  Good understanding  Patient/Family's Understanding of and Emotional Response to Diagnosis, Current Treatment, and Prognosis:  Good understanding  Emotional Assessment Appearance:  Appears older than stated age Attitude/Demeanor/Rapport:  Gracious Affect (typically observed):  Calm, Adaptable Orientation:  Oriented to Place, Oriented to  Time, Oriented to Situation, Oriented to Self Alcohol / Substance use:  Not Applicable Psych involvement (Current and /or in the community):  No (Comment)  Discharge Needs  Concerns to be addressed:  Care Coordination, Discharge Planning Concerns Readmission within the last 30 days:  Yes Current discharge risk:  Dependent with Mobility Barriers to Discharge:  No Barriers Identified   Joana Reamer, LCSW 08/25/2017, 3:17 PM

## 2017-08-25 NOTE — ED Notes (Signed)
Daughter out to nursing station asking for Dr Alice Reichert to be asked if her surgery a couple weeks ago could be the cause of the bleeding.

## 2017-08-25 NOTE — ED Triage Notes (Signed)
Pt reports two episodes of dark black stools in the past week, pt states today while she was doing pt she felt like she was going to pass out, pt states that she is at [facility] for rehab for a compression fracture

## 2017-08-25 NOTE — H&P (View-Only) (Signed)
GI Inpatient Consult Note  Reason for Consult: Melena/GIB   Attending Requesting Consult: Dr. Fritzi Mandes  History of Present Illness: Kristy Hardy is a 84 y.o. female seen for evaluation of melena at the request of Dr. Fritzi Mandes. Pt was recently discharged from hospital 08/18/2017 2/2 vertebral compression fracture s/p kyphoplasty. Pt came to ED today with dizziness, SBP in 70s, and melena. Hemoglobin today was 7.8, at discharge last week was 10.8. Patient states she has noticed black, tarry stools since Wednesday. She endorses two episodes of melena this morning, which prompted coming to the ED. She denies any abdominal pain, nausea, vomiting, dysphagia, GERD, rectal pain/burning. She denies frequent NSAID use. Pt not on any blood thinners. Denies bismuth/iron use. States she was on iron therapy in the past, not currently taking. She does take a baby aspirin daily. Pt states her mother had colon cancer. No hx of GI bleeds, gastric/duodenal ulcers.   Patient seen and examined bedside in no acute distress. Patient states she is more tired than at her baseline, but denies any abdominal pain. She is accompanied by two family members bedside who help with the history. Patient states she sees Dr. Ubaldo Glassing for her cardiology issues.    Last Colonoscopy: Not seen in EMR, per daughter "about 10 years ago" Last Endoscopy: Patient denies   Past Medical History:  Past Medical History:  Diagnosis Date  . Arthritis   . Breast cancer ([place])   . Cancer ([place]) 11-11-91   left breast   . Cancer ([place]) 08-12-14   right breast, ER/PR positive, Her2 negative  . Carcinoma of left breast ([place])    Left breast carcinoma; patient had lumpectomy radiation therapy   . COPD (chronic obstructive pulmonary disease) ([place])   . Heart valve problem    leaking  . History of colonoscopy 2012  . History of mammogram 2016  . Pneumonia July 2014    Problem List: Patient Active Problem List   Diagnosis Date Noted  .  GI bleed 08/25/2017  . Lumbar compression fracture ([place]) 08/16/2017  . Carcinoma of upper-inner quadrant of right breast in female, estrogen receptor positive ([place]) 04/01/2016  . History of breast cancer 08/10/2015  . Iron deficiency anemia 02/03/2015  . Carcinoma of right breast ([place]) 10/12/2014  . Malignant neoplasm of hepatic flexure of colon ([place]) 09/29/2014  . Aortic heart valve narrowing 03/31/2014  . Benign neoplasm of colon 08/28/2013  . HLD (hyperlipidemia) 08/28/2013  . Hypercholesterolemia without hypertriglyceridemia 08/28/2013    Past Surgical History: Past Surgical History:  Procedure Laterality Date  . BREAST BIOPSY Bilateral [id number]   Positive  . BREAST SURGERY Left 11-11-91   lumpectomy with radiation, Tamoxifen for 2 years.Dr. Sharlet Salina  . BREAST SURGERY Right 09/09/14   RIGHT BREAST WIDE EXCISION   . CHOLECYSTECTOMY  02/23/1972  . COLONOSCOPY    . KYPHOPLASTY N/A 08/17/2017   Procedure: [id number];  Surgeon: Hessie Knows, MD;  Location: [facility] ORS;  Service: Orthopedics;  Laterality: N/A;    Allergies: Allergies  Allergen Reactions  . Amoxicillin-Pot Clavulanate Nausea And Vomiting and Other (See Comments)    Has patient had a PCN reaction causing immediate rash, facial/tongue/throat swelling, SOB or lightheadedness with hypotension: No Has patient had a PCN reaction causing severe rash involving mucus membranes or skin necrosis: No Has patient had a PCN reaction that required hospitalization: No Has patient had a PCN reaction occurring within the last 10 years: Unknown If all of the above answers are "NO", then may proceed  with Cephalosporin use.   . Other Nausea And Vomiting  . Sulfa Antibiotics Rash    Home Medications:  (Not in a hospital admission) Home medication reconciliation was completed with the patient.   Scheduled Inpatient Medications:    Continuous Inpatient Infusions:    PRN Inpatient Medications:    Family History: family  history includes Breast cancer (age of onset: 49) in her sister; Colon cancer (age of onset: 37) in her mother; Colon cancer (age of onset: 59) in her brother.  The patient's family history is negative for inflammatory bowel disorders, GI malignancy, or solid organ transplantation.  Social History:   reports that she has never smoked. She has never used smokeless tobacco. She reports that she does not drink alcohol or use drugs. The patient denies ETOH, tobacco, or drug use.   Review of Systems: Constitutional: Weight is stable.  Eyes: No changes in vision. ENT: No oral lesions, sore throat.  GI: see HPI.  Heme/Lymph: No easy bruising.  CV: No chest pain.  GU: No hematuria.  Integumentary: No rashes.  Neuro: No headaches.  Psych: No depression/anxiety.  Endocrine: No heat/cold intolerance.  Allergic/Immunologic: No urticaria.  Resp: No cough, SOB.  Musculoskeletal: No joint swelling.    Physical Examination: BP (!) 122/58 (BP Location: Right Arm)   Pulse 94   Temp (!) 97.5 F (36.4 C) (Oral)   Resp 18   Ht _0  (1.651 m)   Wt 77 kg (169 lb 11.2 oz)   SpO2 97%   BMI 28.24 kg/m   Non-toxic appearing elderly female in hospital bed. Answers all questions appropriately. Accompanied by daughter who helps answer questions.  Gen: NAD, alert and oriented x 4 HEENT: PEERLA, EOMI, Neck: supple, no JVD or thyromegaly Chest: CTA bilaterally, no wheezes, crackles, or other adventitious sounds CV: RRR, no m/g/c/r Abd: soft, NT, ND, +BS in all four quadrants; no HSM, guarding, ridigity, or rebound tenderness Ext: no edema, well perfused with 2+ pulses, Skin: no rash or lesions noted Lymph: no LAD  Data: Lab Results  Component Value Date   WBC 13.2 (H) 08/25/2017   HGB 7.8 (L) 08/25/2017   HCT 24.7 (L) 08/25/2017   MCV 83.0 08/25/2017   PLT 451 (H) 08/25/2017   Recent Labs  Lab 08/25/17 1356  HGB 7.8*   Lab Results  Component Value Date   NA 135 08/25/2017   K 3.4 (L)  08/25/2017   CL 98 (L) 08/25/2017   CO2 25 08/25/2017   BUN 39 (H) 08/25/2017   CREATININE 1.28 (H) 08/25/2017   Lab Results  Component Value Date   ALT 14 08/25/2017   AST 31 08/25/2017   ALKPHOS 79 08/25/2017   BILITOT 0.6 08/25/2017   No results for input(s): APTT, INR, PTT in the last 168 hours. Assessment/Plan:  Ms. Gainey is a 84 y/o Caucasian female with a PMH of moderate aortic stenosis, HTN, and hyperlipidemia consulted for melena/anemia.  1. Melena: - Patient not on anticoagulation/blood thinners, infrequent NSAID use - Patient is currently hemodynamically stable - Continue Protonix drip - Continue to monitor hemoglobin q12h. Transfuse per protocol. - Plan for EGD tomorrow to evaluate for possible etiology of bleeding, PUD, gastritis - Clear liquid diet - Discussed the above with the patient and family members present. Discussed the risks, including bleeding, infection, small perforation of esophagus, stomach, small intestine, or problems with anesthesia. Discussed the benefits as well. Patient and her family members agree to EGD tomorrow. - Dr. Alice Reichert examined patient  and agrees with above plan of care.  - All of the patient's questions were answered.  Recommendations: See above  Plan for EGD tomorrow with Dr. Alice Reichert  Thank you for the consult. Please call with questions or concerns.  Geanie Kenning, PA-C [place]

## 2017-08-25 NOTE — H&P (Signed)
[facility] NAME: Kristy Hardy    MR#:  [id number]  DATE OF BIRTH:  April 03, 1925  DATE OF ADMISSION:  08/25/2017  PRIMARY CARE PHYSICIAN: [name], MD   REQUESTING/REFERRING PHYSICIAN: Dr. Mable Paris  CHIEF COMPLAINT:  black tarry stools with blood mixed and dizziness  HISTORY OF PRESENT ILLNESS:  Kristy Hardy  is a 84 y.o. female with a known history of back pain, breast cancer, chronic anemia who was discharged 329 2019 after [facility] comes to the emergency room with dizziness hypotension blood pressure systolic in the 75T and melodramatic stools with blood mixed in it. His hemoglobin on 329 2019 was 10.8. Hemoglobin today is 7.8 the pressure is stable patient is being admitted for G.I. Bleed. She takes aspirin 81 mg daily. She is not on any NSAIDS.  PAST MEDICAL HISTORY:   Past Medical History:  Diagnosis Date  . Arthritis   . Breast cancer ([place])   . Cancer ([place]) 11-11-91   left breast   . Cancer ([place]) 08-12-14   right breast, ER/PR positive, Her2 negative  . Carcinoma of left breast ([place])    Left breast carcinoma; patient had lumpectomy radiation therapy   . COPD (chronic obstructive pulmonary disease) ([place])   . Heart valve problem    leaking  . History of colonoscopy 2012  . History of mammogram 2016  . Pneumonia July 2014    PAST SURGICAL HISTOIRY:   Past Surgical History:  Procedure Laterality Date  . BREAST BIOPSY Bilateral [id number]   Positive  . BREAST SURGERY Left 11-11-91   lumpectomy with radiation, Tamoxifen for 2 years.Dr. Sharlet Salina  . BREAST SURGERY Right 09/09/14   RIGHT BREAST WIDE EXCISION   . CHOLECYSTECTOMY  02/23/1972  . COLONOSCOPY    . KYPHOPLASTY N/A 08/17/2017   Procedure: [id number];  Surgeon: Hessie Knows, MD;  Location: [facility] ORS;  Service: Orthopedics;  Laterality: N/A;    SOCIAL HISTORY:   Social History   Tobacco Use  . Smoking status:  Never Smoker  . Smokeless tobacco: Never Used  Substance Use Topics  . Alcohol use: No    Alcohol/week: 0.0 oz    FAMILY HISTORY:   Family History  Problem Relation Age of Onset  . Colon cancer Mother 39  . Colon cancer Brother 34  . Breast cancer Sister 64    DRUG ALLERGIES:   Allergies  Allergen Reactions  . Amoxicillin-Pot Clavulanate Nausea And Vomiting and Other (See Comments)    Has patient had a PCN reaction causing immediate rash, facial/tongue/throat swelling, SOB or lightheadedness with hypotension: No Has patient had a PCN reaction causing severe rash involving mucus membranes or skin necrosis: No Has patient had a PCN reaction that required hospitalization: No Has patient had a PCN reaction occurring within the last 10 years: Unknown If all of the above answers are "NO", then may proceed with Cephalosporin use.   . Other Nausea And Vomiting  . Sulfa Antibiotics Rash    REVIEW OF SYSTEMS:  Review of Systems  Constitutional: Negative for chills, fever and weight loss.  HENT: Negative for ear discharge, ear pain and nosebleeds.   Eyes: Negative for blurred vision, pain and discharge.  Respiratory: Negative for sputum production, shortness of breath, wheezing and stridor.   Cardiovascular: Negative for chest pain, palpitations, orthopnea and PND.  Gastrointestinal: Positive for melena. Negative for abdominal pain, diarrhea, nausea and vomiting.  Genitourinary: Negative for frequency and urgency.  Musculoskeletal: Positive for back pain. Negative for joint pain.  Neurological: Positive for weakness. Negative for sensory change, speech change and focal weakness.  Psychiatric/Behavioral: Negative for depression and hallucinations. The patient is not nervous/anxious.      MEDICATIONS AT HOME:   Prior to Admission medications   Medication Sig Start Date End Date Taking? Authorizing Provider  aspirin 81 MG tablet Take 81 mg by mouth daily.   Yes [provider]  Cyanocobalamin (RA VITAMIN B-12 TR) 1000 MCG TBCR Take 1 tablet by mouth daily.   Yes [provider]  Ferrous Gluconate 325 (36 FE) MG TABS Take 1 tablet by mouth 2 (two) times daily. 02/03/15  Yes Choksi, Delorise Shiner, MD  fluticasone (FLONASE) 50 MCG/ACT nasal spray Place 1 spray into both nostrils 2 (two) times daily.  05/30/14  Yes [provider]  guaiFENesin (MUCINEX) 600 MG 12 hr tablet Take by mouth 2 (two) times daily as needed.   Yes [provider]  letrozole (FEMARA) 2.5 MG tablet TAKE 1 TABLET(2.5 MG) BY MOUTH DAILY 10/31/16  Yes Cammie Sickle, MD  LORazepam (ATIVAN) 1 MG tablet Take 1 tablet (1 mg total) by mouth at bedtime as needed for anxiety. 09/29/14  Yes Choksi, Delorise Shiner, MD  losartan-hydrochlorothiazide (HYZAAR) 50-12.5 MG per tablet Take 1 tablet by mouth daily.  05/21/14  Yes [provider]  Magnesium 250 MG TABS Take 1 tablet by mouth daily.    Yes [provider]  metoprolol succinate (TOPROL-XL) 50 MG 24 hr tablet Take 50 mg by mouth daily.  05/21/14  Yes [provider]  oxyCODONE-acetaminophen (PERCOCET/ROXICET) 5-325 MG tablet Take 1 tablet by mouth every 6 (six) hours as needed for severe pain.   Yes [provider]  PROAIR HFA 108 (90 Base) MCG/ACT inhaler Inhale 1 puff into the lungs every 6 (six) hours as needed for wheezing or shortness of breath.  05/31/16  Yes [provider]  pseudoephedrine-guaifenesin (MUCINEX D) 60-600 MG 12 hr tablet Take 1 tablet by mouth every 12 (twelve) hours as needed for congestion.   Yes [provider]  sennosides-docusate sodium (SENOKOT-S) 8.6-50 MG tablet Take 2 tablets by mouth daily.   Yes [provider]  traMADol (ULTRAM) 50 MG tablet Take 50 mg by mouth 2 (two) times daily.   Yes [provider]      VITAL SIGNS:  Blood pressure (!) 122/58, pulse 94, temperature (!) 97.5 F (36.4 C), temperature source Oral, resp. rate  18, height _0  (1.651 m), weight 77 kg (169 lb 11.2 oz), SpO2 97 %.  PHYSICAL EXAMINATION:  GENERAL:  84 y.o.-year-old patient lying in the bed with no acute distress.  EYES: Pupils equal, round, reactive to light and accommodation. No scleral icterus. Extraocular muscles intact.  HEENT: Head atraumatic, normocephalic. Oropharynx and nasopharynx clear.  NECK:  Supple, no jugular venous distention. No thyroid enlargement, no tenderness.  LUNGS: Normal breath sounds bilaterally, no wheezing, rales,rhonchi or crepitation. No use of accessory muscles of respiration.  CARDIOVASCULAR: S1, S2 normal. No murmurs, rubs, or gallops.  ABDOMEN: Soft, nontender, nondistended. Bowel sounds present. No organomegaly or mass.  EXTREMITIES: No pedal edema, cyanosis, or clubbing.  NEUROLOGIC: Cranial nerves II through XII are intact. Muscle strength 5/5 in all extremities. Sensation intact. Gait not checked.  PSYCHIATRIC: The patient is alert and oriented x 3.  SKIN: No obvious rash, lesion, or ulcer.   LABORATORY PANEL:   CBC Recent Labs  Lab 08/25/17 1356  WBC  13.2*  HGB 7.8*  HCT 24.7*  PLT 451*   ------------------------------------------------------------------------------------------------------------------  Chemistries  Recent Labs  Lab 08/25/17 1356  NA 135  K 3.4*  CL 98*  CO2 25  GLUCOSE 134*  BUN 39*  CREATININE 1.28*  CALCIUM 8.8*  AST 31  ALT 14  ALKPHOS 79  BILITOT 0.6   ------------------------------------------------------------------------------------------------------------------  Cardiac Enzymes Recent Labs  Lab 08/25/17 1356  TROPONINI 0.79*   ------------------------------------------------------------------------------------------------------------------  RADIOLOGY:  No results found.  EKG:    IMPRESSION AND PLAN:   Chosen Garron  is a 84 y.o. female with a known history of back pain, breast cancer, chronic anemia who was discharged 329 2019 after  [facility] comes to the emergency room with dizziness hypotension blood pressure systolic in the 19W and melodramatic stools with blood mixed in it. His hemoglobin on 329 2019 was 10.8. Hemoglobin today is 7.8  1. G.I. Bleed/Melena mixed with bright red blood -admit to medical floor -IV fluids, IV Protonix, G.I. Consultation-- with Dr. Alice Reichert -transfuse as needed -hemoglobin Q 12  2. elevated troponin in the setting of hypotension and anemia -does not have any coronary artery disease -has a history of mild aortic stenosis -cardiology consultation with Dr. Ubaldo Glassing -echo cardiac enzymes times three -aspirin on hold due to G.I. Bleed -EKG showed sinus rhythm with LVH changes. No acute ST elevation or depression. Patient denies chest pain  3. Back pain secondary to lumbar compression fracture status post hypoplastic last week -PRN pain meds  4. History of breast cancer  5. DVT prophylaxis SCD and Ted  discussed at length with son and daughter    All the records are reviewed and case discussed with ED provider. Management plans discussed with the patient, family and they are in agreement.  CODE STATUS: full  TOTAL TIME TAKING CARE OF THIS PATIENT: 21mnutes.    SFritzi MandesM.D on 08/25/2017 at 3:01 PM  Between 7am to 6pm - Pager - [phone]  After 6pm go to [email or web address] - password [email or web address] [facility]  Office  [phone] CC: Primary care physician; HMaryland Pink MD

## 2017-08-25 NOTE — NC FL2 (Signed)
[place] LEVEL OF CARE SCREENING TOOL     IDENTIFICATION  Patient Name: Kristy Hardy Birthdate: 03-12-1925 Sex: female Admission Date (Current Location): 08/25/2017  [place] and [place] Number:  Engineering geologist and Address:  [facility], [address] [place],  [address]      Provider Number: [id number]  Attending Physician Name and Address:  Fritzi Mandes, MD  Relative Name and Phone Number:       Current Level of Care: Hospital Recommended Level of Care: [place] Prior Approval Number:    Date Approved/Denied:   PASRR Number: [id number]  Discharge Plan: SNF    Current Diagnoses: Patient Active Problem List   Diagnosis Date Noted  . GI bleed 08/25/2017  . Lumbar compression fracture ([place]) 08/16/2017  . Carcinoma of upper-inner quadrant of right breast in female, estrogen receptor positive ([place]) 04/01/2016  . History of breast cancer 08/10/2015  . Iron deficiency anemia 02/03/2015  . Carcinoma of right breast ([place]) 10/12/2014  . Malignant neoplasm of hepatic flexure of colon ([place]) 09/29/2014  . Aortic heart valve narrowing 03/31/2014  . Benign neoplasm of colon 08/28/2013  . HLD (hyperlipidemia) 08/28/2013  . Hypercholesterolemia without hypertriglyceridemia 08/28/2013    Orientation RESPIRATION BLADDER Height & Weight     Self, Time, Situation, Place    Incontinent Weight: 169 lb 11.2 oz (77 kg) Height:  5\' 5"  (165.1 cm)  BEHAVIORAL SYMPTOMS/MOOD NEUROLOGICAL BOWEL NUTRITION STATUS      Continent    AMBULATORY STATUS COMMUNICATION OF NEEDS Skin   Extensive Assist Verbally Normal                       Personal Care Assistance Level of Assistance  Bathing, Feeding, Dressing Bathing Assistance: Limited assistance Feeding assistance: Independent Dressing Assistance: Limited assistance     Functional Limitations Info  Sight, Hearing, Speech Sight Info: Adequate Hearing  Info: Adequate Speech Info: Adequate    SPECIAL CARE FACTORS FREQUENCY  PT (By licensed PT), OT (By licensed OT)     PT Frequency: x5 OT Frequency: x5            Contractures Contractures Info: Not present    Additional Factors Info  Code Status, Allergies Code Status Info: full code Allergies Info: amoxiccillian-pot clavulanate- sulpha antibiotics           Current Medications (08/25/2017):  This is the current hospital active medication list No current facility-administered medications for this encounter.    Current Outpatient Medications  Medication Sig Dispense Refill  . aspirin 81 MG tablet Take 81 mg by mouth daily.    . Cyanocobalamin (RA VITAMIN B-12 TR) 1000 MCG TBCR Take 1 tablet by mouth daily.    . Ferrous Gluconate 325 (36 FE) MG TABS Take 1 tablet by mouth 2 (two) times daily. 60 tablet 3  . fluticasone (FLONASE) 50 MCG/ACT nasal spray Place 1 spray into both nostrils 2 (two) times daily.   11  . guaiFENesin (MUCINEX) 600 MG 12 hr tablet Take by mouth 2 (two) times daily as needed.    Marland Kitchen letrozole (FEMARA) 2.5 MG tablet TAKE 1 TABLET(2.5 MG) BY MOUTH DAILY 270 tablet 0  . LORazepam (ATIVAN) 1 MG tablet Take 1 tablet (1 mg total) by mouth at bedtime as needed for anxiety. 30 tablet 3  . losartan-hydrochlorothiazide (HYZAAR) 50-12.5 MG per tablet Take 1 tablet by mouth daily.     . Magnesium 250 MG TABS Take 1  tablet by mouth daily.     . metoprolol succinate (TOPROL-XL) 50 MG 24 hr tablet Take 50 mg by mouth daily.     Marland Kitchen oxyCODONE-acetaminophen (PERCOCET/ROXICET) 5-325 MG tablet Take 1 tablet by mouth every 6 (six) hours as needed for severe pain.    Marland Kitchen PROAIR HFA 108 (90 Base) MCG/ACT inhaler Inhale 1 puff into the lungs every 6 (six) hours as needed for wheezing or shortness of breath.     . pseudoephedrine-guaifenesin (MUCINEX D) 60-600 MG 12 hr tablet Take 1 tablet by mouth every 12 (twelve) hours as needed for congestion.    . sennosides-docusate sodium  (SENOKOT-S) 8.6-50 MG tablet Take 2 tablets by mouth daily.    . traMADol (ULTRAM) 50 MG tablet Take 50 mg by mouth 2 (two) times daily.       Discharge Medications: Please see discharge summary for a list of discharge medications.  Relevant Imaging Results:  Relevant Lab Results:   Additional Information  [id number]  Joana Reamer,

## 2017-08-25 NOTE — ED Provider Notes (Signed)
[facility] Emergency Department Provider Note  ____________________________________________   First MD Initiated Contact with Patient 08/25/17 1324     (approximate)  I have reviewed the triage vital signs and the nursing notes.   HISTORY  Chief Complaint Weakness and Near Syncope   HPI Kristy Hardy is a 84 y.o. female who comes to the emergency department via EMS with weakness near syncope and black stool that began today at her rehabilitation.  She is in rehab for an L1 compression fracture.  She has been improving steadily however this morning when she stood up she felt lightheaded and nearly passed out.  Her physical therapist checked her blood pressure and it was 70/40.  The patient has noted some black stools for the past day or so.  She denies history of GI bleed although reports a history of gastritis.  She takes no aspirin, Plavix, her blood thinning medication.  No chest pain or shortness of breath.   Past Medical History:  Diagnosis Date  . Arthritis   . Breast cancer ([place])   . Cancer ([place]) 11-11-91   left breast   . Cancer ([place]) 08-12-14   right breast, ER/PR positive, Her2 negative  . Carcinoma of left breast ([place])    Left breast carcinoma; patient had lumpectomy radiation therapy   . COPD (chronic obstructive pulmonary disease) ([place])   . Heart valve problem    leaking  . History of colonoscopy 2012  . History of mammogram 2016  . Pneumonia July 2014    Patient Active Problem List   Diagnosis Date Noted  . GI bleed 08/25/2017  . Lumbar compression fracture ([place]) 08/16/2017  . Carcinoma of upper-inner quadrant of right breast in female, estrogen receptor positive ([place]) 04/01/2016  . History of breast cancer 08/10/2015  . Iron deficiency anemia 02/03/2015  . Carcinoma of right breast ([place]) 10/12/2014  . Malignant neoplasm of hepatic flexure of colon ([place]) 09/29/2014  . Aortic heart valve narrowing 03/31/2014  . Benign  neoplasm of colon 08/28/2013  . HLD (hyperlipidemia) 08/28/2013  . Hypercholesterolemia without hypertriglyceridemia 08/28/2013    Past Surgical History:  Procedure Laterality Date  . BREAST BIOPSY Bilateral [id number]   Positive  . BREAST SURGERY Left 11-11-91   lumpectomy with radiation, Tamoxifen for 2 years.Dr. Sharlet Salina  . BREAST SURGERY Right 09/09/14   RIGHT BREAST WIDE EXCISION   . CHOLECYSTECTOMY  02/23/1972  . COLONOSCOPY    . KYPHOPLASTY N/A 08/17/2017   Procedure: [id number];  Surgeon: Hessie Knows, MD;  Location: [facility] ORS;  Service: Orthopedics;  Laterality: N/A;    Prior to Admission medications   Medication Sig Start Date End Date Taking? Authorizing Provider  aspirin 81 MG tablet Take 81 mg by mouth daily.   Yes [provider]  Cyanocobalamin (RA VITAMIN B-12 TR) 1000 MCG TBCR Take 1 tablet by mouth daily.   Yes [provider]  Ferrous Gluconate 325 (36 FE) MG TABS Take 1 tablet by mouth 2 (two) times daily. 02/03/15  Yes Choksi, Delorise Shiner, MD  fluticasone (FLONASE) 50 MCG/ACT nasal spray Place 1 spray into both nostrils 2 (two) times daily.  05/30/14  Yes [provider]  guaiFENesin (MUCINEX) 600 MG 12 hr tablet Take by mouth 2 (two) times daily as needed.   Yes [provider]  letrozole (FEMARA) 2.5 MG tablet TAKE 1 TABLET(2.5 MG) BY MOUTH DAILY 10/31/16  Yes Cammie Sickle, MD  LORazepam (ATIVAN) 1 MG tablet Take 1 tablet (1 mg total)  by mouth at bedtime as needed for anxiety. 09/29/14  Yes Choksi, Delorise Shiner, MD  losartan-hydrochlorothiazide (HYZAAR) 50-12.5 MG per tablet Take 1 tablet by mouth daily.  05/21/14  Yes [provider]  Magnesium 250 MG TABS Take 1 tablet by mouth daily.    Yes [provider]  metoprolol succinate (TOPROL-XL) 50 MG 24 hr tablet Take 50 mg by mouth daily.  05/21/14  Yes [provider]  oxyCODONE-acetaminophen (PERCOCET/ROXICET) 5-325 MG tablet Take 1 tablet by mouth every 6  (six) hours as needed for severe pain.   Yes [provider]  PROAIR HFA 108 (90 Base) MCG/ACT inhaler Inhale 1 puff into the lungs every 6 (six) hours as needed for wheezing or shortness of breath.  05/31/16  Yes [provider]  pseudoephedrine-guaifenesin (MUCINEX D) 60-600 MG 12 hr tablet Take 1 tablet by mouth every 12 (twelve) hours as needed for congestion.   Yes [provider]  sennosides-docusate sodium (SENOKOT-S) 8.6-50 MG tablet Take 2 tablets by mouth daily.   Yes [provider]  traMADol (ULTRAM) 50 MG tablet Take 50 mg by mouth 2 (two) times daily.   Yes [provider]    Allergies Amoxicillin-pot clavulanate; Other; and Sulfa antibiotics  Family History  Problem Relation Age of Onset  . Colon cancer Mother 90  . Colon cancer Brother 68  . Breast cancer Sister 28    Social History Social History   Tobacco Use  . Smoking status: Never Smoker  . Smokeless tobacco: Never Used  Substance Use Topics  . Alcohol use: No    Alcohol/week: 0.0 oz  . Drug use: No    Review of Systems Constitutional: No fever/chills Eyes: No visual changes. ENT: No sore throat. Cardiovascular: Denies chest pain. Respiratory: Denies shortness of breath. Gastrointestinal: No abdominal pain.  Positive for nausea, no vomiting.  Positive for diarrhea.  No constipation. Genitourinary: Negative for dysuria. Musculoskeletal: Negative for back pain. Skin: Negative for rash. Neurological: Negative for headaches, focal weakness or numbness.   ____________________________________________   PHYSICAL EXAM:  VITAL SIGNS: ED Triage Vitals  Enc Vitals Group     BP      Pulse      Resp      Temp      Temp src      SpO2      Weight      Height      Head Circumference      Peak Flow      Pain Score      Pain Loc      Pain Edu?      Excl. in [place]?     Constitutional: Alert and oriented x4 appears mildly uncomfortable nontoxic no diaphoresis  speaks in full clear sentences Eyes: PERRL EOMI. Head: Atraumatic. Nose: No congestion/rhinnorhea. Mouth/Throat: No trismus Neck: No stridor.   Cardiovascular: Normal rate, regular rhythm. Grossly normal heart sounds.  Good peripheral circulation. Respiratory: Normal respiratory effort.  No retractions. Lungs CTAB and moving good air Gastrointestinal: Soft mild diffuse tenderness no rebound no guarding no peritonitis Guaiac positive control positive frank melena Musculoskeletal: No lower extremity edema   Neurologic:  Normal speech and language. No gross focal neurologic deficits are appreciated. Skin:  Skin is warm, dry and intact. No rash noted. Psychiatric: Mood and affect are normal. Speech and behavior are normal.    ____________________________________________   DIFFERENTIAL includes but not limited to  Upper GI bleed, lower GI bleed, sepsis, myocardial infarction ____________________________________________  LABS (all labs ordered are listed, but only abnormal results are displayed)  Labs Reviewed  COMPREHENSIVE METABOLIC PANEL - Abnormal; Notable for the following components:      Result Value   Potassium 3.4 (*)    Chloride 98 (*)    Glucose, Bld 134 (*)    BUN 39 (*)    Creatinine, Ser 1.28 (*)    Calcium 8.8 (*)    Albumin 3.4 (*)    GFR calc non Af Amer 36 (*)    GFR calc Af Amer 41 (*)    All other components within normal limits  CBC WITH DIFFERENTIAL/PLATELET - Abnormal; Notable for the following components:   WBC 13.2 (*)    RBC 2.97 (*)    Hemoglobin 7.8 (*)    HCT 24.7 (*)    MCHC 31.7 (*)    RDW 16.3 (*)    Platelets 451 (*)    Neutro Abs 10.4 (*)    Monocytes Absolute 1.6 (*)    All other components within normal limits  TROPONIN I - Abnormal; Notable for the following components:   Troponin I 0.79 (*)    All other components within normal limits  URINALYSIS, COMPLETE (UACMP) WITH MICROSCOPIC  TROPONIN I  TROPONIN I  TROPONIN I    Lab  work reviewed by me with acute drop in hemoglobin.  Elevated troponin likely secondary to demand not true primary myocardial ischemia __________________________________________  EKG  ED ECG REPORT I, Darel Hong, the attending physician, personally viewed and interpreted this ECG.  Date: 08/25/2017 EKG Time:  Rate: 90 Rhythm: normal sinus rhythm QRS Axis: Leftward axis Intervals: Prolonged QTC ST/T Wave abnormalities: normal Narrative Interpretation: no evidence of acute ischemia  ____________________________________________  RADIOLOGY   ____________________________________________   PROCEDURES  Procedure(s) performed: no  .Critical Care Performed by: Darel Hong, MD Authorized by: Darel Hong, MD   Critical care provider statement:    Critical care time (minutes):  30   Critical care time was exclusive of:  Separately billable procedures and treating other patients   Critical care was necessary to treat or prevent imminent or life-threatening deterioration of the following conditions:  Shock   Critical care was time spent personally by me on the following activities:  Development of treatment plan with patient or surrogate, discussions with consultants, evaluation of patient's response to treatment, examination of patient, obtaining history from patient or surrogate, ordering and performing treatments and interventions, ordering and review of laboratory studies, ordering and review of radiographic studies, pulse oximetry, re-evaluation of patient's condition and review of old charts    Critical Care performed: Yes  Observation: no ____________________________________________   INITIAL IMPRESSION / ASSESSMENT AND PLAN / ED COURSE  Pertinent labs & imaging results that were available during my care of the patient were reviewed by me and considered in my medical decision making (see chart for details).  The patient arrives hemodynamically stable however  she was hypotensive prehospital to 70/40 with melanotic sounding stools.  She is diffusely tender across her upper abdomen.  Rectal exam shows frank melena that is highly guaiac positive.  Patient does have an acute drop in her hemoglobin although I do not think she needs an emergent transfusion.  Protonix for now and given her elevated troponin, near syncope, and acute GI hemorrhage I do believe she will requires inpatient admission.  I discussed with the patient who verbalized understanding agree with the plan.  I then discussed with the hospitalist who has graciously agreed to  admit the patient to her service.      ____________________________________________   FINAL CLINICAL IMPRESSION(S) / ED DIAGNOSES  Final diagnoses:  Gastrointestinal hemorrhage, unspecified gastrointestinal hemorrhage type  Other iron deficiency anemia      NEW MEDICATIONS STARTED DURING THIS VISIT:  New Prescriptions   No medications on file     Note:  This document was prepared using Dragon voice recognition software and may include unintentional dictation errors.     Darel Hong, MD 08/25/17 [phone]

## 2017-08-25 NOTE — Consult Note (Signed)
GI Inpatient Consult Note  Reason for Consult: Melena/GIB   Attending Requesting Consult: Dr. Sona Patel  History of Present Illness: Kristy Hardy is a 84 y.o. female seen for evaluation of melena at the request of Dr. Sona Patel. Pt was recently discharged from hospital 08/18/2017 2/2 vertebral compression fracture s/p kyphoplasty. Pt came to ED today with dizziness, SBP in 70s, and melena. Hemoglobin today was 7.8, at discharge last week was 10.8. Patient states she has noticed black, tarry stools since Wednesday. She endorses two episodes of melena this morning, which prompted coming to the ED. She denies any abdominal pain, nausea, vomiting, dysphagia, GERD, rectal pain/burning. She denies frequent NSAID use. Pt not on any blood thinners. Denies bismuth/iron use. States she was on iron therapy in the past, not currently taking. She does take a baby aspirin daily. Pt states her mother had colon cancer. No hx of GI bleeds, gastric/duodenal ulcers.   Patient seen and examined bedside in no acute distress. Patient states she is more tired than at her baseline, but denies any abdominal pain. She is accompanied by two family members bedside who help with the history. Patient states she sees Dr. Fath for her cardiology issues.    Last Colonoscopy: Not seen in EMR, per daughter "about 10 years ago" Last Endoscopy: Patient denies   Past Medical History:  Past Medical History:  Diagnosis Date  . Arthritis   . Breast cancer ([facility])   . Cancer ([facility]) 11-11-91   left breast   . Cancer ([facility]) 08-12-14   right breast, ER/PR positive, Her2 negative  . Carcinoma of left breast ([facility])    Left breast carcinoma; patient had lumpectomy radiation therapy   . COPD (chronic obstructive pulmonary disease) ([facility])   . Heart valve problem    leaking  . History of colonoscopy 2012  . History of mammogram 2016  . Pneumonia July 2014    Problem List: Patient Active Problem List   Diagnosis Date Noted  .  GI bleed 08/25/2017  . Lumbar compression fracture ([facility]) 08/16/2017  . Carcinoma of upper-inner quadrant of right breast in female, estrogen receptor positive ([facility]) 04/01/2016  . History of breast cancer 08/10/2015  . Iron deficiency anemia 02/03/2015  . Carcinoma of right breast ([facility]) 10/12/2014  . Malignant neoplasm of hepatic flexure of colon ([facility]) 09/29/2014  . Aortic heart valve narrowing 03/31/2014  . Benign neoplasm of colon 08/28/2013  . HLD (hyperlipidemia) 08/28/2013  . Hypercholesterolemia without hypertriglyceridemia 08/28/2013    Past Surgical History: Past Surgical History:  Procedure Laterality Date  . BREAST BIOPSY Bilateral 1993,2016   Positive  . BREAST SURGERY Left 11-11-91   lumpectomy with radiation, Tamoxifen for 2 years.Dr. Crawford  . BREAST SURGERY Right 09/09/14   RIGHT BREAST WIDE EXCISION   . CHOLECYSTECTOMY  02/23/1972  . COLONOSCOPY    . KYPHOPLASTY N/A 08/17/2017   Procedure: KYPHOPLASTY-L1;  Surgeon: Menz, Michael, MD;  Location: [facility] ORS;  Service: Orthopedics;  Laterality: N/A;    Allergies: Allergies  Allergen Reactions  . Amoxicillin-Pot Clavulanate Nausea And Vomiting and Other (See Comments)    Has patient had a PCN reaction causing immediate rash, facial/tongue/throat swelling, SOB or lightheadedness with hypotension: No Has patient had a PCN reaction causing severe rash involving mucus membranes or skin necrosis: No Has patient had a PCN reaction that required hospitalization: No Has patient had a PCN reaction occurring within the last 10 years: Unknown If all of the above answers are "NO", then may proceed   with Cephalosporin use.   . Other Nausea And Vomiting  . Sulfa Antibiotics Rash    Home Medications:  (Not in a hospital admission) Home medication reconciliation was completed with the patient.   Scheduled Inpatient Medications:    Continuous Inpatient Infusions:    PRN Inpatient Medications:    Family History: family  history includes Breast cancer (age of onset: 32) in her sister; Colon cancer (age of onset: 68) in her mother; Colon cancer (age of onset: 75) in her brother.  The patient's family history is negative for inflammatory bowel disorders, GI malignancy, or solid organ transplantation.  Social History:   reports that she has never smoked. She has never used smokeless tobacco. She reports that she does not drink alcohol or use drugs. The patient denies ETOH, tobacco, or drug use.   Review of Systems: Constitutional: Weight is stable.  Eyes: No changes in vision. ENT: No oral lesions, sore throat.  GI: see HPI.  Heme/Lymph: No easy bruising.  CV: No chest pain.  GU: No hematuria.  Integumentary: No rashes.  Neuro: No headaches.  Psych: No depression/anxiety.  Endocrine: No heat/cold intolerance.  Allergic/Immunologic: No urticaria.  Resp: No cough, SOB.  Musculoskeletal: No joint swelling.    Physical Examination: BP (!) 122/58 (BP Location: Right Arm)   Pulse 94   Temp (!) 97.5 F (36.4 C) (Oral)   Resp 18   Ht 5' 5" (1.651 m)   Wt 77 kg (169 lb 11.2 oz)   SpO2 97%   BMI 28.24 kg/m   Non-toxic appearing elderly female in hospital bed. Answers all questions appropriately. Accompanied by daughter who helps answer questions.  Gen: NAD, alert and oriented x 4 HEENT: PEERLA, EOMI, Neck: supple, no JVD or thyromegaly Chest: CTA bilaterally, no wheezes, crackles, or other adventitious sounds CV: RRR, no m/g/c/r Abd: soft, NT, ND, +BS in all four quadrants; no HSM, guarding, ridigity, or rebound tenderness Ext: no edema, well perfused with 2+ pulses, Skin: no rash or lesions noted Lymph: no LAD  Data: Lab Results  Component Value Date   WBC 13.2 (H) 08/25/2017   HGB 7.8 (L) 08/25/2017   HCT 24.7 (L) 08/25/2017   MCV 83.0 08/25/2017   PLT 451 (H) 08/25/2017   Recent Labs  Lab 08/25/17 1356  HGB 7.8*   Lab Results  Component Value Date   NA 135 08/25/2017   K 3.4 (L)  08/25/2017   CL 98 (L) 08/25/2017   CO2 25 08/25/2017   BUN 39 (H) 08/25/2017   CREATININE 1.28 (H) 08/25/2017   Lab Results  Component Value Date   ALT 14 08/25/2017   AST 31 08/25/2017   ALKPHOS 79 08/25/2017   BILITOT 0.6 08/25/2017   No results for input(s): APTT, INR, PTT in the last 168 hours. Assessment/Plan:  Ms. Wilhide is a 84 y/o Caucasian female with a PMH of moderate aortic stenosis, HTN, and hyperlipidemia consulted for melena/anemia.  1. Melena: - Patient not on anticoagulation/blood thinners, infrequent NSAID use - Patient is currently hemodynamically stable - Continue Protonix drip - Continue to monitor hemoglobin q12h. Transfuse per protocol. - Plan for EGD tomorrow to evaluate for possible etiology of bleeding, PUD, gastritis - Clear liquid diet - Discussed the above with the patient and family members present. Discussed the risks, including bleeding, infection, small perforation of esophagus, stomach, small intestine, or problems with anesthesia. Discussed the benefits as well. Patient and her family members agree to EGD tomorrow. - Dr. Toledo examined patient   and agrees with above plan of care.  - All of the patient's questions were answered.  Recommendations: See above  Plan for EGD tomorrow with Dr. Alice Reichert  Thank you for the consult. Please call with questions or concerns.  Geanie Kenning, PA-C [place]

## 2017-08-26 ENCOUNTER — Inpatient Hospital Stay: Payer: Medicare Other | Admitting: Anesthesiology

## 2017-08-26 ENCOUNTER — Ambulatory Visit: Admission: RE | Admit: 2017-08-26 | Payer: Medicare Other | Source: Ambulatory Visit | Admitting: Internal Medicine

## 2017-08-26 ENCOUNTER — Encounter
Admission: EM | Disposition: A | Discharge status: Home / Self Care | Payer: Self-pay | Source: Home / Self Care | Attending: Internal Medicine

## 2017-08-26 HISTORY — PX: ESOPHAGOGASTRODUODENOSCOPY (EGD) WITH PROPOFOL: SHX5813

## 2017-08-26 LAB — CBC
HEMATOCRIT: 20.8 % — AB (ref 35.0–47.0)
Hemoglobin: 6.7 g/dL — ABNORMAL LOW (ref 12.0–16.0)
MCH: 26.2 pg (ref 26.0–34.0)
MCHC: 32.3 g/dL (ref 32.0–36.0)
MCV: 81.2 fL (ref 80.0–100.0)
Platelets: 467 10*3/uL — ABNORMAL HIGH (ref 150–440)
RBC: 2.56 MIL/uL — ABNORMAL LOW (ref 3.80–5.20)
RDW: 16.9 % — AB (ref 11.5–14.5)
WBC: 9.6 10*3/uL (ref 3.6–11.0)

## 2017-08-26 LAB — ABO/RH: ABO/RH(D): O POS

## 2017-08-26 LAB — MAGNESIUM: MAGNESIUM: 2 mg/dL (ref 1.7–2.4)

## 2017-08-26 LAB — BASIC METABOLIC PANEL
Anion gap: 9 (ref 5–15)
BUN: 31 mg/dL — ABNORMAL HIGH (ref 6–20)
CALCIUM: 8.6 mg/dL — AB (ref 8.9–10.3)
CO2: 23 mmol/L (ref 22–32)
Chloride: 104 mmol/L (ref 101–111)
Creatinine, Ser: 1.1 mg/dL — ABNORMAL HIGH (ref 0.44–1.00)
GFR, EST AFRICAN AMERICAN: 50 mL/min — AB (ref >60)
GFR, EST NON AFRICAN AMERICAN: 43 mL/min — AB (ref >60)
GLUCOSE: 102 mg/dL — AB (ref 65–99)
Potassium: 4.3 mmol/L (ref 3.5–5.1)
SODIUM: 136 mmol/L (ref 135–145)

## 2017-08-26 LAB — HEMOGLOBIN AND HEMATOCRIT, BLOOD
HEMATOCRIT: 24.2 % — AB (ref 35.0–47.0)
Hemoglobin: 7.9 g/dL — ABNORMAL LOW (ref 12.0–16.0)

## 2017-08-26 LAB — TROPONIN I: Troponin I: 1.04 ng/mL (ref <0.03)

## 2017-08-26 SURGERY — ESOPHAGOGASTRODUODENOSCOPY (EGD) WITH PROPOFOL
Anesthesia: General

## 2017-08-26 MED ORDER — METOPROLOL SUCCINATE ER 50 MG PO TB24
50.0000 mg | ORAL_TABLET | Freq: Every day | ORAL | Status: DC
  Administered 2017-08-26: 50 mg via ORAL
  Filled 2017-08-26: qty 1

## 2017-08-26 MED ORDER — DIGOXIN 0.25 MG/ML IJ SOLN
0.1250 mg | INTRAMUSCULAR | Status: AC
  Administered 2017-08-26: 0.125 mg via INTRAVENOUS
  Filled 2017-08-26: qty 0.5

## 2017-08-26 MED ORDER — AMIODARONE HCL IN DEXTROSE 360-4.14 MG/200ML-% IV SOLN
60.0000 mg/h | INTRAVENOUS | Status: AC
  Administered 2017-08-26: 60 mg/h via INTRAVENOUS
  Filled 2017-08-26 (×2): qty 200

## 2017-08-26 MED ORDER — SODIUM CHLORIDE 0.9 % IV SOLN
INTRAVENOUS | Status: DC | PRN
  Administered 2017-08-26: 12:00:00 via INTRAVENOUS

## 2017-08-26 MED ORDER — PROPOFOL 500 MG/50ML IV EMUL
INTRAVENOUS | Status: DC | PRN
  Administered 2017-08-26: 150 ug/kg/min via INTRAVENOUS

## 2017-08-26 MED ORDER — PEG 3350-KCL-NA BICARB-NACL 420 G PO SOLR
4000.0000 mL | Freq: Once | ORAL | Status: AC
  Administered 2017-08-26: 18:00:00 4000 mL via ORAL
  Filled 2017-08-26: qty 4000

## 2017-08-26 MED ORDER — HYDROCHLOROTHIAZIDE 12.5 MG PO CAPS: 12.5000 mg | ORAL_CAPSULE | Freq: Every day | ORAL | Status: DC

## 2017-08-26 MED ORDER — PANTOPRAZOLE SODIUM 40 MG IV SOLR
40.0000 mg | Freq: Two times a day (BID) | INTRAVENOUS | Status: DC
  Administered 2017-08-26 – 2017-08-28 (×4): 40 mg via INTRAVENOUS
  Filled 2017-08-26 (×4): qty 40

## 2017-08-26 MED ORDER — ESMOLOL HCL 100 MG/10ML IV SOLN
INTRAVENOUS | Status: AC
  Filled 2017-08-26: qty 10

## 2017-08-26 MED ORDER — AMIODARONE HCL IN DEXTROSE 360-4.14 MG/200ML-% IV SOLN
30.0000 mg/h | INTRAVENOUS | Status: DC
Start: 2017-08-26 — End: 2017-08-27
  Administered 2017-08-26 – 2017-08-27 (×2): 30 mg/h via INTRAVENOUS
  Filled 2017-08-26 (×3): qty 200

## 2017-08-26 MED ORDER — LIDOCAINE HCL (CARDIAC) 20 MG/ML IV SOLN
INTRAVENOUS | Status: DC | PRN
  Administered 2017-08-26: 100 mg via INTRAVENOUS

## 2017-08-26 MED ORDER — METOPROLOL TARTRATE 5 MG/5ML IV SOLN
2.5000 mg | Freq: Once | INTRAVENOUS | Status: AC
  Administered 2017-08-26: 2.5 mg via INTRAVENOUS
  Filled 2017-08-26: qty 5

## 2017-08-26 MED ORDER — PHENYLEPHRINE HCL 10 MG/ML IJ SOLN
INTRAMUSCULAR | Status: DC | PRN
  Administered 2017-08-26: 100 ug via INTRAVENOUS
  Administered 2017-08-26: 200 ug via INTRAVENOUS
  Administered 2017-08-26: 100 ug via INTRAVENOUS
  Administered 2017-08-26: 200 ug via INTRAVENOUS
  Administered 2017-08-26 (×4): 100 ug via INTRAVENOUS

## 2017-08-26 MED ORDER — LOSARTAN POTASSIUM 50 MG PO TABS: 50.0000 mg | ORAL_TABLET | Freq: Every day | ORAL | Status: DC

## 2017-08-26 MED ORDER — DIGOXIN 0.25 MG/ML IJ SOLN
0.2500 mg | INTRAMUSCULAR | Status: AC
  Administered 2017-08-26: 15:00:00 0.25 mg via INTRAVENOUS
  Filled 2017-08-26: qty 1

## 2017-08-26 MED ORDER — SODIUM CHLORIDE 0.9 % IV SOLN
Freq: Once | INTRAVENOUS | Status: AC
  Administered 2017-08-27: 09:00:00 via INTRAVENOUS

## 2017-08-26 MED ORDER — LOSARTAN POTASSIUM-HCTZ 50-12.5 MG PO TABS: 1.0000 | ORAL_TABLET | Freq: Every day | ORAL | Status: DC

## 2017-08-26 MED ORDER — AMIODARONE LOAD VIA INFUSION
150.0000 mg | Freq: Once | INTRAVENOUS | Status: AC
  Administered 2017-08-26: 18:00:00 150 mg via INTRAVENOUS
  Filled 2017-08-26: qty 83.34

## 2017-08-26 MED ORDER — BISACODYL 5 MG PO TBEC
10.0000 mg | DELAYED_RELEASE_TABLET | Freq: Once | ORAL | Status: AC
  Administered 2017-08-26: 10 mg via ORAL
  Filled 2017-08-26: qty 2

## 2017-08-26 MED ORDER — ESMOLOL HCL 100 MG/10ML IV SOLN
INTRAVENOUS | Status: DC | PRN
  Administered 2017-08-26: 20 mg via INTRAVENOUS
  Administered 2017-08-26: 10 mg via INTRAVENOUS
  Administered 2017-08-26: 30 mg via INTRAVENOUS
  Administered 2017-08-26: 10 mg via INTRAVENOUS

## 2017-08-26 MED ORDER — PROPOFOL 10 MG/ML IV BOLUS
INTRAVENOUS | Status: DC | PRN
  Administered 2017-08-26: 30 mg via INTRAVENOUS
  Administered 2017-08-26: 50 mg via INTRAVENOUS

## 2017-08-26 NOTE — Clinical Social Work Note (Signed)
CSW received consult for nursing home placement. The patient has already been assessed by the [facility]. PT is pending to determine if the patient will return to [facility] or home with home health. CSW is following.  Santiago Bumpers, MSW, Latanya Presser [phone]

## 2017-08-26 NOTE — OR Nursing (Signed)
Pt on arrival in af with rate of 150. DRPendwarden medicate pt and and told dr Alice Reichert to procedure.

## 2017-08-26 NOTE — Transfer of Care (Addendum)
Immediate Anesthesia Transfer of Care Note  Patient: Kristy Hardy  Procedure(s) Performed: ESOPHAGOGASTRODUODENOSCOPY (EGD) WITH PROPOFOL (N/A )  Patient Location: PACU  Anesthesia Type:MAC  Level of Consciousness: drowsy and patient cooperative  Airway & Oxygen Therapy: Patient Spontanous Breathing and Patient connected to nasal cannula oxygen  Post-op Assessment: Report given to RN, Post -op Vital signs reviewed and stable and Patient moving all extremities  Post vital signs: stable  Last Vitals:  Vitals2 Value Taken Time  BP 111/64 08/26/2017  1:07 PM  Temp 36.5 C 08/26/2017  1:03 PM  Pulse 43 08/26/2017  1:08 PM  Resp 23 08/26/2017  1:08 PM  SpO2 97 % 08/26/2017  1:08 PM  Vitals shown include unvalidated device data.  Last Pain:  Vitals:   08/26/17 1303  TempSrc:   PainSc: Asleep         Complications: No apparent anesthesia complications

## 2017-08-26 NOTE — Progress Notes (Signed)
Latest troponin  Noted to be at 1.04. Paged MD on call regarding the increasing trend. No new orders made.

## 2017-08-26 NOTE — Progress Notes (Signed)
Patient Name: Kristy Hardy Date of Encounter: 08/26/2017  Hospital Problem List     Active Problems:   GI bleed    Patient Profile     84 year old female with history of GERD aortic stenosis admitted with GI bleed.  Subjective   Stable this morning however hemoglobin is dropped further.  Obvious source of bleeding.  Inpatient Medications    . ferrous gluconate  324 mg Oral BID  . fluticasone  1 spray Each Nare BID  . letrozole  2.5 mg Oral Daily  . magnesium oxide  200 mg Oral Daily  . pantoprazole (PROTONIX) IV  40 mg Intravenous Q12H  . traMADol  50 mg Oral BID  . vitamin B-12  1,000 mcg Oral Daily    Vital Signs    Vitals:   08/25/17 1330 08/25/17 1900 08/25/17 2019 08/26/17 0403  BP:  99/81 (!) 130/57 (!) 139/57  Pulse:  78 81 73  Resp:  19 18 18   Temp:   97.9 F (36.6 C) 97.8 F (36.6 C)  TempSrc:   Oral Oral  SpO2:  97% 99% 95%  Weight: 77 kg (169 lb 11.2 oz)  70.9 kg (156 lb 4.9 oz)   Height: 5\' 5"  (1.651 m)  5\' 5"  (1.651 m)     Intake/Output Summary (Last 24 hours) at 08/26/2017 0934 Last data filed at 08/26/2017 0600 Gross per 24 hour  Intake 54.67 ml  Output -  Net 54.67 ml   Filed Weights   08/25/17 1330 08/25/17 2019  Weight: 77 kg (169 lb 11.2 oz) 70.9 kg (156 lb 4.9 oz)    Physical Exam    GEN: Well nourished, well developed, in no acute distress.  HEENT: normal.  Neck: Supple, no JVD, carotid bruits, or masses. Cardiac: RRR, no murmurs, rubs, or gallops. No clubbing, cyanosis, edema.  Radials/DP/PT 2+ and equal bilaterally.  Respiratory:  Respirations regular and unlabored, clear to auscultation bilaterally. GI: Soft, nontender, nondistended, BS + x 4. MS: no deformity or atrophy. Skin: warm and dry, no rash. Neuro:  Strength and sensation are intact. Psych: Normal affect.  Labs    CBC Recent Labs    08/25/17 1356 08/26/17 0444  WBC 13.2* 9.6  NEUTROABS 10.4*  --   HGB 7.8* 6.7*  HCT 24.7* 20.8*  MCV 83.0 81.2   PLT 451* 147*   Basic Metabolic Panel Recent Labs    08/25/17 1356  NA 135  K 3.4*  CL 98*  CO2 25  GLUCOSE 134*  BUN 39*  CREATININE 1.28*  CALCIUM 8.8*   Liver Function Tests Recent Labs    08/25/17 1356  AST 31  ALT 14  ALKPHOS 79  BILITOT 0.6  PROT 7.2  ALBUMIN 3.4*   No results for input(s): LIPASE, AMYLASE in the last 72 hours. Cardiac Enzymes Recent Labs    08/25/17 1533 08/25/17 2029 08/26/17 0021  TROPONINI 0.79* 1.00* 1.04*   BNP No results for input(s): BNP in the last 72 hours. D-Dimer No results for input(s): DDIMER in the last 72 hours. Hemoglobin A1C No results for input(s): HGBA1C in the last 72 hours. Fasting Lipid Panel No results for input(s): CHOL, HDL, LDLCALC, TRIG, CHOLHDL, LDLDIRECT in the last 72 hours. Thyroid Function Tests No results for input(s): TSH, T4TOTAL, T3FREE, THYROIDAB in the last 72 hours.  Invalid input(s): FREET3  Telemetry    Normal sinus rhythm  ECG    Normal sinus rhythm  Radiology    Dg Lumbar Spine 2-3  Views  Result Date: 08/17/2017 CLINICAL DATA:  Spine surgery EXAM: LUMBAR SPINE - 2-3 VIEW; DG C-ARM 61-120 MIN COMPARISON:  MRI 08/16/2017, radiograph 08/07/2017 FINDINGS: Two low resolution intraoperative views of the lumbar spine. Total fluoroscopy time was 1 minutes 7 seconds. Vertebral augmentation changes at what appear to be T12 and L1 IMPRESSION: Intraoperative fluoroscopic assistance provided during lumbar spine intervention. Electronically Signed   By: Donavan Foil M.D.   On: 08/17/2017 16:59   Mr Lumbar Spine Wo Contrast  Result Date: 08/16/2017 CLINICAL DATA:  84 year old female with traumatic L1 spine fracture EXAM: MRI LUMBAR SPINE WITHOUT CONTRAST TECHNIQUE: Multiplanar, multisequence MR imaging of the lumbar spine was performed. No intravenous contrast was administered. COMPARISON:  [facility] hospital lumbar spine radiographs 08/07/2017 and earlier. Lumbar MRI 02/23/2012. FINDINGS:  Segmentation:  Normal. Alignment: Lumbar vertebral height and alignment from L2 to the sacrum is stable since 2013, including mild chronic retrolisthesis of L2-L3 through L4-L5. T12 and L1 are described below. Vertebrae: Comminuted fracture of the L1 vertebral body with internal T2 and STIR hyperintense hemorrhage. Superimposed trace internal gas phenomena such as with avascular necrosis (series 3, image 9). 30-40% loss of vertebral body height. Prominent comminution of the central portion of the body with retropulsed of bone resulting in L1 level AP spinal canal narrowing by 33% compared to 2013. There is also increased multifactorial spinal stenosis at T12-L1 and L1-L2, detailed below. Both L1 pedicles appear to be fractured. The remaining L1 posterior elements appear intact. Chronic previously augmented T12 compression fracture with moderate to severe loss of height and mild focal kyphosis. The T11 level is intact. The L2 through L5 lumbar levels and visible sacrum are intact. Conus medullaris and cauda equina: Conus extends to the L1-L2 level. No signal abnormality in the visible lower thoracic spinal cord or conus despite the multilevel spinal stenosis stated below. Paraspinal and other soft tissues: Mild medial psoas muscle and paraspinal soft tissue edema tracking inferiorly from the L1 level greater on the left (series 4, image 16). No discrete intramuscular fluid collection. Underlying chronic lumbar erector spinae muscle atrophy greater on the right. Stable and negative visible abdominal viscera. Disc levels: T12-L1: Mild spinal stenosis related to disc osteophyte complex, mild retropulsion of the posterior inferior T12 endplate and posterior element hypertrophy. No spinal cord mass effect. L1-L2: Moderate spinal stenosis is increased since 2013 related to circumferential disc bulge, superimposed right paracentral disc protrusion, and posterior element hypertrophy. There is mild mass effect on the conus.  There is mild bilateral L1 neural foraminal stenosis. L2-L3: Chronic mildly increased multifactorial spinal stenosis related to bulky right eccentric circumferential disc protrusion and up to moderate posterior element hypertrophy. Chronic moderate to severe left lateral recess stenosis (descending left L3 nerve level). Only mild L2 foraminal stenosis. L3-L4: Chronic mild spinal stenosis and moderate to severe left lateral recess stenosis related to left eccentric bulky circumferential disc bulge with endplate spurring and mild to moderate posterior element hypertrophy. Moderate to severe left L3 neural foraminal stenosis appears stable. L4-L5: Chronic bulky right eccentric circumferential disc bulge or protrusion with severe chronic right lateral recess stenosis at the right L5 nerve level. No significant spinal stenosis. Mild to moderate left and severe right L4 neural foraminal stenosis appears stable. L5-S1: Mild chronic anterolisthesis. Chronic severe facet hypertrophy has progressed along with degenerative facet joint fluid. Mild right eccentric circumferential disc bulge. Mild right lateral recess and right L5 neural foraminal stenosis have not significantly changed. IMPRESSION: 1. Acute to subacute L1 vertebral  body compression fracture is highly comminuted, demonstrates internal hemorrhage, trace gas which might indicate AVN, bilateral pedicle fractures, and some bony retropulsion resulting in moderate spinal stenosis. 30-40% loss of vertebral body height. 2. Multifactorial spinal stenosis also at the adjacent T12-L1 and L1-L2 disc spaces affects the lower thoracic spinal cord and conus with up to mild mass effect, but no associated spinal cord or conus signal abnormality. 3. Chronic previously augmented T12 compression fracture. 4. Chronic multifactorial lumbar spinal and lateral recess stenosis L2-L3 through L4-L5 is stable to mildly progressed since 2013. Electronically Signed   By: Genevie Ann M.D.   On:  08/16/2017 20:14   Dg C-arm 1-60 Min  Result Date: 08/17/2017 CLINICAL DATA:  Spine surgery EXAM: LUMBAR SPINE - 2-3 VIEW; DG C-ARM 61-120 MIN COMPARISON:  MRI 08/16/2017, radiograph 08/07/2017 FINDINGS: Two low resolution intraoperative views of the lumbar spine. Total fluoroscopy time was 1 minutes 7 seconds. Vertebral augmentation changes at what appear to be T12 and L1 IMPRESSION: Intraoperative fluoroscopic assistance provided during lumbar spine intervention. Electronically Signed   By: Donavan Foil M.D.   On: 08/17/2017 16:59    Assessment & Plan    GI bleed-hemoglobin 6.7.  Being followed by GI.  Possible colonoscopy this morning.  Have just continued aspirin will remain off of this.  Aortic stenosis-moderate by echo 2 months ago.  Appears stable hemodynamically.  Elevated troponin likely secondary to fixed cardiac output with aortic stenosis in the face of anemia.  Not a candidate for invasive evaluation.  Not a candidate for aspirin, Plavix or heparin.  Hypertension-we will carefully titrate her antihypertensives including metoprolol.  Blood pressure is improved today.  Will likely need to go back on metoprolol.  Would start with tartrate at low dose based on heart rate and blood pressure.  Signed, Javier Docker Wash Nienhaus MD 08/26/2017, 9:34 AM  Pager: [phone]

## 2017-08-26 NOTE — Anesthesia Postprocedure Evaluation (Signed)
Anesthesia Post Note  Patient: Kristy Hardy  Procedure(s) Performed: ESOPHAGOGASTRODUODENOSCOPY (EGD) WITH PROPOFOL (N/A )  Patient location during evaluation: PACU Anesthesia Type: General Level of consciousness: awake and alert and oriented Pain management: pain level controlled Vital Signs Assessment: post-procedure vital signs reviewed and stable Respiratory status: spontaneous breathing, nonlabored ventilation and respiratory function stable Cardiovascular status: blood pressure returned to baseline and stable Postop Assessment: no signs of nausea or vomiting Anesthetic complications: no   Pt found to have new afib prior to procedure. BP now stable in PACU, HR 130s, pt alert and oriented, denies any dizziness/lightheadedness. Both hospitalist and cardiologist aware of new afib, planning to work on rate control by restarting beta-blocker.  Last Vitals:  Vitals:   08/26/17 1344 08/26/17 1400  BP: (!) 94/51 93/65  Pulse: 76 (!) 54  Resp: 17 18  Temp:    SpO2: 96% 96%    Last Pain:  Vitals:   08/26/17 1400  TempSrc:   PainSc: 0-No pain                 Braydon Kullman

## 2017-08-26 NOTE — Progress Notes (Signed)
PT Cancellation Note  Patient Details Name: Kristy Hardy MRN: [id number] DOB: 07-Mar-1925   Cancelled Treatment:      PT consult received, chart reviewed. Pt Hgb noted to be low (6.7) and troponin to be elevated (1.04).  Per RN, Pt is receiving transfusion at this time. Will reattempt eval at a later date/time when medically appropriate.   Lorilei Horan Mondrian-Pardue, SPT 08/26/2017, 11:11 AM

## 2017-08-26 NOTE — Progress Notes (Addendum)
Report was called to Kristy Hardy. Patient was transported via bed on ra. Heart rate 107,  vss. amiodarone drip infusing at 33.33ml/hr. No distress noted

## 2017-08-26 NOTE — Progress Notes (Signed)
Family Meeting Note  Advance Directive:yes  Today a meeting took place with the Patient.and son    The following clinical team members were present during this meeting:MD  The following were discussed:Patient's diagnosis: GIB , Patient's progosis: Unable to determine and Goals for treatment: Full Code  Additional follow-up to be provided: FULL CODE No changes needed for advanced directives  Time spent during discussion:16 minutes  Montrel Donahoe, MD

## 2017-08-26 NOTE — Interval H&P Note (Signed)
History and Physical Interval Note:  08/26/2017 12:40 PM  Kristy Hardy  has presented today for surgery, with the diagnosis of Melena  The various methods of treatment have been discussed with the patient and family. After consideration of risks, benefits and other options for treatment, the patient has consented to  Procedure(s): ESOPHAGOGASTRODUODENOSCOPY (EGD) WITH PROPOFOL (N/A) as a surgical intervention .  The patient's history has been reviewed, patient examined, no change in status, stable for surgery.  I have reviewed the patient's chart and labs.  Questions were answered to the patient's satisfaction.     [place], [place]

## 2017-08-26 NOTE — Progress Notes (Signed)
Spoke with dr. Ubaldo Glassing to make aware patient heart rate remains in 120's per md give 0.125mg  iv digoxin. Will continue to monitor closely

## 2017-08-26 NOTE — Progress Notes (Signed)
Dr. Randa Lynn stated that it was okay for the patient to go to the floor with current heart rate in the 110-140 and BP in the mid 90s . She has seen the patient. The patient is awake and talking and is asymptomatic.

## 2017-08-26 NOTE — Progress Notes (Signed)
Spoke with dr. Ubaldo Glassing to make aware patient heart rate is 141 bp 94/65 per md give 0.25 iv digoxin and call back if heart rate does not improve

## 2017-08-26 NOTE — Op Note (Signed)
[facility] Gastroenterology Patient Name: Kristy Hardy Procedure Date: 08/26/2017 12:15 PM MRN: [id number] Account #: [id number] Date of Birth: [birth year] Admit Type: Inpatient Age: 84 Room: [facility] ENDO ROOM 4 Gender: Female Note Status: Finalized Procedure:            Upper GI endoscopy Indications:          Recent gastrointestinal bleeding, Suspected upper                        gastrointestinal bleeding Providers:            Benay Pike. Alice Reichert MD, MD Referring MD:         Irven Easterly. Kary Kos, MD (Referring MD) Medicines:            Propofol per Anesthesia Complications:        No immediate complications. Procedure:            Pre-Anesthesia Assessment:                       - The risks and benefits of the procedure and the                        sedation options and risks were discussed with the                        patient. All questions were answered and informed                        consent was obtained.                       - Patient identification and proposed procedure were                        verified prior to the procedure by the nurse. The                        procedure was verified in the procedure room.                       - ASA Grade Assessment: III - A patient with severe                        systemic disease.                       - After reviewing the risks and benefits, the patient                        was deemed in satisfactory condition to undergo the                        procedure.                       After obtaining informed consent, the endoscope was                        passed under direct vision. Throughout the procedure,  the patient's blood pressure, pulse, and oxygen                        saturations were monitored continuously. The Endoscope                        was introduced through the mouth, and advanced to the                        third part of duodenum. The upper GI endoscopy was                       accomplished without difficulty. The patient tolerated                        the procedure well. Findings:      LA Grade A (one or more mucosal breaks less than 5 mm, not extending       between tops of 2 mucosal folds) esophagitis with no bleeding was found       in the lower third of the esophagus.      Localized moderate inflammation characterized by erosions and erythema       was found in the prepyloric region of the stomach.      A small hiatal hernia was present.      The exam was otherwise without abnormality.      The examined duodenum was normal.      The exam was otherwise without abnormality. Impression:           - LA Grade A reflux esophagitis.                       - Gastritis.                       - Small hiatal hernia.                       - The examination was otherwise normal.                       - Normal examined duodenum.                       - The examination was otherwise normal.                       - No specimens collected. Recommendation:       - Return patient to hospital ward for ongoing care.                       - Perform a colonoscopy in 1 day.                       - Cardiology consult for rapid atrial fibrillation                        experienced today in endoscopy                       - Refer to the chart and orders for further  recommendations. Procedure Code(s):    --- Professional ---                       [phone], Esophagogastroduodenoscopy, flexible, transoral;                        diagnostic, including collection of specimen(s) by                        brushing or washing, when performed (separate procedure) Diagnosis Code(s):    --- Professional ---                       K21.0, Gastro-esophageal reflux disease with esophagitis                       K29.70, Gastritis, unspecified, without bleeding                       K44.9, Diaphragmatic hernia without obstruction or                         gangrene                       K92.2, Gastrointestinal hemorrhage, unspecified CPT copyright 2017 American Medical Association. All rights reserved. The codes documented in this report are preliminary and upon coder review may  be revised to meet current compliance requirements. Efrain Sella MD, MD 08/26/2017 12:56:25 PM This report has been signed electronically. Number of Addenda: 0 Note Initiated On: 08/26/2017 12:15 PM      [facility]

## 2017-08-26 NOTE — Progress Notes (Signed)
Pt noted to have new afib in procedure room prior to start of endoscopy with HR ranging 130-150s. Treated with esmolol prior to start of procedure which brought rate down to 120s. Required vasopressor support during procedure. No upper source of bleeding identified and patient brought to recovery room. Plan to treat with IV metoprolol in recovery room when BP is stable. Talked with Dr. Ubaldo Glassing over the phone to make him aware of new afib.

## 2017-08-26 NOTE — Progress Notes (Signed)
Dr. Randa Lynn at bedside talking with patient. Still okay with sending patient to the floor. Dr. Randa Lynn talked with Dr. Ubaldo Glassing about patient's afib.

## 2017-08-26 NOTE — Anesthesia Post-op Follow-up Note (Signed)
Anesthesia QCDR form completed.        

## 2017-08-26 NOTE — Progress Notes (Signed)
Pharmacy Electrolyte Monitoring Consult:  Pharmacy consulted to assist in monitoring and replacing electrolytes in this 84 y.o. female admitted on 08/25/2017 with Weakness and Near Syncope Patient with atrial fibrillation on amiodarone.   Labs:  Sodium (mmol/L)  Date Value  08/26/2017 136  09/01/2014 135   Potassium (mmol/L)  Date Value  08/26/2017 4.3  09/01/2014 4.1   Magnesium (mg/dL)  Date Value  08/26/2017 2.0  11/16/2012 1.8   Calcium (mg/dL)  Date Value  08/26/2017 8.6 (L)   Calcium, Total (mg/dL)  Date Value  09/01/2014 9.5   Albumin (g/dL)  Date Value  08/25/2017 3.4 (L)  09/01/2014 4.2    Plan: Electrolytes are WNL. Will continue to follow.   Kristy Hardy 08/26/2017 6:21 PM

## 2017-08-26 NOTE — Anesthesia Preprocedure Evaluation (Signed)
Anesthesia Evaluation  Patient identified by MRN, date of birth, ID band Patient awake    Reviewed: Allergy & Precautions, NPO status , Patient's Chart, lab work & pertinent test results  History of Anesthesia Complications Negative for: history of anesthetic complications  Airway Mallampati: III  TM Distance: >3 FB Neck ROM: Full    Dental  (+) Edentulous Upper, Partial Lower   Pulmonary neg sleep apnea, COPD,  COPD inhaler,    breath sounds clear to auscultation- rhonchi (-) wheezing      Cardiovascular hypertension, (-) CAD, (-) Past MI, (-) Cardiac Stents and (-) CABG + Valvular Problems/Murmurs (moderate AS)  Rhythm:Regular Rate:Normal - Systolic murmurs and - Diastolic murmurs    Neuro/Psych negative neurological ROS  negative psych ROS   GI/Hepatic Neg liver ROS, GIB    Endo/Other  negative endocrine ROSneg diabetes  Renal/GU Renal InsufficiencyRenal disease     Musculoskeletal  (+) Arthritis ,   Abdominal (+) - obese,   Peds  Hematology  (+) anemia ,   Anesthesia Other Findings Past Medical History: No date: Arthritis No date: Breast cancer ([place]) 11-11-91: Cancer ([place])     Comment:  left breast  08-12-14: Cancer ([place])     Comment:  right breast, ER/PR positive, Her2 negative No date: Carcinoma of left breast ([place])     Comment:  Left breast carcinoma; patient had lumpectomy radiation               therapy  No date: COPD (chronic obstructive pulmonary disease) ([facility]) No date: Heart valve problem     Comment:  leaking 2012: History of colonoscopy 2016: History of mammogram July 2014: Pneumonia   Reproductive/Obstetrics                             Anesthesia Physical Anesthesia Plan  ASA: III  Anesthesia Plan: General   Post-op Pain Management:    Induction: Intravenous  PONV Risk Score and Plan: 2 and Propofol infusion  Airway Management Planned: Natural  Airway  Additional Equipment:   Intra-op Plan:   Post-operative Plan:   Informed Consent: I have reviewed the patients History and Physical, chart, labs and discussed the procedure including the risks, benefits and alternatives for the proposed anesthesia with the patient or authorized representative who has indicated his/her understanding and acceptance.   Dental advisory given  Plan Discussed with: CRNA and Anesthesiologist  Anesthesia Plan Comments:         Anesthesia Quick Evaluation

## 2017-08-26 NOTE — Addendum Note (Signed)
Addendum  created 08/26/17 1448 by [place] Troia, Geradine Girt, CRNA   Intraprocedure Event edited, Intraprocedure Staff edited

## 2017-08-26 NOTE — Progress Notes (Signed)
Spoke with dr. Ubaldo Glassing regarding heart of 125 per md transfer to 2a and start on amiodarone drip with bolus

## 2017-08-26 NOTE — Progress Notes (Signed)
Paged MD regarding latest H/H of patient. Waiting for orders. Patient remains safe and comfortable. No complaints made thus far. Needs attended.

## 2017-08-26 NOTE — Progress Notes (Addendum)
[place] at [place] NAME: Kristy Hardy    MR#:  [id number]  DATE OF BIRTH:  1924-09-29  SUBJECTIVE:   Patient presents with melena  REVIEW OF SYSTEMS:    Review of Systems  Constitutional: Negative for fever, chills weight loss HENT: Negative for ear pain, nosebleeds, congestion, facial swelling, rhinorrhea, neck pain, neck stiffness and ear discharge.   Respiratory: Negative for cough, shortness of breath, wheezing  Cardiovascular: Negative for chest pain, palpitations and leg swelling.  Gastrointestinal: Negative for heartburn, abdominal pain, vomiting, diarrhea or consitpation Genitourinary: Negative for dysuria, urgency, frequency, hematuria Musculoskeletal: Negative for back pain or joint pain Neurological: Negative for dizziness, seizures, syncope, focal weakness,  numbness and headaches.  Hematological: Does not bruise/bleed easily.  Psychiatric/Behavioral: Negative for hallucinations, confusion, dysphoric mood    Tolerating Diet: npo      DRUG ALLERGIES:   Allergies  Allergen Reactions  . Amoxicillin-Pot Clavulanate Nausea And Vomiting and Other (See Comments)    Has patient had a PCN reaction causing immediate rash, facial/tongue/throat swelling, SOB or lightheadedness with hypotension: No Has patient had a PCN reaction causing severe rash involving mucus membranes or skin necrosis: No Has patient had a PCN reaction that required hospitalization: No Has patient had a PCN reaction occurring within the last 10 years: Unknown If all of the above answers are "NO", then may proceed with Cephalosporin use.   . Other Nausea And Vomiting  . Sulfa Antibiotics Rash    VITALS:  Blood pressure (!) 151/60, pulse 71, temperature 98 F (36.7 C), temperature source Oral, resp. rate 19, height 5\' 5"  (1.651 m), weight 70.9 kg (156 lb 4.9 oz), SpO2 97 %.  PHYSICAL EXAMINATION:  Constitutional: Appears well-developed and  well-nourished. No distress. HENT: Normocephalic. Marland Kitchen Oropharynx is clear and moist.  Eyes: Conjunctivae and EOM are normal. PERRLA, no scleral icterus.  Neck: Normal ROM. Neck supple. No JVD. No tracheal deviation. CVS: RRR, S1/S2 +, 3/6 SEM, no gallops, no carotid bruit.  Pulmonary: Effort and breath sounds normal, no stridor, rhonchi, wheezes, rales.  Abdominal: Soft. BS +,  no distension, tenderness, rebound or guarding.  Musculoskeletal: Normal range of motion. No edema and no tenderness.  Neuro: Alert. CN 2-12 grossly intact. No focal deficits. Skin: Skin is warm and dry. No rash noted. Psychiatric: Normal mood and affect.      LABORATORY PANEL:   CBC Recent Labs  Lab 08/26/17 0444  WBC 9.6  HGB 6.7*  HCT 20.8*  PLT 467*   ------------------------------------------------------------------------------------------------------------------  Chemistries  Recent Labs  Lab 08/25/17 1356  NA 135  K 3.4*  CL 98*  CO2 25  GLUCOSE 134*  BUN 39*  CREATININE 1.28*  CALCIUM 8.8*  AST 31  ALT 14  ALKPHOS 79  BILITOT 0.6   ------------------------------------------------------------------------------------------------------------------  Cardiac Enzymes Recent Labs  Lab 08/25/17 1533 08/25/17 2029 08/26/17 0021  TROPONINI 0.79* 1.00* 1.04*   ------------------------------------------------------------------------------------------------------------------  RADIOLOGY:  No results found.   ASSESSMENT AND PLAN:   84 year old female with history of chronic anemia who presents with melena.  1.  Melena: Plan is for EGD this afternoon. Continue PPI Stop NSAIDs and aspirin  2.  Acute on chronic blood loss anemia due to GI bleed: Patient is consented for blood transfusion. Transfuse 1 unit PRBC   3.  Elevated troponin: This is due to fixed cardiac output with aortic stenosis in the face of anemia patient is ruled out for ACS as per cardiology.  4.  Moderate aortic  stenosis: Patient will follow up with cardiology. 5.  Essential hypertension: Restart metoprolol and Hyzaar 6.  History of breast cancer on Femara   Management plans discussed with the patient and family and they are in agreement.  CODE STATUS: Full  TOTAL TIME TAKING CARE OF THIS PATIENT: 30 minutes.   Discussed with Dr. Ubaldo Glassing and Dr. Alice Reichert  POSSIBLE D/C 1-2 days, DEPENDING ON CLINICAL CONDITION.   Marlita Keil M.D on 08/26/2017 at 11:10 AM  Between 7am to 6pm - Pager - [phone] After 6pm go to [email or web address] - password [email or web address] [facility]  Office  [phone]  CC: Primary care physician; [name], MD  Note: This dictation was prepared with Dragon dictation along with smaller phrase technology. Any transcriptional errors that result from this process are unintentional.

## 2017-08-26 NOTE — Progress Notes (Signed)
Patient transferred into room 246. No distress. Family at bedside. Amio gtt infusing. Educated on safety. Bed alarm on. No belongings in room.

## 2017-08-27 ENCOUNTER — Encounter
Admission: EM | Disposition: A | Discharge status: Home / Self Care | Payer: Self-pay | Source: Home / Self Care | Attending: Internal Medicine

## 2017-08-27 ENCOUNTER — Encounter: Payer: Self-pay | Admitting: Anesthesiology

## 2017-08-27 ENCOUNTER — Inpatient Hospital Stay: Payer: Medicare Other | Admitting: Anesthesiology

## 2017-08-27 HISTORY — PX: COLONOSCOPY WITH PROPOFOL: SHX5780

## 2017-08-27 LAB — CBC
HEMATOCRIT: 24.2 % — AB (ref 35.0–47.0)
Hemoglobin: 7.8 g/dL — ABNORMAL LOW (ref 12.0–16.0)
MCH: 26.4 pg (ref 26.0–34.0)
MCHC: 32.2 g/dL (ref 32.0–36.0)
MCV: 81.9 fL (ref 80.0–100.0)
PLATELETS: 432 10*3/uL (ref 150–440)
RBC: 2.96 MIL/uL — ABNORMAL LOW (ref 3.80–5.20)
RDW: 16.6 % — AB (ref 11.5–14.5)
WBC: 9 10*3/uL (ref 3.6–11.0)

## 2017-08-27 LAB — BASIC METABOLIC PANEL
ANION GAP: 8 (ref 5–15)
BUN: 22 mg/dL — ABNORMAL HIGH (ref 6–20)
CALCIUM: 8.2 mg/dL — AB (ref 8.9–10.3)
CO2: 27 mmol/L (ref 22–32)
Chloride: 101 mmol/L (ref 101–111)
Creatinine, Ser: 0.96 mg/dL (ref 0.44–1.00)
GFR, EST AFRICAN AMERICAN: 59 mL/min — AB (ref >60)
GFR, EST NON AFRICAN AMERICAN: 51 mL/min — AB (ref >60)
Glucose, Bld: 105 mg/dL — ABNORMAL HIGH (ref 65–99)
Potassium: 3.1 mmol/L — ABNORMAL LOW (ref 3.5–5.1)
SODIUM: 136 mmol/L (ref 135–145)

## 2017-08-27 LAB — MAGNESIUM: MAGNESIUM: 1.9 mg/dL (ref 1.7–2.4)

## 2017-08-27 LAB — POTASSIUM: POTASSIUM: 3.5 mmol/L (ref 3.5–5.1)

## 2017-08-27 SURGERY — COLONOSCOPY WITH PROPOFOL
Anesthesia: General

## 2017-08-27 MED ORDER — AMIODARONE HCL 200 MG PO TABS
200.0000 mg | ORAL_TABLET | Freq: Two times a day (BID) | ORAL | Status: DC
  Administered 2017-08-27 – 2017-08-28 (×3): 200 mg via ORAL
  Filled 2017-08-27 (×3): qty 1

## 2017-08-27 MED ORDER — PROPOFOL 10 MG/ML IV BOLUS
INTRAVENOUS | Status: DC | PRN
  Administered 2017-08-27: 20 mg via INTRAVENOUS
  Administered 2017-08-27: 40 mg via INTRAVENOUS

## 2017-08-27 MED ORDER — POTASSIUM CHLORIDE 20 MEQ PO PACK: 20.0000 meq | PACK | Freq: Once | ORAL | Status: DC

## 2017-08-27 MED ORDER — PHENYLEPHRINE HCL 10 MG/ML IJ SOLN
INTRAMUSCULAR | Status: DC | PRN
  Administered 2017-08-27 (×2): 100 ug via INTRAVENOUS

## 2017-08-27 MED ORDER — LIDOCAINE 2% (20 MG/ML) 5 ML SYRINGE
INTRAMUSCULAR | Status: DC | PRN
  Administered 2017-08-27: 60 mg via INTRAVENOUS

## 2017-08-27 MED ORDER — SODIUM CHLORIDE 0.9 % IV SOLN: INTRAVENOUS | Status: DC

## 2017-08-27 MED ORDER — SODIUM CHLORIDE 0.9 % IV SOLN
INTRAVENOUS | Status: DC
  Administered 2017-08-27: 05:00:00 via INTRAVENOUS

## 2017-08-27 MED ORDER — PROPOFOL 500 MG/50ML IV EMUL
INTRAVENOUS | Status: DC | PRN
  Administered 2017-08-27: 150 ug/kg/min via INTRAVENOUS

## 2017-08-27 MED ORDER — POTASSIUM CHLORIDE 20 MEQ PO PACK
40.0000 meq | PACK | Freq: Once | ORAL | Status: AC
  Administered 2017-08-27: 40 meq via ORAL
  Filled 2017-08-27: qty 2

## 2017-08-27 MED ORDER — METOPROLOL SUCCINATE ER 50 MG PO TB24
75.0000 mg | ORAL_TABLET | Freq: Every day | ORAL | Status: DC
  Administered 2017-08-28: 75 mg via ORAL
  Filled 2017-08-27: qty 1

## 2017-08-27 NOTE — Anesthesia Postprocedure Evaluation (Signed)
Anesthesia Post Note  Patient: Evelena Peat Cornelia Weigel  Procedure(s) Performed: COLONOSCOPY WITH PROPOFOL (N/A )  Patient location during evaluation: PACU Anesthesia Type: General Level of consciousness: awake and alert Pain management: pain level controlled Vital Signs Assessment: post-procedure vital signs reviewed and stable Respiratory status: spontaneous breathing, nonlabored ventilation, respiratory function stable and patient connected to nasal cannula oxygen Cardiovascular status: blood pressure returned to baseline and stable Postop Assessment: no apparent nausea or vomiting Anesthetic complications: no     Last Vitals:  Vitals:   08/27/17 1052 08/27/17 1058  BP:  (!) 143/52  Pulse: 62 67  Resp: 15 18  Temp:    SpO2: 98% 97%    Last Pain:  Vitals:   08/27/17 1058  TempSrc:   PainSc: 0-No pain                 Precious Haws Ireland Virrueta

## 2017-08-27 NOTE — Progress Notes (Addendum)
[place] at [place] NAME: Kristy Hardy    MR#:  [id number]  DATE OF BIRTH:  1924-08-01  SUBJECTIVE:   Patient underwent EGD yesterday showing esophagitis She was found to be in atrial fibrillation with RVR and transferred to stepdown unit on amiodarone drip.  She received 1 dose of digoxin as well yesterday. Patient without shortness of breath, chest pain No melena No abdominal pain She is completed prep for colonoscopy  REVIEW OF SYSTEMS:    Review of Systems  Constitutional: Negative for fever, chills weight loss HENT: Negative for ear pain, nosebleeds, congestion, facial swelling, rhinorrhea, neck pain, neck stiffness and ear discharge.   Respiratory: Negative for cough, shortness of breath, wheezing  Cardiovascular: Negative for chest pain, palpitations and leg swelling.  Gastrointestinal: Negative for heartburn, abdominal pain, vomiting, diarrhea or consitpation Genitourinary: Negative for dysuria, urgency, frequency, hematuria Musculoskeletal: Negative for back pain or joint pain Neurological: Negative for dizziness, seizures, syncope, focal weakness,  numbness and headaches.  Hematological: Does not bruise/bleed easily.  Psychiatric/Behavioral: Negative for hallucinations, confusion, dysphoric mood    Tolerating Diet: npo      DRUG ALLERGIES:   Allergies  Allergen Reactions  . Amoxicillin-Pot Clavulanate Nausea And Vomiting and Other (See Comments)    Has patient had a PCN reaction causing immediate rash, facial/tongue/throat swelling, SOB or lightheadedness with hypotension: No Has patient had a PCN reaction causing severe rash involving mucus membranes or skin necrosis: No Has patient had a PCN reaction that required hospitalization: No Has patient had a PCN reaction occurring within the last 10 years: Unknown If all of the above answers are "NO", then may proceed with Cephalosporin use.   . Other Nausea And  Vomiting  . Sulfa Antibiotics Rash    VITALS:  Blood pressure 116/66, pulse (!) 113, temperature 97.7 F (36.5 C), temperature source Oral, resp. rate 18, height 5\' 5"  (1.651 m), weight 70.3 kg (154 lb 14.4 oz), SpO2 93 %.  PHYSICAL EXAMINATION:  Constitutional: Appears well-developed and well-nourished. No distress. HENT: Normocephalic. Marland Kitchen Oropharynx is clear and moist.  Eyes: Conjunctivae and EOM are normal. PERRLA, no scleral icterus.  Neck: Normal ROM. Neck supple. No JVD. No tracheal deviation. CVS: RRR, S1/S2 +, 3/6 SEM, no gallops, no carotid bruit.  Pulmonary: Effort and breath sounds normal, no stridor, rhonchi, wheezes, rales.  Abdominal: Soft. BS +,  no distension, tenderness, rebound or guarding.  Musculoskeletal: Normal range of motion. No edema and no tenderness.  Neuro: Alert. CN 2-12 grossly intact. No focal deficits. Skin: Skin is warm and dry. No rash noted. Psychiatric: Normal mood and affect.      LABORATORY PANEL:   CBC Recent Labs  Lab 08/27/17 0435  WBC 9.0  HGB 7.8*  HCT 24.2*  PLT 432   ------------------------------------------------------------------------------------------------------------------  Chemistries  Recent Labs  Lab 08/25/17 1356  08/27/17 0435  NA 135   < > 136  K 3.4*   < > 3.1*  CL 98*   < > 101  CO2 25   < > 27  GLUCOSE 134*   < > 105*  BUN 39*   < > 22*  CREATININE 1.28*   < > 0.96  CALCIUM 8.8*   < > 8.2*  MG  --    < > 1.9  AST 31  --   --   ALT 14  --   --   ALKPHOS 79  --   --   BILITOT 0.6  --   --    < > =  values in this interval not displayed.   ------------------------------------------------------------------------------------------------------------------  Cardiac Enzymes Recent Labs  Lab 08/25/17 1533 08/25/17 2029 08/26/17 0021  TROPONINI 0.79* 1.00* 1.04*   ------------------------------------------------------------------------------------------------------------------  RADIOLOGY:  No  results found.   ASSESSMENT AND PLAN:   84 year old female with history of chronic anemia who presents with melena.  1.  Melena: EGD yesterday showed LA grade a reflux esophagitis and gastritis Plan for colonoscopy this morning Continue PPI Stop NSAIDs and aspirin  2.  Acute on chronic iron deficiency blood loss anemia due to GI bleed: Patient is status post 1 unit PRBC  3.  Elevated troponin: This is due to fixed cardiac output with aortic stenosis in the face of anemia patient is ruled out for ACS as per cardiology.  4.  Nonrheumatic moderate aortic stenosis: Patient will follow up with cardiology. 5.  Essential hypertension: Increase metoprolol for better heart rate control Stop Hyzaar for now to allow increase in beta-blocker 6.  History of breast cancer on Femara 7.  New onset atrial fibrillation in the setting of anemia and known aortic stenosis Continue amiodarone drip until procedure with plan hopefully to convert to oral amiodarone  Continue increased dose of metoprolol Cardiology following Not a candidate for anticoagulation due to GI bleed Last echocardiogram January 2019 and therefore I will not repeat  8.  Hypokalemia: Pharmacy to replete and recheck in a.m. Management plans discussed with the patient and family and they are in agreement.  CODE STATUS: Full  TOTAL TIME TAKING CARE OF THIS PATIENT: 28 minutes.   Discussed with Dr. Ubaldo Glassing    Patient is currently at [facility].  PT consultation for discharge planning.  Clinical social worker consult placed. POSSIBLE D/C 1-2 days, DEPENDING ON CLINICAL CONDITION.   Kristy Hardy M.D on 08/27/2017 at 8:02 AM  Between 7am to 6pm - Pager - [phone] After 6pm go to [email or web address] - password [email or web address] [place]  Office  [phone]  CC: Primary care physician; [name], MD  Note: This dictation was prepared with Dragon dictation along with smaller phrase technology. Any  transcriptional errors that result from this process are unintentional.

## 2017-08-27 NOTE — Transfer of Care (Signed)
Immediate Anesthesia Transfer of Care Note  Patient: Kristy Hardy  Procedure(s) Performed: COLONOSCOPY WITH PROPOFOL (N/A )  Patient Location: PACU  Anesthesia Type:General  Level of Consciousness: awake, alert  and oriented  Airway & Oxygen Therapy: Patient connected to nasal cannula oxygen  Post-op Assessment: Post -op Vital signs reviewed and stable  Post vital signs: stable  Last Vitals:  Vitals Value Taken Time  BP 88/40 08/27/2017  9:54 AM  Temp 36.8 C 08/27/2017  9:49 AM  Pulse 61 08/27/2017  9:54 AM  Resp 17 08/27/2017  9:54 AM  SpO2 100 % 08/27/2017  9:54 AM  Vitals shown include unvalidated device data.  Last Pain:  Vitals:   08/27/17 0949  TempSrc: Temporal  PainSc:          Complications: No apparent anesthesia complications

## 2017-08-27 NOTE — Progress Notes (Signed)
Pharmacy Electrolyte Monitoring Consult:  Pharmacy consulted to assist in monitoring and replacing electrolytes in this 84 y.o. female admitted on 08/25/2017 with Weakness and Near Syncope Patient with atrial fibrillation on amiodarone.   Labs:  Sodium (mmol/L)  Date Value  08/27/2017 136  09/01/2014 135   Potassium (mmol/L)  Date Value  08/27/2017 3.1 (L)  09/01/2014 4.1   Magnesium (mg/dL)  Date Value  08/27/2017 1.9  11/16/2012 1.8   Calcium (mg/dL)  Date Value  08/27/2017 8.2 (L)   Calcium, Total (mg/dL)  Date Value  09/01/2014 9.5   Albumin (g/dL)  Date Value  08/25/2017 3.4 (L)  09/01/2014 4.2    Plan: K 3.1. Patient already on Magnesium 200 mg po daily. Will order KCL 40 meq po x 1. Wil recheck at 1800. F/u labs in am  Jordie Schreur A 08/27/2017 8:14 AM

## 2017-08-27 NOTE — Progress Notes (Signed)
Patient Name: Kristy Hardy Date of Encounter: 08/27/2017  Hospital Problem List     Active Problems:   GI bleed    Patient Profile     84 year old female with history of moderate aortic stenosis, atrial fibrillation and GI bleed.  Status post upper and lower endoscopy.  No obvious source of bleeding.  Currently hemodynamically stable and back in sinus rhythm.  Subjective   No complaints at present.  Inpatient Medications    . amiodarone  200 mg Oral BID  . ferrous gluconate  324 mg Oral BID  . fluticasone  1 spray Each Nare BID  . letrozole  2.5 mg Oral Daily  . magnesium oxide  200 mg Oral Daily  . metoprolol succinate  75 mg Oral Daily  . pantoprazole (PROTONIX) IV  40 mg Intravenous Q12H  . potassium chloride  40 mEq Oral Once  . traMADol  50 mg Oral BID  . vitamin B-12  1,000 mcg Oral Daily    Vital Signs    Vitals:   08/27/17 1034 08/27/17 1044 08/27/17 1052 08/27/17 1058  BP: 100/87 (!) 130/52  (!) 143/52  Pulse: 66 62 62 67  Resp: 20 15 15 18   Temp:      TempSrc:      SpO2: 98% 96% 98% 97%  Weight:      Height:        Intake/Output Summary (Last 24 hours) at 08/27/2017 1118 Last data filed at 08/27/2017 1027 Gross per 24 hour  Intake 838.88 ml  Output 400 ml  Net 438.88 ml   Filed Weights   08/25/17 1330 08/25/17 2019 08/27/17 0446  Weight: 77 kg (169 lb 11.2 oz) 70.9 kg (156 lb 4.9 oz) 70.3 kg (154 lb 14.4 oz)    Physical Exam    GEN: Well nourished, well developed, in no acute distress.  HEENT: normal.  Neck: Supple, no JVD, carotid bruits, or masses. Cardiac: RRR, 2/6 to 3/6 systolic murmur radiating to the outflow tract.. No clubbing, cyanosis, edema.  Radials/DP/PT 2+ and equal bilaterally.  Respiratory:  Respirations regular and unlabored, clear to auscultation bilaterally. GI: Soft, nontender, nondistended, BS + x 4. MS: no deformity or atrophy. Skin: warm and dry, no rash. Neuro:  Strength and sensation are intact. Psych:  Normal affect.  Labs    CBC Recent Labs    08/25/17 1356 08/26/17 0444 08/26/17 1525 08/27/17 0435  WBC 13.2* 9.6  --  9.0  NEUTROABS 10.4*  --   --   --   HGB 7.8* 6.7* 7.9* 7.8*  HCT 24.7* 20.8* 24.2* 24.2*  MCV 83.0 81.2  --  81.9  PLT 451* 467*  --  151   Basic Metabolic Panel Recent Labs    08/26/17 1710 08/27/17 0435  NA 136 136  K 4.3 3.1*  CL 104 101  CO2 23 27  GLUCOSE 102* 105*  BUN 31* 22*  CREATININE 1.10* 0.96  CALCIUM 8.6* 8.2*  MG 2.0 1.9   Liver Function Tests Recent Labs    08/25/17 1356  AST 31  ALT 14  ALKPHOS 79  BILITOT 0.6  PROT 7.2  ALBUMIN 3.4*   No results for input(s): LIPASE, AMYLASE in the last 72 hours. Cardiac Enzymes Recent Labs    08/25/17 1533 08/25/17 2029 08/26/17 0021  TROPONINI 0.79* 1.00* 1.04*   BNP No results for input(s): BNP in the last 72 hours. D-Dimer No results for input(s): DDIMER in the last 72 hours. Hemoglobin A1C No  results for input(s): HGBA1C in the last 72 hours. Fasting Lipid Panel No results for input(s): CHOL, HDL, LDLCALC, TRIG, CHOLHDL, LDLDIRECT in the last 72 hours. Thyroid Function Tests No results for input(s): TSH, T4TOTAL, T3FREE, THYROIDAB in the last 72 hours.  Invalid input(s): FREET3  Telemetry    Sinus rhythm with previous showing A. fib with variable but fairly rapid ventricular response  ECG    Sinus rhythm at present  Radiology    Dg Lumbar Spine 2-3 Views  Result Date: 08/17/2017 CLINICAL DATA:  Spine surgery EXAM: LUMBAR SPINE - 2-3 VIEW; DG C-ARM 61-120 MIN COMPARISON:  MRI 08/16/2017, radiograph 08/07/2017 FINDINGS: Two low resolution intraoperative views of the lumbar spine. Total fluoroscopy time was 1 minutes 7 seconds. Vertebral augmentation changes at what appear to be T12 and L1 IMPRESSION: Intraoperative fluoroscopic assistance provided during lumbar spine intervention. Electronically Signed   By: Donavan Foil M.D.   On: 08/17/2017 16:59   Mr Lumbar  Spine Wo Contrast  Result Date: 08/16/2017 CLINICAL DATA:  84 year old female with traumatic L1 spine fracture EXAM: MRI LUMBAR SPINE WITHOUT CONTRAST TECHNIQUE: Multiplanar, multisequence MR imaging of the lumbar spine was performed. No intravenous contrast was administered. COMPARISON:  [facility] lumbar spine radiographs 08/07/2017 and earlier. Lumbar MRI 02/23/2012. FINDINGS: Segmentation:  Normal. Alignment: Lumbar vertebral height and alignment from L2 to the sacrum is stable since 2013, including mild chronic retrolisthesis of L2-L3 through L4-L5. T12 and L1 are described below. Vertebrae: Comminuted fracture of the L1 vertebral body with internal T2 and STIR hyperintense hemorrhage. Superimposed trace internal gas phenomena such as with avascular necrosis (series 3, image 9). 30-40% loss of vertebral body height. Prominent comminution of the central portion of the body with retropulsed of bone resulting in L1 level AP spinal canal narrowing by 33% compared to 2013. There is also increased multifactorial spinal stenosis at T12-L1 and L1-L2, detailed below. Both L1 pedicles appear to be fractured. The remaining L1 posterior elements appear intact. Chronic previously augmented T12 compression fracture with moderate to severe loss of height and mild focal kyphosis. The T11 level is intact. The L2 through L5 lumbar levels and visible sacrum are intact. Conus medullaris and cauda equina: Conus extends to the L1-L2 level. No signal abnormality in the visible lower thoracic spinal cord or conus despite the multilevel spinal stenosis stated below. Paraspinal and other soft tissues: Mild medial psoas muscle and paraspinal soft tissue edema tracking inferiorly from the L1 level greater on the left (series 4, image 16). No discrete intramuscular fluid collection. Underlying chronic lumbar erector spinae muscle atrophy greater on the right. Stable and negative visible abdominal viscera. Disc levels: T12-L1: Mild  spinal stenosis related to disc osteophyte complex, mild retropulsion of the posterior inferior T12 endplate and posterior element hypertrophy. No spinal cord mass effect. L1-L2: Moderate spinal stenosis is increased since 2013 related to circumferential disc bulge, superimposed right paracentral disc protrusion, and posterior element hypertrophy. There is mild mass effect on the conus. There is mild bilateral L1 neural foraminal stenosis. L2-L3: Chronic mildly increased multifactorial spinal stenosis related to bulky right eccentric circumferential disc protrusion and up to moderate posterior element hypertrophy. Chronic moderate to severe left lateral recess stenosis (descending left L3 nerve level). Only mild L2 foraminal stenosis. L3-L4: Chronic mild spinal stenosis and moderate to severe left lateral recess stenosis related to left eccentric bulky circumferential disc bulge with endplate spurring and mild to moderate posterior element hypertrophy. Moderate to severe left L3 neural foraminal stenosis appears  stable. L4-L5: Chronic bulky right eccentric circumferential disc bulge or protrusion with severe chronic right lateral recess stenosis at the right L5 nerve level. No significant spinal stenosis. Mild to moderate left and severe right L4 neural foraminal stenosis appears stable. L5-S1: Mild chronic anterolisthesis. Chronic severe facet hypertrophy has progressed along with degenerative facet joint fluid. Mild right eccentric circumferential disc bulge. Mild right lateral recess and right L5 neural foraminal stenosis have not significantly changed. IMPRESSION: 1. Acute to subacute L1 vertebral body compression fracture is highly comminuted, demonstrates internal hemorrhage, trace gas which might indicate AVN, bilateral pedicle fractures, and some bony retropulsion resulting in moderate spinal stenosis. 30-40% loss of vertebral body height. 2. Multifactorial spinal stenosis also at the adjacent T12-L1 and  L1-L2 disc spaces affects the lower thoracic spinal cord and conus with up to mild mass effect, but no associated spinal cord or conus signal abnormality. 3. Chronic previously augmented T12 compression fracture. 4. Chronic multifactorial lumbar spinal and lateral recess stenosis L2-L3 through L4-L5 is stable to mildly progressed since 2013. Electronically Signed   By: Genevie Ann M.D.   On: 08/16/2017 20:14   Dg C-arm 1-60 Min  Result Date: 08/17/2017 CLINICAL DATA:  Spine surgery EXAM: LUMBAR SPINE - 2-3 VIEW; DG C-ARM 61-120 MIN COMPARISON:  MRI 08/16/2017, radiograph 08/07/2017 FINDINGS: Two low resolution intraoperative views of the lumbar spine. Total fluoroscopy time was 1 minutes 7 seconds. Vertebral augmentation changes at what appear to be T12 and L1 IMPRESSION: Intraoperative fluoroscopic assistance provided during lumbar spine intervention. Electronically Signed   By: Donavan Foil M.D.   On: 08/17/2017 16:59    Assessment & Plan    Atrial fibrillation-currently back in sinus rhythm after treatment with IV amiodarone.  Will discontinue IV amiodarone and place on amiodarone 200 mg twice daily.  Will follow heart rate but consider reducing metoprolol succinate to 50 mg daily based on heart rate.  Not a candidate for anticoagulation.  Would also discontinue aspirin and remain off of any antiplatelet or antiarrhythmic medication at present given bleeding risk.  GI bleed-no obvious source of GI bleed by EGD or colonoscopy.  Will discontinue aspirin will follow.  Aortic stenosis-moderate aortic stenosis.  Will follow.  Clinically stable.  Signed, Javier Docker Greg Eckrich MD 08/27/2017, 11:18 AM  Pager: [phone]

## 2017-08-27 NOTE — Progress Notes (Signed)
Pt converted to NSR at 08:17 / Dr. Ubaldo Glassing made aware/ will continue to assess.

## 2017-08-27 NOTE — Interval H&P Note (Signed)
History and Physical Interval Note:  08/27/2017 9:57 AM  Kristy Hardy  has presented today for surgery, with the diagnosis of Melena, anemia  The various methods of treatment have been discussed with the patient and family. After consideration of risks, benefits and other options for treatment, the patient has consented to  Procedure(s): COLONOSCOPY WITH PROPOFOL (N/A) as a surgical intervention .  The patient's history has been reviewed, patient examined, no change in status, stable for surgery.  I have reviewed the patient's chart and labs.  Questions were answered to the patient's satisfaction.     [place], [place]

## 2017-08-27 NOTE — Progress Notes (Signed)
Cancellation Note:  Pt currently off floor for procedure.  Will re-attempt PT evaluation at a later date/time.  Leitha Bleak, PT 08/27/17, 10:49 AM [phone]

## 2017-08-27 NOTE — Anesthesia Preprocedure Evaluation (Signed)
Anesthesia Evaluation  Patient identified by MRN, date of birth, ID band Patient awake    Reviewed: Allergy & Precautions, NPO status , Patient's Chart, lab work & pertinent test results  History of Anesthesia Complications Negative for: history of anesthetic complications  Airway Mallampati: III  TM Distance: >3 FB Neck ROM: Full    Dental  (+) Edentulous Upper, Partial Lower, Poor Dentition, Chipped, Missing   Pulmonary neg sleep apnea, pneumonia, COPD,  COPD inhaler,    breath sounds clear to auscultation- rhonchi (-) wheezing      Cardiovascular hypertension, (-) CAD, (-) Past MI, (-) Cardiac Stents and (-) CABG + Valvular Problems/Murmurs (moderate AS)  Rhythm:Regular Rate:Normal - Systolic murmurs and - Diastolic murmurs    Neuro/Psych negative neurological ROS  negative psych ROS   GI/Hepatic Neg liver ROS, GIB    Endo/Other  negative endocrine ROSneg diabetes  Renal/GU Renal InsufficiencyRenal disease     Musculoskeletal  (+) Arthritis ,   Abdominal (+) - obese,   Peds  Hematology  (+) anemia ,   Anesthesia Other Findings Past Medical History: No date: Arthritis No date: Breast cancer ([place]) 11-11-91: Cancer ([place])     Comment:  left breast  08-12-14: Cancer ([place])     Comment:  right breast, ER/PR positive, Her2 negative No date: Carcinoma of left breast ([place])     Comment:  Left breast carcinoma; patient had lumpectomy radiation               therapy  No date: COPD (chronic obstructive pulmonary disease) ([facility]) No date: Heart valve problem     Comment:  leaking 2012: History of colonoscopy 2016: History of mammogram July 2014: Pneumonia   Reproductive/Obstetrics                             Anesthesia Physical  Anesthesia Plan  ASA: III  Anesthesia Plan: General   Post-op Pain Management:    Induction: Intravenous  PONV Risk Score and Plan: 2 and Propofol infusion  and TIVA  Airway Management Planned: Natural Airway and Nasal Cannula  Additional Equipment:   Intra-op Plan:   Post-operative Plan:   Informed Consent: I have reviewed the patients History and Physical, chart, labs and discussed the procedure including the risks, benefits and alternatives for the proposed anesthesia with the patient or authorized representative who has indicated his/her understanding and acceptance.   Dental advisory given  Plan Discussed with: CRNA and Anesthesiologist  Anesthesia Plan Comments: (Patient consented for risks of anesthesia including but not limited to:  - adverse reactions to medications - risk of intubation if required - damage to teeth, lips or other oral mucosa - sore throat or hoarseness - Damage to heart, brain, lungs or loss of life  Patient voiced understanding.)        Anesthesia Quick Evaluation

## 2017-08-27 NOTE — Anesthesia Post-op Follow-up Note (Signed)
Anesthesia QCDR form completed.        

## 2017-08-27 NOTE — Op Note (Signed)
[facility] Gastroenterology Patient Name: Kristy Hardy Procedure Date: 08/27/2017 9:08 AM MRN: [id number] Account #: [id number] Date of Birth: Feb 23, 1925 Admit Type: Inpatient Age: 84 Room: [facility] ENDO ROOM 4 Gender: Female Note Status: Finalized Procedure:            Colonoscopy Indications:          Evaluation of unexplained GI bleeding Providers:            Benay Pike. Alice Reichert MD, MD Referring MD:         Irven Easterly. Kary Kos, MD (Referring MD) Medicines:            Propofol per Anesthesia Complications:        No immediate complications. Procedure:            Pre-Anesthesia Assessment:                       - The risks and benefits of the procedure and the                        sedation options and risks were discussed with the                        patient. All questions were answered and informed                        consent was obtained.                       - Patient identification and proposed procedure were                        verified prior to the procedure by the nurse. The                        procedure was verified in the procedure room.                       - ASA Grade Assessment: III - A patient with severe                        systemic disease.                       - After reviewing the risks and benefits, the patient                        was deemed in satisfactory condition to undergo the                        procedure.                       After obtaining informed consent, the colonoscope was                        passed under direct vision. Throughout the procedure,                        the patient's blood pressure, pulse, and oxygen  saturations were monitored continuously. The                        Colonoscope was introduced through the anus and                        advanced to the the cecum, identified by appendiceal                        orifice and ileocecal valve. The colonoscopy was              performed without difficulty. The patient tolerated the                        procedure well. The quality of the bowel preparation                        was good. The ileocecal valve, appendiceal orifice, and                        rectum were photographed. Findings:      The perianal and digital rectal examinations were normal. Pertinent       negatives include normal sphincter tone and no palpable rectal lesions.      A 3 mm polyp was found in the ascending colon. The polyp was sessile.       The polyp was removed with a jumbo cold forceps. Resection and retrieval       were complete.      Non-bleeding internal hemorrhoids were found during retroflexion. The       hemorrhoids were Grade I (internal hemorrhoids that do not prolapse).      The exam was otherwise without abnormality. Impression:           - One 3 mm polyp in the ascending colon, removed with a                        jumbo cold forceps. Resected and retrieved.                       - Non-bleeding internal hemorrhoids.                       - The examination was otherwise normal. Recommendation:       - Return patient to hospital ward for possible                        discharge same day.                       - No repeat colonoscopy due to age                       - Will consider small bowel wireless capsule endoscopy                        based upon review of pathology results.                       - Advance diet as tolerated - advance as tolerated to  resume regular diet.                       - Continue present medications. Procedure Code(s):    --- Professional ---                       [phone], Colonoscopy, flexible; with biopsy, single or                        multiple Diagnosis Code(s):    --- Professional ---                       K92.2, Gastrointestinal hemorrhage, unspecified                       K64.0, First degree hemorrhoids                       D12.2, Benign neoplasm  of ascending colon CPT copyright 2017 American Medical Association. All rights reserved. The codes documented in this report are preliminary and upon coder review may  be revised to meet current compliance requirements. Efrain Sella MD, MD 08/27/2017 9:45:22 AM This report has been signed electronically. Number of Addenda: 0 Note Initiated On: 08/27/2017 9:08 AM Scope Withdrawal Time: 0 hours 7 minutes 18 seconds  Total Procedure Duration: 0 hours 9 minutes 25 seconds       [facility]

## 2017-08-27 NOTE — Progress Notes (Signed)
Go lightly administered this shift. Patient had been having loose stool throughout the night without any blood. Amiodarone drip is running at 16.7 mL/hr and HR is sustaining mostly at 110s. No acute distress noted. Will continue to monitor.

## 2017-08-27 NOTE — Progress Notes (Signed)
Pharmacy Electrolyte Monitoring Consult:  Pharmacy consulted to assist in monitoring and replacing electrolytes in this 84 y.o. female admitted on 08/25/2017 with Weakness and Near Syncope Patient with atrial fibrillation on amiodarone.   Labs:  Sodium (mmol/L)  Date Value  08/27/2017 136  09/01/2014 135   Potassium (mmol/L)  Date Value  08/27/2017 3.5  09/01/2014 4.1   Magnesium (mg/dL)  Date Value  08/27/2017 1.9  11/16/2012 1.8   Calcium (mg/dL)  Date Value  08/27/2017 8.2 (L)   Calcium, Total (mg/dL)  Date Value  09/01/2014 9.5   Albumin (g/dL)  Date Value  08/25/2017 3.4 (L)  09/01/2014 4.2    Assessment/Plan: K is WNL.  F/u labs in am  Napoleon Form 08/27/2017 6:35 PM

## 2017-08-28 ENCOUNTER — Encounter: Payer: Self-pay | Admitting: Internal Medicine

## 2017-08-28 LAB — BASIC METABOLIC PANEL
ANION GAP: 6 (ref 5–15)
BUN: 17 mg/dL (ref 6–20)
CHLORIDE: 102 mmol/L (ref 101–111)
CO2: 30 mmol/L (ref 22–32)
Calcium: 8.6 mg/dL — ABNORMAL LOW (ref 8.9–10.3)
Creatinine, Ser: 0.9 mg/dL (ref 0.44–1.00)
GFR calc non Af Amer: 55 mL/min — ABNORMAL LOW (ref >60)
Glucose, Bld: 108 mg/dL — ABNORMAL HIGH (ref 65–99)
POTASSIUM: 3.5 mmol/L (ref 3.5–5.1)
Sodium: 138 mmol/L (ref 135–145)

## 2017-08-28 LAB — CBC
HEMATOCRIT: 23.7 % — AB (ref 35.0–47.0)
HEMOGLOBIN: 7.8 g/dL — AB (ref 12.0–16.0)
MCH: 26.9 pg (ref 26.0–34.0)
MCHC: 32.8 g/dL (ref 32.0–36.0)
MCV: 82.1 fL (ref 80.0–100.0)
Platelets: 427 10*3/uL (ref 150–440)
RBC: 2.89 MIL/uL — AB (ref 3.80–5.20)
RDW: 16.6 % — ABNORMAL HIGH (ref 11.5–14.5)
WBC: 7.2 10*3/uL (ref 3.6–11.0)

## 2017-08-28 MED ORDER — METOPROLOL SUCCINATE ER 25 MG PO TB24: 75.0000 mg | ORAL_TABLET | Freq: Every day | ORAL | 2 refills | Status: DC

## 2017-08-28 MED ORDER — OXYCODONE-ACETAMINOPHEN 5-325 MG PO TABS: 1.0000 | ORAL_TABLET | Freq: Four times a day (QID) | ORAL | 0 refills | Status: DC | PRN

## 2017-08-28 MED ORDER — LORAZEPAM 1 MG PO TABS: 1.0000 mg | ORAL_TABLET | Freq: Every evening | ORAL | 0 refills | Status: DC | PRN

## 2017-08-28 MED ORDER — SENNOSIDES-DOCUSATE SODIUM 8.6-50 MG PO TABS: 1.0000 | ORAL_TABLET | Freq: Every day | ORAL | Status: DC

## 2017-08-28 MED ORDER — POLYETHYLENE GLYCOL 3350 17 G PO PACK: 17.0000 g | PACK | Freq: Every day | ORAL | 0 refills | Status: DC | PRN

## 2017-08-28 MED ORDER — PANTOPRAZOLE SODIUM 40 MG PO TBEC: 40.0000 mg | DELAYED_RELEASE_TABLET | Freq: Every day | ORAL | 2 refills | Status: DC

## 2017-08-28 MED ORDER — TRAMADOL HCL 50 MG PO TABS: 50.0000 mg | ORAL_TABLET | Freq: Two times a day (BID) | ORAL | 0 refills | Status: AC

## 2017-08-28 MED ORDER — OXYCODONE-ACETAMINOPHEN 5-325 MG PO TABS: 1.0000 | ORAL_TABLET | Freq: Four times a day (QID) | ORAL | Status: DC | PRN

## 2017-08-28 MED ORDER — AMIODARONE HCL 200 MG PO TABS: 200.0000 mg | ORAL_TABLET | Freq: Two times a day (BID) | ORAL | 0 refills | Status: AC

## 2017-08-28 NOTE — Care Management Important Message (Signed)
Important Message  Patient Details  Name: Kristy Hardy MRN: [id number] Date of Birth: [birth year]   Medicare Important Message Given:  Yes  Signed IM notice given  Katrina Stack, RN 08/28/2017, 2:58 PM

## 2017-08-28 NOTE — Clinical Social Work Note (Signed)
CSW spoke to [facility] and they can accept her back today to room 404.  Patient to be d/c'ed today to [facility].  Patient and family agreeable to plans will transport via ems RN to call report.  Jones Broom. [place], MSW, [place]  08/28/2017 1:54 PM

## 2017-08-28 NOTE — Evaluation (Signed)
Physical Therapy Evaluation Patient Details Name: [place] Nolton MRN: [id number] DOB: 1924-08-23 Today's Date: 08/28/2017   History of Present Illness  Pt is a 84 y/o F presenting to hospital from SNF ([facility]) due to weakness and black stool 08/25/17. Pt admitted 08/25/17 w/ diagnosis of elevated troponin, GI bleed, anemia.  PMHx includes: mod aortic stenosis, AFIB, GI bleed, lumbar L1 fx w/ kyphoplasty surgery 08/17/17, breast cancer R and L, and HLD. s/p EGD 08/26/17 w/ AFIB post op.     Clinical Impression  Pt demonstrated bed mobility (CGA to min assist), transfers, and ambulation 40 ft (RW CGA). Pt fatigues quickly; HR monitored throughout session (65-77 bpm). Pt's son Elta Guadeloupe) and sister Hoyle Sauer) were present during session and report that Pt is far from baseline and hope for Pt to return to SNF in order to regain strength and activity tolerance before returning home where she lives alone. Pt would benefit from skilled PT to progress bed mobility, transfers, ambulation, activity tolerance, and optimal return to PLOF. Recommend to SNF upon discharge from acute hospitalization.    Follow Up Recommendations SNF(Pt arrived from [facility])    Clinical biochemist with 5" wheels;3in1 (PT)    Recommendations for Other Services       Precautions / Restrictions Precautions Precautions: Fall;Back Precaution Booklet Issued: No Precaution Comments: L1 fx: kyphoplasty surgery 08/17/17 Restrictions Weight Bearing Restrictions: No      Mobility  Bed Mobility Overal bed mobility: Needs Assistance Bed Mobility: Rolling Rolling: Min guard   Supine to sit: Min assist     General bed mobility comments: rolling CGA, vc's to bend knees up and roll; min assist to sit from sidelying, provided hand for Pt to pull and bring trunk up to sit. Pt reports 2/10 pain  Transfers Overall transfer level: Needs assistance Equipment used: Rolling walker (2  wheeled) Transfers: Sit to/from Stand Sit to Stand: Min guard         General transfer comment: min assist for lift, vc's for hand placement  Ambulation/Gait Ambulation/Gait assistance: Min guard Ambulation Distance (Feet): 40 Feet Assistive device: Rolling walker (2 wheeled) Gait Pattern/deviations: Step-through pattern Gait velocity: decreased   General Gait Details: Pt reports fatigue with ambulation 20 ft; Pt stated they could ambulate to recliner, Pt sat in recliner and repositioned. HR monitored throughout activity (65-77 bpm).   Stairs            Wheelchair Mobility    Modified Rankin (Stroke Patients Only)       Balance Overall balance assessment: History of Falls;Needs assistance Sitting-balance support: Feet supported;Bilateral upper extremity supported Sitting balance-Leahy Scale: Poor Sitting balance - Comments: Pt used BUE on bed to maintain sitting balance   Standing balance support: Bilateral upper extremity supported Standing balance-Leahy Scale: Poor Standing balance comment: required RW CGA                             Pertinent Vitals/Pain Pain Assessment: 0-10 Pain Score: 0-No pain Faces Pain Scale: No hurt Pain Location: low back Pain Descriptors / Indicators: Sore Pain Intervention(s): Limited activity within patient's tolerance;Monitored during session;Repositioned    Home Living Family/patient expects to be discharged to:: Skilled nursing facility Living Arrangements: Alone Available Help at Discharge: Family;Available PRN/intermittently Type of Home: House Home Access: Stairs to enter Entrance Stairs-Rails: Right;Left(cannot reach both) Entrance Stairs-Number of Steps: 2 Home Layout: One level Home Equipment: Walker - 2 wheels Additional Comments: arrived  from [facility], planning to return at discharge    Prior Function Level of Independence: Independent with assistive device(s)         Comments: Pt and  son Elta Guadeloupe report Pt used RW at [facility], but prior to first hospitalization in 3/19 that she was independent in all ADLs, driving, and volunteering at [place]: Right    Extremity/Trunk Assessment   Upper Extremity Assessment Upper Extremity Assessment: Generalized weakness;RUE deficits/detail;LUE deficits/detail RUE Deficits / Details: AROM WNL strength at least 3/5 shoulder flexion, elbow flexion/extension, supination/pronation; strength 4-/5 MMT elbow flexion/extension LUE Deficits / Details: AROM WNL strength at least 3/5 shoulder flexion, elbow flexion/extension, supination/pronation; strength 4-/5 MMT elbow flexion/extension    Lower Extremity Assessment Lower Extremity Assessment: Generalized weakness;RLE deficits/detail;LLE deficits/detail RLE Deficits / Details: AROM WNL strength at least 3/5 hip flexion, knee flexion/extension, dorsiflexion/ plantarflexion; demonstrated by AROM and standing marching  LLE Deficits / Details: AROM WNL strength at least 3/5 hip flexion, knee flexion/extension, dorsiflexion/ plantarflexion; demonstrated by  AROM and standing marching     Cervical / Trunk Assessment Cervical / Trunk Assessment: Normal  Communication   Communication: No difficulties  Cognition Arousal/Alertness: Awake/alert Behavior During Therapy: WFL for tasks assessed/performed Overall Cognitive Status: Within Functional Limits for tasks assessed                                 General Comments: Pt A&O x 4      General Comments  Pt agreeable to session    Exercises     Assessment/Plan    PT Assessment Patient needs continued PT services  PT Problem List Decreased strength;Decreased activity tolerance;Decreased balance;Decreased mobility;Decreased knowledge of use of DME;Pain;Decreased safety awareness;Decreased coordination       PT Treatment Interventions DME instruction;Balance training;Gait training;Stair  training;Functional mobility training;Therapeutic activities;Therapeutic exercise;Patient/family education    PT Goals (Current goals can be found in the Care Plan section)  Acute Rehab PT Goals Patient Stated Goal: to get stronger PT Goal Formulation: With patient Time For Goal Achievement: 09/01/17 Potential to Achieve Goals: Good    Frequency Min 2X/week   Barriers to discharge        Co-evaluation               AM-PAC PT "6 Clicks" Daily Activity  Outcome Measure Difficulty turning over in bed (including adjusting bedclothes, sheets and blankets)?: A Little Difficulty moving from lying on back to sitting on the side of the bed? : A Little Difficulty sitting down on and standing up from a chair with arms (e.g., wheelchair, bedside commode, etc,.)?: Unable Help needed moving to and from a bed to chair (including a wheelchair)?: A Little Help needed walking in hospital room?: A Little Help needed climbing 3-5 steps with a railing? : A Lot 6 Click Score: 15    End of Session Equipment Utilized During Treatment: Gait belt Activity Tolerance: Patient limited by fatigue Patient left: in chair;with chair alarm set Nurse Communication: Mobility status PT Visit Diagnosis: Muscle weakness (generalized) (M62.81);Difficulty in walking, not elsewhere classified (R26.2);Other abnormalities of gait and mobility (R26.89);Pain Pain - Right/Left: (low back) Pain - part of body: (low back)    Time: 1120-1200 PT Time Calculation (min) (ACUTE ONLY): 40 min   Charges:         PT G Codes:        Geraldine Sandberg Mondrian-Pardue, SPT  08/28/2017, 2:09 PM

## 2017-08-28 NOTE — Progress Notes (Signed)
Report called to [facility]/ iv and tele removed/ EMS called to transport.

## 2017-08-28 NOTE — Progress Notes (Addendum)
Pharmacy Electrolyte Monitoring Consult:  Pharmacy consulted to assist in monitoring and replacing electrolytes in this 84 y.o. female admitted on 08/25/2017 with Weakness and Near Syncope Patient with atrial fibrillation on amiodarone.   Labs:  Sodium (mmol/L)  Date Value  08/28/2017 138  09/01/2014 135   Potassium (mmol/L)  Date Value  08/28/2017 3.5  09/01/2014 4.1   Magnesium (mg/dL)  Date Value  08/27/2017 1.9  11/16/2012 1.8   Calcium (mg/dL)  Date Value  08/28/2017 8.6 (L)   Calcium, Total (mg/dL)  Date Value  09/01/2014 9.5   Albumin (g/dL)  Date Value  08/25/2017 3.4 (L)  09/01/2014 4.2    Plan: Patient already on Magnesium 200 mg po daily. Electrolytes WNL today  No additional replacement needed at this time.  F/u labs in am  Pernell Dupre, PharmD, BCPS Clinical Pharmacist 08/28/2017 9:40 AM

## 2017-08-28 NOTE — Discharge Summary (Signed)
Kristy Hardy at [place] NAME: Kristy Hardy    MR#:  [id number]  DATE OF BIRTH:  1925-03-13  DATE OF ADMISSION:  08/25/2017   ADMITTING PHYSICIAN: Fritzi Mandes, MD  DATE OF DISCHARGE:  08/28/17  PRIMARY CARE PHYSICIAN: [name], MD   ADMISSION DIAGNOSIS:   Elevated troponin [R74.8] Gastrointestinal hemorrhage, unspecified gastrointestinal hemorrhage type [K92.2] Other iron deficiency anemia [D50.8]  DISCHARGE DIAGNOSIS:   Active Problems:   GI bleed   SECONDARY DIAGNOSIS:   Past Medical History:  Diagnosis Date  . Arthritis   . Breast cancer ([place])   . Cancer ([place]) 11-11-91   left breast   . Cancer ([place]) 08-12-14   right breast, ER/PR positive, Her2 negative  . Carcinoma of left breast ([place])    Left breast carcinoma; patient had lumpectomy radiation therapy   . COPD (chronic obstructive pulmonary disease) ([place])   . Heart valve problem    leaking  . History of colonoscopy 2012  . History of mammogram 2016  . Pneumonia July 2014    HOSPITAL COURSE:   84 year old female with history of back pain, history of breast cancer, chronic iron deficiency anemia, hypertension presents from [place] secondary to dizziness, hypertension and noted to have melena  1. Melena- upper GI bleed - EGD with gastritis, started on protonix, stopped aspirin - If no improvement- outpatient capsule endoscopy - appreciate GI consult  2. Acute on chronic iron deficiency anemia-secondary to GI losses -Received 1 unit packed RBC transfusion this admission, hemoglobin is stable   3. New onset atrial fibrillation-in the setting of anemia and chronic aortic stenosis. -Appreciate cardiology consult. Was started on amiodarone drip, currently on oral amiodarone. Converted to normal sinus rhythm. -On Toprol dose is increased. For now hold aspirin due to GI bleed. Can be started as outpatient.  4. Hypertension-continue home medications. Patient on  metoprolol. His dose is increased, stopped Hyzaar.  5. Chronic back pain-continue pain medications  6. Hypokalemia-replaced.  Physical therapy consult appreciated. Patient will be discharged back to [place]:   Guarded  CONSULTS OBTAINED:   Treatment Team:  Efrain Sella, MD Teodoro Spray, MD  DRUG ALLERGIES:   Allergies  Allergen Reactions  . Amoxicillin-Pot Clavulanate Nausea And Vomiting and Other (See Comments)    Has patient had a PCN reaction causing immediate rash, facial/tongue/throat swelling, SOB or lightheadedness with hypotension: No Has patient had a PCN reaction causing severe rash involving mucus membranes or skin necrosis: No Has patient had a PCN reaction that required hospitalization: No Has patient had a PCN reaction occurring within the last 10 years: Unknown If all of the above answers are "NO", then may proceed with Cephalosporin use.   . Other Nausea And Vomiting  . Sulfa Antibiotics Rash   DISCHARGE MEDICATIONS:   Allergies as of 08/28/2017      Reactions   Amoxicillin-pot Clavulanate Nausea And Vomiting, Other (See Comments)   Has patient had a PCN reaction causing immediate rash, facial/tongue/throat swelling, SOB or lightheadedness with hypotension: No Has patient had a PCN reaction causing severe rash involving mucus membranes or skin necrosis: No Has patient had a PCN reaction that required hospitalization: No Has patient had a PCN reaction occurring within the last 10 years: Unknown If all of the above answers are "NO", then may proceed with Cephalosporin use.   Other Nausea And Vomiting   Sulfa Antibiotics Rash      Medication List  STOP taking these medications   aspirin 81 MG tablet   losartan-hydrochlorothiazide 50-12.5 MG tablet Commonly known as:  HYZAAR     TAKE these medications   amiodarone 200 MG tablet Commonly known as:  PACERONE Take 1 tablet (200 mg total) by mouth 2 (two) times  daily.   Ferrous Gluconate 325 (36 Fe) MG Tabs Take 1 tablet by mouth 2 (two) times daily.   fluticasone 50 MCG/ACT nasal spray Commonly known as:  FLONASE Place 1 spray into both nostrils 2 (two) times daily.   guaiFENesin 600 MG 12 hr tablet Commonly known as:  MUCINEX Take by mouth 2 (two) times daily as needed.   letrozole 2.5 MG tablet Commonly known as:  FEMARA TAKE 1 TABLET(2.5 MG) BY MOUTH DAILY   LORazepam 1 MG tablet Commonly known as:  ATIVAN Take 1 tablet (1 mg total) by mouth at bedtime as needed for anxiety.   Magnesium 250 MG Tabs Take 1 tablet by mouth daily.   metoprolol succinate 25 MG 24 hr tablet Commonly known as:  TOPROL-XL Take 3 tablets (75 mg total) by mouth daily. Take with or immediately following a meal. Start taking on:  08/29/2017 What changed:    medication strength  how much to take  additional instructions   oxyCODONE-acetaminophen 5-325 MG tablet Commonly known as:  PERCOCET/ROXICET Take 1 tablet by mouth every 6 (six) hours as needed for severe pain.   pantoprazole 40 MG tablet Commonly known as:  PROTONIX Take 1 tablet (40 mg total) by mouth daily.   polyethylene glycol packet Commonly known as:  MIRALAX / GLYCOLAX Take 17 g by mouth daily as needed for mild constipation.   PROAIR HFA 108 (90 Base) MCG/ACT inhaler Generic drug:  albuterol Inhale 1 puff into the lungs every 6 (six) hours as needed for wheezing or shortness of breath.   pseudoephedrine-guaifenesin 60-600 MG 12 hr tablet Commonly known as:  MUCINEX D Take 1 tablet by mouth every 12 (twelve) hours as needed for congestion.   RA VITAMIN B-12 TR 1000 MCG Tbcr Generic drug:  Cyanocobalamin Take 1 tablet by mouth daily.   sennosides-docusate sodium 8.6-50 MG tablet Commonly known as:  SENOKOT-S Take 2 tablets by mouth daily.   traMADol 50 MG tablet Commonly known as:  ULTRAM Take 1 tablet (50 mg total) by mouth 2 (two) times daily.        DISCHARGE  INSTRUCTIONS:   1. PCP f/u in 1-2 weeks 2. Cardiology f/u in 2 weeks  DIET:   Cardiac diet  ACTIVITY:   Activity as tolerated  OXYGEN:   Home Oxygen: No.  Oxygen Delivery: room air  DISCHARGE LOCATION:   nursing home   If you experience worsening of your admission symptoms, develop shortness of breath, life threatening emergency, suicidal or homicidal thoughts you must seek medical attention immediately by calling 911 or calling your MD immediately  if symptoms less severe.  You Must read complete instructions/literature along with all the possible adverse reactions/side effects for all the Medicines you take and that have been prescribed to you. Take any new Medicines after you have completely understood and accpet all the possible adverse reactions/side effects.   Please note  You were cared for by a hospitalist during your hospital stay. If you have any questions about your discharge medications or the care you received while you were in the hospital after you are discharged, you can call the unit and asked to speak with the hospitalist on call if  the hospitalist that took care of you is not available. Once you are discharged, your primary care physician will handle any further medical issues. Please note that NO REFILLS for any discharge medications will be authorized once you are discharged, as it is imperative that you return to your primary care physician (or establish a relationship with a primary care physician if you do not have one) for your aftercare needs so that they can reassess your need for medications and monitor your lab values.    On the day of Discharge:  VITAL SIGNS:   Blood pressure (!) 153/62, pulse 77, temperature 98 F (36.7 C), temperature source Oral, resp. rate 18, height '5\' 5"'  (1.651 m), weight 70.3 kg (154 lb 14.4 oz), SpO2 97 %.  PHYSICAL EXAMINATION:    GENERAL:  84 y.o.-year-old elderly patient lying in the bed with no acute distress.  EYES:  Pupils equal, round, reactive to light and accommodation. No scleral icterus. Extraocular muscles intact.  HEENT: Head atraumatic, normocephalic. Oropharynx and nasopharynx clear.  NECK:  Supple, no jugular venous distention. No thyroid enlargement, no tenderness.  LUNGS: Normal breath sounds bilaterally, no wheezing, rales,rhonchi or crepitation. No use of accessory muscles of respiration.  CARDIOVASCULAR: S1, S2 normal. No  rubs, or gallops. 3/6 loud systolic murmur present ABDOMEN: Soft, non-tender, non-distended. Bowel sounds present. No organomegaly or mass.  EXTREMITIES: No pedal edema, cyanosis, or clubbing.  NEUROLOGIC: Cranial nerves II through XII are intact. Muscle strength 5/5 in all extremities. Sensation intact. Gait not checked.  PSYCHIATRIC: The patient is alert and oriented x 3.  SKIN: No obvious rash, lesion, or ulcer.   DATA REVIEW:   CBC Recent Labs  Lab 08/28/17 0439  WBC 7.2  HGB 7.8*  HCT 23.7*  PLT 427    Chemistries  Recent Labs  Lab 08/25/17 1356  08/27/17 0435  08/28/17 0439  NA 135   < > 136  --  138  K 3.4*   < > 3.1*   < > 3.5  CL 98*   < > 101  --  102  CO2 25   < > 27  --  30  GLUCOSE 134*   < > 105*  --  108*  BUN 39*   < > 22*  --  17  CREATININE 1.28*   < > 0.96  --  0.90  CALCIUM 8.8*   < > 8.2*  --  8.6*  MG  --    < > 1.9  --   --   AST 31  --   --   --   --   ALT 14  --   --   --   --   ALKPHOS 79  --   --   --   --   BILITOT 0.6  --   --   --   --    < > = values in this interval not displayed.     Microbiology Results  Results for orders placed or performed during the hospital encounter of 08/16/17  Surgical pcr screen     Status: None   Collection Time: 08/17/17 12:18 AM  Result Value Ref Range Status   MRSA, PCR NEGATIVE NEGATIVE Final   Staphylococcus aureus NEGATIVE NEGATIVE Final    Comment: (NOTE) The Xpert SA Assay (FDA approved for NASAL specimens in patients 93 years of age and older), is one component of a  comprehensive surveillance program. It is not intended to diagnose infection nor  to guide or monitor treatment. Performed at [facility], [address] [place], [place] [address]     RADIOLOGY:  No results found.   Management plans discussed with the patient, family and they are in agreement.  CODE STATUS:     Code Status Orders  (From admission, onward)        Start     Ordered   08/25/17 2016  Full code  Continuous     08/25/17 2016    Code Status History    Date Active Date Inactive Code Status Order ID Comments User Context   08/16/2017 2348 08/19/2017 1906 Full Code [id number]  Amelia Jo, MD Inpatient      TOTAL TIME TAKING CARE OF THIS PATIENT: 38 minutes.    Gladstone Lighter M.D on 08/28/2017 at 1:54 PM  Between 7am to 6pm - Pager - [phone]  After 6pm go to [email or web address] - Proofreader  [facility]  Office  [phone]  CC: Primary care physician; [name], MD   Note: This dictation was prepared with Dragon dictation along with smaller phrase technology. Any transcriptional errors that result from this process are unintentional.

## 2017-08-29 ENCOUNTER — Encounter: Payer: Self-pay | Admitting: Internal Medicine

## 2017-08-29 LAB — BPAM RBC
BLOOD PRODUCT EXPIRATION DATE: 201904302359
Blood Product Expiration Date: 201905042359
Blood Product Expiration Date: 201905082359
ISSUE DATE / TIME: 201904061021
UNIT TYPE AND RH: 5100
UNIT TYPE AND RH: 5100
Unit Type and Rh: 5100

## 2017-08-29 LAB — TYPE AND SCREEN
ABO/RH(D): O POS
Antibody Screen: NEGATIVE
Unit division: 0
Unit division: 0
Unit division: 0

## 2017-08-29 LAB — SURGICAL PATHOLOGY

## 2017-08-29 LAB — PREPARE RBC (CROSSMATCH)

## 2017-09-06 ENCOUNTER — Encounter: Payer: Self-pay | Admitting: Internal Medicine

## 2017-11-17 ENCOUNTER — Other Ambulatory Visit: Payer: Self-pay | Admitting: *Deleted

## 2017-11-17 NOTE — Telephone Encounter (Signed)
Daughter called stating patient needs her Letrozole refilled. This patient cancelled her last appointment in Nov and did not reschedule it, She has no follow up appts with Dr B either. She currently resides in [place] and the daughter requests we call her with any communication, but she is NOT on the HIPPA form release of information, only the son Elta Guadeloupe is listed. Please advise what you want to do.

## 2017-11-17 NOTE — Telephone Encounter (Signed)
Called son to discuss that Kristy Hardy called and that we cannot speak with her and also that due to patient not being seen in over a year and has no follow up appointment, we cannot refill her medicine. He stated he understood and asked if he could schedule an appointment for her and I transferred him to [facility] for an appointment.

## 2017-11-20 ENCOUNTER — Encounter: Payer: Self-pay | Admitting: Internal Medicine

## 2017-11-20 ENCOUNTER — Other Ambulatory Visit: Payer: Self-pay

## 2017-11-20 ENCOUNTER — Inpatient Hospital Stay: Payer: Medicare Other

## 2017-11-20 ENCOUNTER — Inpatient Hospital Stay: Payer: Medicare Other | Attending: Internal Medicine | Admitting: Internal Medicine

## 2017-11-20 VITALS — BP 139/59 | HR 55 | Temp 97.6°F | Resp 20 | Ht 65.0 in | Wt 159.0 lb

## 2017-11-20 DIAGNOSIS — D500 Iron deficiency anemia secondary to blood loss (chronic): Secondary | ICD-10-CM | POA: Insufficient documentation

## 2017-11-20 DIAGNOSIS — Z853 Personal history of malignant neoplasm of breast: Secondary | ICD-10-CM | POA: Diagnosis not present

## 2017-11-20 DIAGNOSIS — Z170 Estrogen receptor positive status [ER+]: Secondary | ICD-10-CM | POA: Insufficient documentation

## 2017-11-20 DIAGNOSIS — K254 Chronic or unspecified gastric ulcer with hemorrhage: Secondary | ICD-10-CM | POA: Diagnosis not present

## 2017-11-20 DIAGNOSIS — C50211 Malignant neoplasm of upper-inner quadrant of right female breast: Secondary | ICD-10-CM | POA: Diagnosis not present

## 2017-11-20 DIAGNOSIS — K5903 Drug induced constipation: Secondary | ICD-10-CM | POA: Diagnosis not present

## 2017-11-20 DIAGNOSIS — Z923 Personal history of irradiation: Secondary | ICD-10-CM | POA: Diagnosis not present

## 2017-11-20 DIAGNOSIS — Z87440 Personal history of urinary (tract) infections: Secondary | ICD-10-CM | POA: Diagnosis not present

## 2017-11-20 LAB — CBC WITH DIFFERENTIAL/PLATELET
BASOS PCT: 2 %
Basophils Absolute: 0.1 10*3/uL (ref 0–0.1)
EOS ABS: 0.2 10*3/uL (ref 0–0.7)
EOS PCT: 2 %
HCT: 35 % (ref 35.0–47.0)
Hemoglobin: 11.4 g/dL — ABNORMAL LOW (ref 12.0–16.0)
Lymphocytes Relative: 26 %
Lymphs Abs: 1.9 10*3/uL (ref 1.0–3.6)
MCH: 27.9 pg (ref 26.0–34.0)
MCHC: 32.6 g/dL (ref 32.0–36.0)
MCV: 85.7 fL (ref 80.0–100.0)
MONOS PCT: 11 %
Monocytes Absolute: 0.8 10*3/uL (ref 0.2–0.9)
Neutro Abs: 4.3 10*3/uL (ref 1.4–6.5)
Neutrophils Relative %: 59 %
Platelets: 285 10*3/uL (ref 150–440)
RBC: 4.09 MIL/uL (ref 3.80–5.20)
RDW: 22.6 % — AB (ref 11.5–14.5)
WBC: 7.3 10*3/uL (ref 3.6–11.0)

## 2017-11-20 LAB — COMPREHENSIVE METABOLIC PANEL
ALK PHOS: 70 U/L (ref 38–126)
ALT: 18 U/L (ref 0–44)
AST: 26 U/L (ref 15–41)
Albumin: 4.1 g/dL (ref 3.5–5.0)
Anion gap: 9 (ref 5–15)
BUN: 21 mg/dL (ref 8–23)
CALCIUM: 9.2 mg/dL (ref 8.9–10.3)
CO2: 25 mmol/L (ref 22–32)
CREATININE: 0.95 mg/dL (ref 0.44–1.00)
Chloride: 101 mmol/L (ref 98–111)
GFR calc non Af Amer: 51 mL/min — ABNORMAL LOW (ref >60)
GFR, EST AFRICAN AMERICAN: 59 mL/min — AB (ref >60)
GLUCOSE: 96 mg/dL (ref 70–99)
Potassium: 4 mmol/L (ref 3.5–5.1)
SODIUM: 135 mmol/L (ref 135–145)
Total Bilirubin: 0.4 mg/dL (ref 0.3–1.2)
Total Protein: 7.6 g/dL (ref 6.5–8.1)

## 2017-11-20 LAB — IRON AND TIBC
IRON: 285 ug/dL — AB (ref 28–170)
Saturation Ratios: 68 % — ABNORMAL HIGH (ref 10.4–31.8)
TIBC: 417 ug/dL (ref 250–450)
UIBC: 132 ug/dL

## 2017-11-20 LAB — LACTATE DEHYDROGENASE: LDH: 178 U/L (ref 98–192)

## 2017-11-20 LAB — FERRITIN: Ferritin: 49 ng/mL (ref 11–307)

## 2017-11-20 MED ORDER — LETROZOLE 2.5 MG PO TABS: 2.5000 mg | ORAL_TABLET | Freq: Every day | ORAL | 0 refills | Status: DC

## 2017-11-20 NOTE — Assessment & Plan Note (Addendum)
#   Breast cancer- Stage I- ER/PR positive; Her 2 neu- Neg; on femara; ca+vit D. STABLE. Recommend starting Femara today.   #Upper GIB- EGD/[place]-Dr.Toledo.  Hemoglobin currently stable June 2019- 10.8; constipation with PO  # Frequent UTIs- awaiting Urology evaluation.   # labs-today/ CBC/cmp/iron studies/ferritin/LDH; venofer weekly x4- starting next week.

## 2017-11-20 NOTE — Progress Notes (Signed)
[facility] OFFICE PROGRESS NOTE  Patient Care Team: [name], MD as PCP - General (Family Medicine) Christene Lye, MD (General Surgery) Forest Gleason, MD (Inactive) (Oncology)  Cancer Staging Carcinoma of right breast [facility]) Staging form: Breast, AJCC 7th Edition - Clinical stage from 09/19/2014: Stage IA (yT1c, N0, M0) - Unsigned    Oncology History   # March, 2016- LEFT BREAST INVASIVE CA;  Lumpec & sentinel lymph node biopsy [Dr.sankar].  Low-grade tumor estrogen and progesterone receptor positive HER-2 receptor negative T1 cN0 M0.STAGE I S/p RT. Femara  # 2.  Left breast carcinoma breast 22 years ago patient had lumpectomy radiation therapy and 2 years of tamoxifen.  # IDA chronic-bleedin ulcer s/p EGD [facility] 2019- Dr.Toledo]     Carcinoma of right breast ([place])   10/12/2014 Initial Diagnosis    Carcinoma of left breast       Carcinoma of upper-inner quadrant of right breast in female, estrogen receptor positive ([place])      INTERVAL HISTORY:  Kristy Hardy 84 y.o.  female pleasant patient above history of early stage breast cancer was recently admitted to the hospital for upper GI bleed.   Patient status post upper endoscopy; noted to have a bleeding ulcer.  Patient is currently on iron pills causing constipation.  Otherwise denies any blood in stools.  Patient also complains of frequent UTIs for which she has been referred to urology.  Awaiting evaluation.  Review of Systems  Constitutional: Positive for malaise/fatigue. Negative for chills, diaphoresis, fever and weight loss.  HENT: Negative for nosebleeds and sore throat.   Eyes: Negative for double vision.  Respiratory: Negative for cough, hemoptysis, sputum production, shortness of breath and wheezing.   Cardiovascular: Negative for chest pain, palpitations, orthopnea and leg swelling.  Gastrointestinal: Negative for abdominal pain, blood in stool, constipation, diarrhea,  heartburn, melena, nausea and vomiting.  Genitourinary: Negative for dysuria, frequency and urgency.  Musculoskeletal: Positive for back pain and joint pain.  Skin: Negative.  Negative for itching and rash.  Neurological: Positive for dizziness. Negative for tingling, focal weakness, weakness and headaches.  Endo/Heme/Allergies: Does not bruise/bleed easily.  Psychiatric/Behavioral: Negative for depression. The patient is not nervous/anxious and does not have insomnia.       PAST MEDICAL HISTORY :  Past Medical History:  Diagnosis Date  . Arthritis   . Breast cancer ([place])   . Cancer ([place]) 11-11-91   left breast   . Cancer ([place]) 08-12-14   right breast, ER/PR positive, Her2 negative  . Carcinoma of left breast ([place])    Left breast carcinoma; patient had lumpectomy radiation therapy   . COPD (chronic obstructive pulmonary disease) ([place])   . Heart valve problem    leaking  . History of colonoscopy 2012  . History of mammogram 2016  . Pneumonia July 2014    PAST SURGICAL HISTORY :   Past Surgical History:  Procedure Laterality Date  . BREAST BIOPSY Bilateral [id number]   Positive  . BREAST SURGERY Left 11-11-91   lumpectomy with radiation, Tamoxifen for 2 years.Dr. Sharlet Salina  . BREAST SURGERY Right 09/09/14   RIGHT BREAST WIDE EXCISION   . CHOLECYSTECTOMY  02/23/1972  . COLONOSCOPY    . COLONOSCOPY WITH PROPOFOL N/A 08/27/2017   Procedure: COLONOSCOPY WITH PROPOFOL;  Surgeon: Toledo, Benay Pike, MD;  Location: [facility] ENDOSCOPY;  Service: Gastroenterology;  Laterality: N/A;  . ESOPHAGOGASTRODUODENOSCOPY (EGD) WITH PROPOFOL N/A 08/26/2017   Procedure: ESOPHAGOGASTRODUODENOSCOPY (EGD) WITH PROPOFOL;  Surgeon: Calabasas, Ojai,  MD;  Location: [facility] ENDOSCOPY;  Service: Gastroenterology;  Laterality: N/A;  . KYPHOPLASTY N/A 08/17/2017   Procedure: [id number];  Surgeon: Hessie Knows, MD;  Location: [facility] ORS;  Service: Orthopedics;  Laterality: N/A;    FAMILY HISTORY :   Family History   Problem Relation Age of Onset  . Colon cancer Mother 42  . Colon cancer Brother 54  . Breast cancer Sister 4    SOCIAL HISTORY:   Social History   Tobacco Use  . Smoking status: Never Smoker  . Smokeless tobacco: Never Used  Substance Use Topics  . Alcohol use: No    Alcohol/week: 0.0 oz  . Drug use: No    ALLERGIES:  is allergic to amoxicillin-pot clavulanate; other; and sulfa antibiotics.  MEDICATIONS:  Current Outpatient Medications  Medication Sig Dispense Refill  . acetaminophen (TYLENOL) 650 MG CR tablet Take 1 tablet by mouth daily.    Marland Kitchen amiodarone (PACERONE) 200 MG tablet Take 1 tablet (200 mg total) by mouth 2 (two) times daily. 60 tablet 0  . amLODipine (NORVASC) 5 MG tablet Take 1 tablet by mouth daily.    Marland Kitchen letrozole (FEMARA) 2.5 MG tablet Take 1 tablet (2.5 mg total) by mouth daily. 270 tablet 0  . levETIRAcetam (KEPPRA) 1000 MG tablet Take 1 tablet by mouth daily.    . Magnesium 250 MG TABS Take 1 tablet by mouth daily.     . metoprolol tartrate (LOPRESSOR) 25 MG tablet Take 25 mg by mouth daily.    . pantoprazole (PROTONIX) 40 MG tablet Take 1 tablet (40 mg total) by mouth daily. 30 tablet 2  . PROAIR HFA 108 (90 Base) MCG/ACT inhaler Inhale 1 puff into the lungs every 6 (six) hours as needed for wheezing or shortness of breath.     . simvastatin (ZOCOR) 20 MG tablet Take 1 tablet by mouth daily.    . traMADol (ULTRAM) 50 MG tablet Take 1 tablet (50 mg total) by mouth 2 (two) times daily. (Patient taking differently: Take 50 mg by mouth daily. ) 20 tablet 0   No current facility-administered medications for this visit.     PHYSICAL EXAMINATION: ECOG PERFORMANCE STATUS: 0 - Asymptomatic  BP (!) 139/59 (Patient Position: Sitting)   Pulse (!) 55   Temp 97.6 F (36.4 C) (Tympanic)   Resp 20   Ht '5\' 5"'  (1.651 m)   Wt 159 lb (72.1 kg)   BMI 26.46 kg/m   Filed Weights   11/20/17 1023  Weight: 159 lb (72.1 kg)    GENERAL: Well-nourished  well-developed; Alert, no distress and comfortable.  /Accompanied by family.  EYES: no pallor or icterus OROPHARYNX: no thrush or ulceration; NECK: supple; no lymph nodes felt. LYMPH:  no palpable lymphadenopathy in the axillary or inguinal regions LUNGS: Decreased breath sounds auscultation bilaterally. No wheeze or crackles HEART/CVS: regular rate & rhythm and no murmurs; No lower extremity edema ABDOMEN:abdomen soft, non-tender and normal bowel sounds. No hepatomegaly or splenomegaly.  Musculoskeletal:no cyanosis of digits and no clubbing  PSYCH: alert & oriented x 3 with fluent speech NEURO: no focal motor/sensory deficits SKIN:  no rashes or significant lesions    LABORATORY DATA:  I have reviewed the data as listed    Component Value Date/Time   NA 135 11/20/2017 1144   NA 135 09/01/2014 1506   K 4.0 11/20/2017 1144   K 4.1 09/01/2014 1506   CL 101 11/20/2017 1144   CL 98 (L) 09/01/2014 1506   CO2 25 11/20/2017  1144   CO2 30 09/01/2014 1506   GLUCOSE 96 11/20/2017 1144   GLUCOSE 114 (H) 09/01/2014 1506   BUN 21 11/20/2017 1144   BUN 19 09/01/2014 1506   CREATININE 0.95 11/20/2017 1144   CREATININE 0.91 09/01/2014 1506   CALCIUM 9.2 11/20/2017 1144   CALCIUM 9.5 09/01/2014 1506   PROT 7.6 11/20/2017 1144   PROT 7.8 09/01/2014 1506   ALBUMIN 4.1 11/20/2017 1144   ALBUMIN 4.2 09/01/2014 1506   AST 26 11/20/2017 1144   AST 29 09/01/2014 1506   ALT 18 11/20/2017 1144   ALT 19 09/01/2014 1506   ALKPHOS 70 11/20/2017 1144   ALKPHOS 65 09/01/2014 1506   BILITOT 0.4 11/20/2017 1144   BILITOT 0.5 09/01/2014 1506   GFRNONAA 51 (L) 11/20/2017 1144   GFRNONAA 57 (L) 09/01/2014 1506   GFRAA 59 (L) 11/20/2017 1144   GFRAA >60 09/01/2014 1506    No results found for: SPEP, UPEP  Lab Results  Component Value Date   WBC 7.3 11/20/2017   NEUTROABS 4.3 11/20/2017   HGB 11.4 (L) 11/20/2017   HCT 35.0 11/20/2017   MCV 85.7 11/20/2017   PLT 285 11/20/2017       Chemistry      Component Value Date/Time   NA 135 11/20/2017 1144   NA 135 09/01/2014 1506   K 4.0 11/20/2017 1144   K 4.1 09/01/2014 1506   CL 101 11/20/2017 1144   CL 98 (L) 09/01/2014 1506   CO2 25 11/20/2017 1144   CO2 30 09/01/2014 1506   BUN 21 11/20/2017 1144   BUN 19 09/01/2014 1506   CREATININE 0.95 11/20/2017 1144   CREATININE 0.91 09/01/2014 1506      Component Value Date/Time   CALCIUM 9.2 11/20/2017 1144   CALCIUM 9.5 09/01/2014 1506   ALKPHOS 70 11/20/2017 1144   ALKPHOS 65 09/01/2014 1506   AST 26 11/20/2017 1144   AST 29 09/01/2014 1506   ALT 18 11/20/2017 1144   ALT 19 09/01/2014 1506   BILITOT 0.4 11/20/2017 1144   BILITOT 0.5 09/01/2014 1506       RADIOGRAPHIC STUDIES: I have personally reviewed the radiological images as listed and agreed with the findings in the report. No results found.   ASSESSMENT & PLAN:  Carcinoma of upper-inner quadrant of right breast in female, estrogen receptor positive ([place]) # Breast cancer- Stage I- ER/PR positive; Her 2 neu- Neg; on femara; ca+vit D. STABLE. Recommend starting Femara today.   #Upper GIB- EGD/[place]-Dr.Toledo.  Hemoglobin currently stable June 2019- 10.8; constipation with PO  # Frequent UTIs- awaiting Urology evaluation.   # labs-today/ CBC/cmp/iron studies/ferritin/LDH; venofer weekly x4- starting next week.    Orders Placed This Encounter  Procedures  . CBC with Differential/Platelet    Standing Status:   Future    Number of Occurrences:   1    Standing Expiration Date:   11/21/2018  . Comprehensive metabolic panel    Standing Status:   Future    Number of Occurrences:   1    Standing Expiration Date:   11/21/2018  . Iron and TIBC    Standing Status:   Future    Number of Occurrences:   1    Standing Expiration Date:   11/21/2018  . Ferritin    Standing Status:   Future    Number of Occurrences:   1    Standing Expiration Date:   11/21/2018  . Lactate dehydrogenase    Standing Status:  Future     Number of Occurrences:   1    Standing Expiration Date:   11/21/2018  . CBC with Differential/Platelet    Standing Status:   Future    Standing Expiration Date:   11/21/2018   All questions were answered. The patient knows to call the clinic with any problems, questions or concerns.      Cammie Sickle, MD 11/23/2017 7:43 PM

## 2017-11-20 NOTE — Progress Notes (Signed)
Patient has no showed for multiple apts due to fall approximately 8 months ago. She needs a RF on her femara. Has 1 tablet left. Pt is requesting that her DPR be updated to include her daughter's name.

## 2017-11-27 ENCOUNTER — Inpatient Hospital Stay: Payer: Medicare Other

## 2017-11-27 VITALS — BP 126/68 | HR 63 | Temp 98.5°F | Resp 20

## 2017-11-27 DIAGNOSIS — D500 Iron deficiency anemia secondary to blood loss (chronic): Secondary | ICD-10-CM

## 2017-11-27 DIAGNOSIS — Z170 Estrogen receptor positive status [ER+]: Principal | ICD-10-CM

## 2017-11-27 DIAGNOSIS — C50211 Malignant neoplasm of upper-inner quadrant of right female breast: Secondary | ICD-10-CM | POA: Diagnosis not present

## 2017-11-27 MED ORDER — SODIUM CHLORIDE 0.9 % IV SOLN
Freq: Once | INTRAVENOUS | Status: AC
  Administered 2017-11-27: 14:00:00 via INTRAVENOUS
  Filled 2017-11-27: qty 1000

## 2017-11-27 MED ORDER — IRON SUCROSE 20 MG/ML IV SOLN
200.0000 mg | Freq: Once | INTRAVENOUS | Status: AC
  Administered 2017-11-27: 200 mg via INTRAVENOUS
  Filled 2017-11-27: qty 10

## 2017-12-04 ENCOUNTER — Inpatient Hospital Stay: Payer: Medicare Other

## 2017-12-04 VITALS — BP 163/71 | HR 65 | Temp 96.5°F | Resp 20

## 2017-12-04 DIAGNOSIS — D500 Iron deficiency anemia secondary to blood loss (chronic): Secondary | ICD-10-CM

## 2017-12-04 DIAGNOSIS — C50211 Malignant neoplasm of upper-inner quadrant of right female breast: Secondary | ICD-10-CM | POA: Diagnosis not present

## 2017-12-04 DIAGNOSIS — Z170 Estrogen receptor positive status [ER+]: Principal | ICD-10-CM

## 2017-12-04 MED ORDER — SODIUM CHLORIDE 0.9 % IV SOLN
Freq: Once | INTRAVENOUS | Status: AC
  Administered 2017-12-04: 14:00:00 via INTRAVENOUS
  Filled 2017-12-04: qty 1000

## 2017-12-04 MED ORDER — IRON SUCROSE 20 MG/ML IV SOLN
200.0000 mg | Freq: Once | INTRAVENOUS | Status: AC
Start: 2017-12-04 — End: 2017-12-04
  Administered 2017-12-04: 200 mg via INTRAVENOUS
  Filled 2017-12-04: qty 10

## 2017-12-06 ENCOUNTER — Ambulatory Visit
Admission: RE | Admit: 2017-12-06 | Discharge: 2017-12-06 | Disposition: A | Discharge status: Home / Self Care | Payer: Medicare Other | Source: Ambulatory Visit | Attending: General Surgery | Admitting: General Surgery

## 2017-12-06 DIAGNOSIS — Z170 Estrogen receptor positive status [ER+]: Secondary | ICD-10-CM | POA: Insufficient documentation

## 2017-12-06 DIAGNOSIS — C50211 Malignant neoplasm of upper-inner quadrant of right female breast: Secondary | ICD-10-CM

## 2017-12-06 HISTORY — DX: Personal history of irradiation: Z92.3

## 2017-12-11 ENCOUNTER — Inpatient Hospital Stay: Payer: Medicare Other

## 2017-12-11 VITALS — BP 157/74 | HR 70 | Resp 20

## 2017-12-11 DIAGNOSIS — Z170 Estrogen receptor positive status [ER+]: Principal | ICD-10-CM

## 2017-12-11 DIAGNOSIS — D500 Iron deficiency anemia secondary to blood loss (chronic): Secondary | ICD-10-CM

## 2017-12-11 DIAGNOSIS — C50211 Malignant neoplasm of upper-inner quadrant of right female breast: Secondary | ICD-10-CM | POA: Diagnosis not present

## 2017-12-11 MED ORDER — IRON SUCROSE 20 MG/ML IV SOLN
200.0000 mg | Freq: Once | INTRAVENOUS | Status: AC
  Administered 2017-12-11: 200 mg via INTRAVENOUS
  Filled 2017-12-11: qty 10

## 2017-12-11 MED ORDER — SODIUM CHLORIDE 0.9 % IV SOLN
Freq: Once | INTRAVENOUS | Status: AC
  Administered 2017-12-11: 14:00:00 via INTRAVENOUS
  Filled 2017-12-11: qty 1000

## 2017-12-13 ENCOUNTER — Ambulatory Visit: Payer: Self-pay | Admitting: Urology

## 2017-12-14 ENCOUNTER — Encounter: Payer: Self-pay | Admitting: General Surgery

## 2017-12-14 ENCOUNTER — Ambulatory Visit (INDEPENDENT_AMBULATORY_CARE_PROVIDER_SITE_OTHER): Payer: Medicare Other | Admitting: General Surgery

## 2017-12-14 VITALS — BP 150/72 | HR 57 | Resp 12 | Ht 65.0 in | Wt 161.0 lb

## 2017-12-14 DIAGNOSIS — Z170 Estrogen receptor positive status [ER+]: Secondary | ICD-10-CM

## 2017-12-14 DIAGNOSIS — C50211 Malignant neoplasm of upper-inner quadrant of right female breast: Secondary | ICD-10-CM

## 2017-12-14 NOTE — Patient Instructions (Addendum)
The patient is aware to call back for any questions or concerns. The patient has been asked to return to the office in one year with a bilateral diagnostic mammogram. 

## 2017-12-14 NOTE — Progress Notes (Signed)
Patient ID: Kristy Hardy, female   DOB: 1924-11-01, 84 y.o.   MRN: [id number]  Chief Complaint  Patient presents with  . Follow-up    HPI Kristy Hardy is a 84 y.o. female.  who presents for her breast cancer follow up and a breast evaluation. The most recent mammogram was done on 12-06-17.  Patient does perform regular self breast checks and gets regular mammograms done.   She fell and fractured her back in March, she had surgery and is recovering well. Episode of GI bleed in April with transfusions. She is here with her daughter, Kristy Hardy. HPI  Past Medical History:  Diagnosis Date  . Arthritis   . Atherosclerosis 2019   Dr Ubaldo Glassing  . Breast cancer ([place])   . Cancer ([place]) 11-11-91   left breast   . Cancer ([place]) 08-12-14   right breast, ER/PR positive, Her2 negative  . COPD (chronic obstructive pulmonary disease) ([place])   . Heart valve problem    leaking  . History of colonoscopy 2012  . History of mammogram 2016  . Personal history of radiation therapy 2016   Rt breast  . Personal history of radiation therapy 1993   Left breast  . Pneumonia July 2014    Past Surgical History:  Procedure Laterality Date  . BREAST BIOPSY Bilateral [id number]   Positive  . BREAST SURGERY Left 11-11-91   lumpectomy with radiation, Tamoxifen for 2 years.Dr. Sharlet Salina  . BREAST SURGERY Right 09/09/14   RIGHT BREAST WIDE EXCISION   . CHOLECYSTECTOMY  02/23/1972  . COLONOSCOPY    . COLONOSCOPY WITH PROPOFOL N/A 08/27/2017   Procedure: COLONOSCOPY WITH PROPOFOL;  Surgeon: Toledo, Benay Pike, MD;  Location: [facility] END[facility]COPY;  Service: Gastroenterology;  Laterality: N/A;  . ESOPHAGOGASTRODUODENOSCOPY (EGD) WITH PROPOFOL N/A 08/26/2017   Procedure: ESOPHAGOGASTRODUODENOSCOPY (EGD) WITH PROPOFOL;  Surgeon: Toledo, Benay Pike, MD;  Location: [facility] ENDOSCOPY;  Service: Gastroenterology;  Laterality: N/A;  . KYPHOPLASTY N/A 08/17/2017   Procedure: [id number];  Surgeon: Hessie Knows, MD;   Location: [facility] ORS;  Service: Orthopedics;  Laterality: N/A;    Family History  Problem Relation Age of Onset  . Colon cancer Mother 47  . Colon cancer Brother 22  . Breast cancer Sister 22    Social History Social History   Tobacco Use  . Smoking status: Never Smoker  . Smokeless tobacco: Never Used  Substance Use Topics  . Alcohol use: No    Alcohol/week: 0.0 oz  . Drug use: No    Allergies  Allergen Reactions  . Amoxicillin-Pot Clavulanate Nausea And Vomiting and Other (See Comments)    Has patient had a PCN reaction causing immediate rash, facial/tongue/throat swelling, SOB or lightheadedness with hypotension: No Has patient had a PCN reaction causing severe rash involving mucus membranes or skin necrosis: No Has patient had a PCN reaction that required hospitalization: No Has patient had a PCN reaction occurring within the last 10 years: Unknown If all of the above answers are "NO", then may proceed with Cephalosporin use.   . Other Nausea And Vomiting  . Sulfa Antibiotics Rash    Current Outpatient Medications  Medication Sig Dispense Refill  . acetaminophen (TYLENOL) 650 MG CR tablet Take 1 tablet by mouth daily.    Marland Kitchen amiodarone (PACERONE) 200 MG tablet Take 1 tablet (200 mg total) by mouth 2 (two) times daily. 60 tablet 0  . amLODipine (NORVASC) 5 MG tablet Take 1 tablet by mouth daily.    . Iron, Ferrous Gluconate,  256 (28 Fe) MG TABS Take by mouth daily.    Marland Kitchen letrozole (FEMARA) 2.5 MG tablet Take 1 tablet (2.5 mg total) by mouth daily. 270 tablet 0  . levETIRAcetam (KEPPRA) 1000 MG tablet Take 1 tablet by mouth daily.    . Magnesium 250 MG TABS Take 1 tablet by mouth daily.     . metoprolol tartrate (LOPRESSOR) 25 MG tablet Take 25 mg by mouth daily.    . pantoprazole (PROTONIX) 40 MG tablet Take 1 tablet (40 mg total) by mouth daily. 30 tablet 2  . PROAIR HFA 108 (90 Base) MCG/ACT inhaler Inhale 1 puff into the lungs every 6 (six) hours as needed for wheezing  or shortness of breath.     . simvastatin (ZOCOR) 20 MG tablet Take 1 tablet by mouth daily.    . traMADol (ULTRAM) 50 MG tablet Take 1 tablet (50 mg total) by mouth 2 (two) times daily. (Patient taking differently: Take 50 mg by mouth daily. ) 20 tablet 0   No current facility-administered medications for this visit.     Review of Systems Review of Systems  Constitutional: Negative.   Respiratory: Negative.   Cardiovascular: Negative.     Blood pressure (!) 150/72, pulse (!) 57, resp. rate 12, height _0  (1.651 m), weight 161 lb (73 kg), SpO2 97 %.  Physical Exam Physical Exam  Constitutional: She is oriented to person, place, and time. She appears well-developed and well-nourished.  HENT:  Mouth/Throat: Oropharynx is clear and moist.  Eyes: Conjunctivae are normal. No scleral icterus.  Neck: Neck supple.  Cardiovascular: Normal rate and regular rhythm.  Murmur heard.  Systolic murmur is present with a grade of 3/6. Pulmonary/Chest: Effort normal and breath sounds normal. Right breast exhibits skin change (patchy erythema involving the central breast.  Nontender.  No warmth.  Photo in media.). Right breast exhibits no inverted nipple, no mass, no nipple discharge and no tenderness. Left breast exhibits no inverted nipple, no mass, no nipple discharge, no skin change and no tenderness.  Lymphadenopathy:    She has no cervical adenopathy.    She has no axillary adenopathy.  Neurological: She is alert and oriented to person, place, and time.  Skin: Skin is warm and dry.  Psychiatric: Her behavior is normal.    Data Reviewed Bilateral mammograms dated December 06, 2017 were reviewed.  BI-RADS-2.  Assessment    Moderate dermal changes 3 years post breast conservation.  Clinically negative exam and negative mammogram.    Plan    The patient has been asked to return to the office in one year with a bilateral diagnostic mammogram.      HPI, Physical Exam, Assessment and Plan  have been scribed under the direction and in the presence of Robert Bellow, MD. Karie Fetch, RN  I have completed the exam and reviewed the above documentation for accuracy and completeness.  I agree with the above.  Haematologist has been used and any errors in dictation or transcription are unintentional.  Hervey Ard, M.D., F.A.C.S.  Kristy Hardy 12/15/2017, 6:07 PM

## 2017-12-18 ENCOUNTER — Inpatient Hospital Stay: Payer: Medicare Other

## 2017-12-18 DIAGNOSIS — D500 Iron deficiency anemia secondary to blood loss (chronic): Secondary | ICD-10-CM

## 2017-12-18 DIAGNOSIS — Z170 Estrogen receptor positive status [ER+]: Principal | ICD-10-CM

## 2017-12-18 DIAGNOSIS — C50211 Malignant neoplasm of upper-inner quadrant of right female breast: Secondary | ICD-10-CM

## 2017-12-18 MED ORDER — SODIUM CHLORIDE 0.9 % IV SOLN
Freq: Once | INTRAVENOUS | Status: DC
  Filled 2017-12-18: qty 1000

## 2017-12-18 MED ORDER — IRON SUCROSE 20 MG/ML IV SOLN
200.0000 mg | Freq: Once | INTRAVENOUS | Status: DC
  Filled 2017-12-18: qty 10

## 2017-12-20 ENCOUNTER — Inpatient Hospital Stay: Payer: Medicare Other

## 2017-12-20 VITALS — BP 145/76 | HR 60 | Resp 20

## 2017-12-20 DIAGNOSIS — C50211 Malignant neoplasm of upper-inner quadrant of right female breast: Secondary | ICD-10-CM

## 2017-12-20 DIAGNOSIS — D500 Iron deficiency anemia secondary to blood loss (chronic): Secondary | ICD-10-CM

## 2017-12-20 DIAGNOSIS — Z170 Estrogen receptor positive status [ER+]: Principal | ICD-10-CM

## 2017-12-20 MED ORDER — SODIUM CHLORIDE 0.9 % IV SOLN
Freq: Once | INTRAVENOUS | Status: AC
Start: 2017-12-20 — End: 2017-12-20
  Administered 2017-12-20: 14:00:00 via INTRAVENOUS
  Filled 2017-12-20: qty 1000

## 2017-12-20 MED ORDER — IRON SUCROSE 20 MG/ML IV SOLN
200.0000 mg | Freq: Once | INTRAVENOUS | Status: AC
  Administered 2017-12-20: 200 mg via INTRAVENOUS
  Filled 2017-12-20: qty 10

## 2018-01-29 ENCOUNTER — Inpatient Hospital Stay: Payer: Medicare Other

## 2018-01-29 ENCOUNTER — Inpatient Hospital Stay: Payer: Medicare Other | Attending: Internal Medicine

## 2018-01-29 ENCOUNTER — Other Ambulatory Visit: Payer: Self-pay

## 2018-01-29 ENCOUNTER — Inpatient Hospital Stay (HOSPITAL_BASED_OUTPATIENT_CLINIC_OR_DEPARTMENT_OTHER): Payer: Medicare Other | Admitting: Internal Medicine

## 2018-01-29 VITALS — BP 150/75 | HR 54 | Temp 98.8°F | Resp 16 | Wt 162.2 lb

## 2018-01-29 DIAGNOSIS — D500 Iron deficiency anemia secondary to blood loss (chronic): Secondary | ICD-10-CM

## 2018-01-29 DIAGNOSIS — C50211 Malignant neoplasm of upper-inner quadrant of right female breast: Secondary | ICD-10-CM | POA: Diagnosis present

## 2018-01-29 DIAGNOSIS — Z803 Family history of malignant neoplasm of breast: Secondary | ICD-10-CM | POA: Insufficient documentation

## 2018-01-29 DIAGNOSIS — Z170 Estrogen receptor positive status [ER+]: Secondary | ICD-10-CM | POA: Insufficient documentation

## 2018-01-29 DIAGNOSIS — Z9221 Personal history of antineoplastic chemotherapy: Secondary | ICD-10-CM

## 2018-01-29 DIAGNOSIS — Z79811 Long term (current) use of aromatase inhibitors: Secondary | ICD-10-CM | POA: Diagnosis not present

## 2018-01-29 DIAGNOSIS — K922 Gastrointestinal hemorrhage, unspecified: Secondary | ICD-10-CM | POA: Insufficient documentation

## 2018-01-29 DIAGNOSIS — Z800 Family history of malignant neoplasm of digestive organs: Secondary | ICD-10-CM | POA: Insufficient documentation

## 2018-01-29 DIAGNOSIS — Z923 Personal history of irradiation: Secondary | ICD-10-CM | POA: Diagnosis not present

## 2018-01-29 LAB — CBC WITH DIFFERENTIAL/PLATELET
Basophils Absolute: 0.1 10*3/uL (ref 0–0.1)
Basophils Relative: 1 %
Eosinophils Absolute: 0.2 10*3/uL (ref 0–0.7)
Eosinophils Relative: 2 %
HCT: 39.6 % (ref 35.0–47.0)
Hemoglobin: 13.4 g/dL (ref 12.0–16.0)
LYMPHS ABS: 1.6 10*3/uL (ref 1.0–3.6)
Lymphocytes Relative: 19 %
MCH: 31.9 pg (ref 26.0–34.0)
MCHC: 33.8 g/dL (ref 32.0–36.0)
MCV: 94.5 fL (ref 80.0–100.0)
Monocytes Absolute: 0.7 10*3/uL (ref 0.2–0.9)
Monocytes Relative: 9 %
NEUTROS PCT: 69 %
Neutro Abs: 5.8 10*3/uL (ref 1.4–6.5)
Platelets: 248 10*3/uL (ref 150–440)
RBC: 4.19 MIL/uL (ref 3.80–5.20)
RDW: 15.3 % — ABNORMAL HIGH (ref 11.5–14.5)
WBC: 8.4 10*3/uL (ref 3.6–11.0)

## 2018-01-29 NOTE — Assessment & Plan Note (Addendum)
#   Breast cancer- Stage I- ER/PR positive; Her 2 neu- Neg; on femara; ca+vit D.  Stable.  #Anemia iron deficiency secondary to upper GIB- EGD/[place]-Dr.Toledo.  Status post IV Venofer hemoglobin currently improved; Hb-13. IMPROVED.   # follow up in 4 months/labs-cbc/iron studies/ferritin/bmp/possible Venofer

## 2018-01-29 NOTE — Progress Notes (Signed)
[facility] OFFICE PROGRESS NOTE  Patient Care Team: [name], MD as PCP - General (Family Medicine) Christene Lye, MD (General Surgery) Forest Gleason, MD (Inactive) (Oncology)  Cancer Staging Carcinoma of right breast [facility]) Staging form: Breast, AJCC 7th Edition - Clinical stage from 09/19/2014: Stage IA (yT1c, N0, M0) - Unsigned    Oncology History   # March, 2016- LEFT BREAST INVASIVE CA;  Lumpec & sentinel lymph node biopsy [Dr.sankar].  Low-grade tumor estrogen and progesterone receptor positive HER-2 receptor negative T1 cN0 M0.STAGE I S/p RT. Femara  # 2.  Left breast carcinoma breast 22 years ago patient had lumpectomy radiation therapy and 2 years of tamoxifen.  # IDA chronic-bleedin ulcer s/p EGD [facility] 2019- Dr.Toledo] -----------------------------------------   DIAGNOSIS: BREAST CANCER  STAGE:  I       ;GOALS: cure  CURRENT/MOST RECENT THERAPY: Femara      Carcinoma of right breast ([place])   10/12/2014 Initial Diagnosis    Carcinoma of left breast     Malignant neoplasm of upper-inner quadrant of right breast in female, estrogen receptor positive ([place])      INTERVAL HISTORY:  Kristy Hardy 84 y.o.  female pleasant patient above history of early stage breast cancer/iron deficient anemia secondary to GI bleed is here for follow-up.  Patient received IV iron at last visit approximately 4 months ago; her energy levels are improved. Otherwise denies any blood in stools.  Review of Systems  Constitutional: Negative for chills, diaphoresis, fever and weight loss.  HENT: Negative for nosebleeds and sore throat.   Eyes: Negative for double vision.  Respiratory: Negative for cough, hemoptysis, sputum production, shortness of breath and wheezing.   Cardiovascular: Negative for chest pain, palpitations, orthopnea and leg swelling.  Gastrointestinal: Negative for abdominal pain, blood in stool, constipation, diarrhea, heartburn,  melena, nausea and vomiting.  Genitourinary: Negative for dysuria, frequency and urgency.  Musculoskeletal: Positive for back pain and joint pain.  Skin: Negative.  Negative for itching and rash.  Neurological: Negative for tingling, focal weakness, weakness and headaches.  Endo/Heme/Allergies: Does not bruise/bleed easily.  Psychiatric/Behavioral: Negative for depression. The patient is not nervous/anxious and does not have insomnia.       PAST MEDICAL HISTORY :  Past Medical History:  Diagnosis Date  . Arthritis   . Atherosclerosis 2019   Dr Ubaldo Glassing  . Breast cancer ([place])   . Cancer ([place]) 11-11-91   left breast   . Cancer ([place]) 08-12-14   right breast, ER/PR positive, Her2 negative  . COPD (chronic obstructive pulmonary disease) ([place])   . Heart valve problem    leaking  . History of colonoscopy 2012  . History of mammogram 2016  . Personal history of radiation therapy 2016   Rt breast  . Personal history of radiation therapy 1993   Left breast  . Pneumonia July 2014    PAST SURGICAL HISTORY :   Past Surgical History:  Procedure Laterality Date  . BREAST BIOPSY Bilateral [id number]   Positive  . BREAST SURGERY Left 11-11-91   lumpectomy with radiation, Tamoxifen for 2 years.Dr. Sharlet Salina  . BREAST SURGERY Right 09/09/14   RIGHT BREAST WIDE EXCISION   . CHOLECYSTECTOMY  02/23/1972  . COLONOSCOPY    . COLONOSCOPY WITH PROPOFOL N/A 08/27/2017   Procedure: COLONOSCOPY WITH PROPOFOL;  Surgeon: Toledo, Benay Pike, MD;  Location: [facility] ENDOSCOPY;  Service: Gastroenterology;  Laterality: N/A;  . ESOPHAGOGASTRODUODENOSCOPY (EGD) WITH PROPOFOL N/A 08/26/2017   Procedure: ESOPHAGOGASTRODUODENOSCOPY (EGD) WITH  PROPOFOL;  Surgeon: Toledo, Benay Pike, MD;  Location: [facility] ENDOSCOPY;  Service: Gastroenterology;  Laterality: N/A;  . KYPHOPLASTY N/A 08/17/2017   Procedure: [id number];  Surgeon: Hessie Knows, MD;  Location: [facility] ORS;  Service: Orthopedics;  Laterality: N/A;    FAMILY HISTORY :    Family History  Problem Relation Age of Onset  . Colon cancer Mother 76  . Colon cancer Brother 58  . Breast cancer Sister 48    SOCIAL HISTORY:   Social History   Tobacco Use  . Smoking status: Never Smoker  . Smokeless tobacco: Never Used  Substance Use Topics  . Alcohol use: No    Alcohol/week: 0.0 standard drinks  . Drug use: No    ALLERGIES:  is allergic to amoxicillin-pot clavulanate; other; and sulfa antibiotics.  MEDICATIONS:  Current Outpatient Medications  Medication Sig Dispense Refill  . acetaminophen (TYLENOL) 650 MG CR tablet Take 1 tablet by mouth daily.    Marland Kitchen amiodarone (PACERONE) 200 MG tablet Take 1 tablet (200 mg total) by mouth 2 (two) times daily. 60 tablet 0  . amLODipine (NORVASC) 5 MG tablet Take 1 tablet by mouth daily.    . Iron, Ferrous Gluconate, 256 (28 Fe) MG TABS Take by mouth daily.    Marland Kitchen letrozole (FEMARA) 2.5 MG tablet Take 1 tablet (2.5 mg total) by mouth daily. 270 tablet 0  . levETIRAcetam (KEPPRA) 1000 MG tablet Take 1 tablet by mouth daily.    . Magnesium 250 MG TABS Take 1 tablet by mouth daily.     . meclizine (ANTIVERT) 25 MG tablet Take 25 mg by mouth 2 (two) times daily as needed for dizziness.    . metoprolol tartrate (LOPRESSOR) 25 MG tablet Take 25 mg by mouth daily.    Marland Kitchen omeprazole (PRILOSEC) 40 MG capsule Take 40 mg by mouth daily.    . pantoprazole (PROTONIX) 40 MG tablet Take 1 tablet (40 mg total) by mouth daily. 30 tablet 2  . PROAIR HFA 108 (90 Base) MCG/ACT inhaler Inhale 1 puff into the lungs every 6 (six) hours as needed for wheezing or shortness of breath.     . simvastatin (ZOCOR) 20 MG tablet Take 1 tablet by mouth daily.    . traMADol (ULTRAM) 50 MG tablet Take 1 tablet (50 mg total) by mouth 2 (two) times daily. (Patient taking differently: Take 50 mg by mouth daily. ) 20 tablet 0  . vitamin B-12 (CYANOCOBALAMIN) 250 MCG tablet Take 250 mcg by mouth daily.     No current facility-administered medications for this  visit.     PHYSICAL EXAMINATION: ECOG PERFORMANCE STATUS: 0 - Asymptomatic  BP (!) 150/75 (BP Location: Left Arm, Patient Position: Sitting)   Pulse (!) 54   Temp 98.8 F (37.1 C) (Tympanic)   Resp 16   Wt 162 lb 3.2 oz (73.6 kg)   BMI 26.99 kg/m   Filed Weights   01/29/18 1418  Weight: 162 lb 3.2 oz (73.6 kg)   Physical Exam  Constitutional: She is oriented to person, place, and time and well-developed, well-nourished, and in no distress.  Accompanied by daughter patient. patient is walking herself.  HENT:  Head: Normocephalic and atraumatic.  Mouth/Throat: Oropharynx is clear and moist. No oropharyngeal exudate.  Eyes: Pupils are equal, round, and reactive to light.  Neck: Normal range of motion. Neck supple.  Cardiovascular: Normal rate and regular rhythm.  Pulmonary/Chest: No respiratory distress. She has no wheezes.  Abdominal: Soft. Bowel sounds are normal. She exhibits  no distension and no mass. There is no tenderness. There is no rebound and no guarding.  Musculoskeletal: Normal range of motion. She exhibits no edema or tenderness.  Neurological: She is alert and oriented to person, place, and time.  Skin: Skin is warm.  Psychiatric: Affect normal.       LABORATORY DATA:  I have reviewed the data as listed    Component Value Date/Time   NA 135 11/20/2017 1144   NA 135 09/01/2014 1506   K 4.0 11/20/2017 1144   K 4.1 09/01/2014 1506   CL 101 11/20/2017 1144   CL 98 (L) 09/01/2014 1506   CO2 25 11/20/2017 1144   CO2 30 09/01/2014 1506   GLUCOSE 96 11/20/2017 1144   GLUCOSE 114 (H) 09/01/2014 1506   BUN 21 11/20/2017 1144   BUN 19 09/01/2014 1506   CREATININE 0.95 11/20/2017 1144   CREATININE 0.91 09/01/2014 1506   CALCIUM 9.2 11/20/2017 1144   CALCIUM 9.5 09/01/2014 1506   PROT 7.6 11/20/2017 1144   PROT 7.8 09/01/2014 1506   ALBUMIN 4.1 11/20/2017 1144   ALBUMIN 4.2 09/01/2014 1506   AST 26 11/20/2017 1144   AST 29 09/01/2014 1506   ALT 18  11/20/2017 1144   ALT 19 09/01/2014 1506   ALKPHOS 70 11/20/2017 1144   ALKPHOS 65 09/01/2014 1506   BILITOT 0.4 11/20/2017 1144   BILITOT 0.5 09/01/2014 1506   GFRNONAA 51 (L) 11/20/2017 1144   GFRNONAA 57 (L) 09/01/2014 1506   GFRAA 59 (L) 11/20/2017 1144   GFRAA >60 09/01/2014 1506    No results found for: SPEP, UPEP  Lab Results  Component Value Date   WBC 8.4 01/29/2018   NEUTROABS 5.8 01/29/2018   HGB 13.4 01/29/2018   HCT 39.6 01/29/2018   MCV 94.5 01/29/2018   PLT 248 01/29/2018      Chemistry      Component Value Date/Time   NA 135 11/20/2017 1144   NA 135 09/01/2014 1506   K 4.0 11/20/2017 1144   K 4.1 09/01/2014 1506   CL 101 11/20/2017 1144   CL 98 (L) 09/01/2014 1506   CO2 25 11/20/2017 1144   CO2 30 09/01/2014 1506   BUN 21 11/20/2017 1144   BUN 19 09/01/2014 1506   CREATININE 0.95 11/20/2017 1144   CREATININE 0.91 09/01/2014 1506      Component Value Date/Time   CALCIUM 9.2 11/20/2017 1144   CALCIUM 9.5 09/01/2014 1506   ALKPHOS 70 11/20/2017 1144   ALKPHOS 65 09/01/2014 1506   AST 26 11/20/2017 1144   AST 29 09/01/2014 1506   ALT 18 11/20/2017 1144   ALT 19 09/01/2014 1506   BILITOT 0.4 11/20/2017 1144   BILITOT 0.5 09/01/2014 1506       RADIOGRAPHIC STUDIES: I have personally reviewed the radiological images as listed and agreed with the findings in the report. No results found.   ASSESSMENT & PLAN:  Malignant neoplasm of upper-inner quadrant of right breast in female, estrogen receptor positive ([place]) # Breast cancer- Stage I- ER/PR positive; Her 2 neu- Neg; on femara; ca+vit D.  Stable.  #Anemia iron deficiency secondary to upper GIB- EGD/[facility]-Dr.Toledo.  Status post IV Venofer hemoglobin currently improved; Hb-13. IMPROVED.   # follow up in 4 months/labs-cbc/iron studies/ferritin/bmp/possible Venofer   No orders of the defined types were placed in this encounter.  All questions were answered. The patient knows to call the clinic  with any problems, questions or concerns.  Cammie Sickle, MD 01/29/2018 3:21 PM

## 2018-05-24 ENCOUNTER — Other Ambulatory Visit: Payer: Self-pay

## 2018-05-24 DIAGNOSIS — C183 Malignant neoplasm of hepatic flexure: Secondary | ICD-10-CM

## 2018-05-28 ENCOUNTER — Inpatient Hospital Stay (HOSPITAL_BASED_OUTPATIENT_CLINIC_OR_DEPARTMENT_OTHER): Payer: Medicare Other | Admitting: Internal Medicine

## 2018-05-28 ENCOUNTER — Encounter: Payer: Self-pay | Admitting: Internal Medicine

## 2018-05-28 ENCOUNTER — Telehealth: Payer: Self-pay | Admitting: Internal Medicine

## 2018-05-28 ENCOUNTER — Inpatient Hospital Stay: Payer: Medicare Other

## 2018-05-28 ENCOUNTER — Inpatient Hospital Stay: Payer: Medicare Other | Attending: Internal Medicine

## 2018-05-28 VITALS — BP 153/80 | HR 56 | Temp 97.6°F | Resp 16 | Wt 158.2 lb

## 2018-05-28 DIAGNOSIS — Z800 Family history of malignant neoplasm of digestive organs: Secondary | ICD-10-CM | POA: Insufficient documentation

## 2018-05-28 DIAGNOSIS — Z803 Family history of malignant neoplasm of breast: Secondary | ICD-10-CM | POA: Diagnosis not present

## 2018-05-28 DIAGNOSIS — C50211 Malignant neoplasm of upper-inner quadrant of right female breast: Secondary | ICD-10-CM | POA: Insufficient documentation

## 2018-05-28 DIAGNOSIS — C183 Malignant neoplasm of hepatic flexure: Secondary | ICD-10-CM

## 2018-05-28 DIAGNOSIS — Z79811 Long term (current) use of aromatase inhibitors: Secondary | ICD-10-CM | POA: Diagnosis not present

## 2018-05-28 DIAGNOSIS — K922 Gastrointestinal hemorrhage, unspecified: Secondary | ICD-10-CM | POA: Diagnosis not present

## 2018-05-28 DIAGNOSIS — D500 Iron deficiency anemia secondary to blood loss (chronic): Secondary | ICD-10-CM

## 2018-05-28 DIAGNOSIS — Z170 Estrogen receptor positive status [ER+]: Secondary | ICD-10-CM

## 2018-05-28 DIAGNOSIS — Z923 Personal history of irradiation: Secondary | ICD-10-CM | POA: Diagnosis not present

## 2018-05-28 LAB — CBC WITH DIFFERENTIAL/PLATELET
ABS IMMATURE GRANULOCYTES: 0.02 10*3/uL (ref 0.00–0.07)
Basophils Absolute: 0.1 10*3/uL (ref 0.0–0.1)
Basophils Relative: 2 %
EOS PCT: 2 %
Eosinophils Absolute: 0.1 10*3/uL (ref 0.0–0.5)
HEMATOCRIT: 38.8 % (ref 36.0–46.0)
HEMOGLOBIN: 13.2 g/dL (ref 12.0–15.0)
Immature Granulocytes: 0 %
LYMPHS ABS: 1.6 10*3/uL (ref 0.7–4.0)
LYMPHS PCT: 23 %
MCH: 32.5 pg (ref 26.0–34.0)
MCHC: 34 g/dL (ref 30.0–36.0)
MCV: 95.6 fL (ref 80.0–100.0)
MONO ABS: 0.7 10*3/uL (ref 0.1–1.0)
MONOS PCT: 11 %
NEUTROS ABS: 4.4 10*3/uL (ref 1.7–7.7)
Neutrophils Relative %: 62 %
Platelets: 257 10*3/uL (ref 150–400)
RBC: 4.06 MIL/uL (ref 3.87–5.11)
RDW: 12.4 % (ref 11.5–15.5)
WBC: 7 10*3/uL (ref 4.0–10.5)
nRBC: 0 % (ref 0.0–0.2)

## 2018-05-28 LAB — IRON AND TIBC
Iron: 57 ug/dL (ref 28–170)
Saturation Ratios: 16 % (ref 10.4–31.8)
TIBC: 349 ug/dL (ref 250–450)
UIBC: 292 ug/dL

## 2018-05-28 LAB — COMPREHENSIVE METABOLIC PANEL
ALT: 24 U/L (ref 0–44)
AST: 26 U/L (ref 15–41)
Albumin: 4 g/dL (ref 3.5–5.0)
Alkaline Phosphatase: 90 U/L (ref 38–126)
Anion gap: 11 (ref 5–15)
BUN: 19 mg/dL (ref 8–23)
CHLORIDE: 101 mmol/L (ref 98–111)
CO2: 25 mmol/L (ref 22–32)
CREATININE: 1 mg/dL (ref 0.44–1.00)
Calcium: 8.9 mg/dL (ref 8.9–10.3)
GFR calc Af Amer: 57 mL/min — ABNORMAL LOW (ref >60)
GFR calc non Af Amer: 49 mL/min — ABNORMAL LOW (ref >60)
GLUCOSE: 110 mg/dL — AB (ref 70–99)
POTASSIUM: 3.6 mmol/L (ref 3.5–5.1)
SODIUM: 137 mmol/L (ref 135–145)
Total Bilirubin: 0.5 mg/dL (ref 0.3–1.2)
Total Protein: 7.6 g/dL (ref 6.5–8.1)

## 2018-05-28 LAB — FERRITIN: Ferritin: 47 ng/mL (ref 11–307)

## 2018-05-28 NOTE — Assessment & Plan Note (Addendum)
#   Breast cancer- Stage I- ER/PR positive; Her 2 neu- Neg; on femara; ca+vit D.  Stable  #Anemia iron deficiency- secondary to upper GIB-clinically stable.  Hemoglobin 13.  Continue p.o. iron.  Iron studies pending today.  If significantly low would recommend IV iron today.    # DISPOSITION: will call daughter e: IV iron/ margaret- [phone]/[id number] emergency contact # NO venofer today.  # follow up in 6 months/labs-cbc/iron studies/ferritin/bmp/possible Venofer-Dr.B  Addendum: Reviewed the iron studies-saturation 16%; ferritin 47% recommend holding off any IV iron at this time.  Continue p.o. iron.  No new recommendations follow-up as planned.  Daughter will be informed

## 2018-05-28 NOTE — Telephone Encounter (Signed)
x

## 2018-05-28 NOTE — Progress Notes (Signed)
[place] OFFICE PROGRESS NOTE  Patient Care Team: [name], MD as PCP - General (Family Medicine) Christene Lye, MD (General Surgery) Forest Gleason, MD (Inactive) (Oncology)  Cancer Staging Carcinoma of right breast [facility]) Staging form: Breast, AJCC 7th Edition - Clinical stage from 09/19/2014: Stage IA (yT1c, N0, M0) - Unsigned    Oncology History   # March, 2016- LEFT BREAST INVASIVE CA;  Lumpec & sentinel lymph node biopsy [Dr.sankar].  Low-grade tumor estrogen and progesterone receptor positive HER-2 receptor negative T1 cN0 M0.STAGE I S/p RT. Femara  # 2.  Left breast carcinoma breast 22 years ago patient had lumpectomy radiation therapy and 2 years of tamoxifen.  # IDA chronic-bleedin ulcer s/p EGD [facility] 2019- Dr.Toledo] -----------------------------------------   DIAGNOSIS: BREAST CANCER  STAGE:  I       ;GOALS: cure  CURRENT/MOST RECENT THERAPY: Femara      Carcinoma of right breast ([place])   10/12/2014 Initial Diagnosis    Carcinoma of left breast     Malignant neoplasm of upper-inner quadrant of right breast in female, estrogen receptor positive ([place])      INTERVAL HISTORY:  Kristy Hardy 85 y.o.  female pleasant patient above history of early stage breast cancer/iron deficient anemia secondary to GI bleed is here for follow-up.  Patient continues to be on oral iron once a day.  Denies any blood in stools.  Intermittent black-colored stools.  She has good energy levels.  No unusual cough.  Mild shortness of breath on exertion.  No swelling in the legs.  Review of Systems  Constitutional: Negative for chills, diaphoresis, fever and weight loss.  HENT: Negative for nosebleeds and sore throat.   Eyes: Negative for double vision.  Respiratory: Negative for cough, hemoptysis, sputum production, shortness of breath and wheezing.   Cardiovascular: Negative for chest pain, palpitations, orthopnea and leg swelling.   Gastrointestinal: Negative for abdominal pain, blood in stool, constipation, diarrhea, heartburn, melena, nausea and vomiting.  Genitourinary: Negative for dysuria, frequency and urgency.  Musculoskeletal: Positive for back pain and joint pain.  Skin: Negative.  Negative for itching and rash.  Neurological: Negative for tingling, focal weakness, weakness and headaches.  Endo/Heme/Allergies: Does not bruise/bleed easily.  Psychiatric/Behavioral: Negative for depression. The patient is not nervous/anxious and does not have insomnia.       PAST MEDICAL HISTORY :  Past Medical History:  Diagnosis Date  . Arthritis   . Atherosclerosis 2019   Dr Ubaldo Glassing  . Breast cancer ([place])   . Cancer ([place]) 11-11-91   left breast   . Cancer ([place]) 08-12-14   right breast, ER/PR positive, Her2 negative  . COPD (chronic obstructive pulmonary disease) ([place])   . Heart valve problem    leaking  . History of colonoscopy 2012  . History of mammogram 2016  . Personal history of radiation therapy 2016   Rt breast  . Personal history of radiation therapy 1993   Left breast  . Pneumonia July 2014    PAST SURGICAL HISTORY :   Past Surgical History:  Procedure Laterality Date  . BREAST BIOPSY Bilateral [id number]   Positive  . BREAST SURGERY Left 11-11-91   lumpectomy with radiation, Tamoxifen for 2 years.Dr. Sharlet Salina  . BREAST SURGERY Right 09/09/14   RIGHT BREAST WIDE EXCISION   . CHOLECYSTECTOMY  02/23/1972  . COLONOSCOPY    . COLONOSCOPY WITH PROPOFOL N/A 08/27/2017   Procedure: COLONOSCOPY WITH PROPOFOL;  Surgeon: Alice Reichert, Benay Pike, MD;  Location: [facility]  ENDOSCOPY;  Service: Gastroenterology;  Laterality: N/A;  . ESOPHAGOGASTRODUODENOSCOPY (EGD) WITH PROPOFOL N/A 08/26/2017   Procedure: ESOPHAGOGASTRODUODENOSCOPY (EGD) WITH PROPOFOL;  Surgeon: Toledo, Benay Pike, MD;  Location: [facility] ENDOSCOPY;  Service: Gastroenterology;  Laterality: N/A;  . KYPHOPLASTY N/A 08/17/2017   Procedure: [id number];  Surgeon: Hessie Knows, MD;  Location: [facility] ORS;  Service: Orthopedics;  Laterality: N/A;    FAMILY HISTORY :   Family History  Problem Relation Age of Onset  . Colon cancer Mother 59  . Colon cancer Brother 64  . Breast cancer Sister 59    SOCIAL HISTORY:   Social History   Tobacco Use  . Smoking status: Never Smoker  . Smokeless tobacco: Never Used  Substance Use Topics  . Alcohol use: No    Alcohol/week: 0.0 standard drinks  . Drug use: No    ALLERGIES:  is allergic to amoxicillin-pot clavulanate; other; and sulfa antibiotics.  MEDICATIONS:  Current Outpatient Medications  Medication Sig Dispense Refill  . acetaminophen (TYLENOL) 650 MG CR tablet Take 1 tablet by mouth daily.    Marland Kitchen amiodarone (PACERONE) 200 MG tablet Take 1 tablet (200 mg total) by mouth 2 (two) times daily. 60 tablet 0  . amLODipine (NORVASC) 5 MG tablet Take 1 tablet by mouth daily.    . Iron, Ferrous Gluconate, 256 (28 Fe) MG TABS Take by mouth daily.    Marland Kitchen letrozole (FEMARA) 2.5 MG tablet Take 1 tablet (2.5 mg total) by mouth daily. 270 tablet 0  . levETIRAcetam (KEPPRA) 1000 MG tablet Take 1 tablet by mouth daily.    . Magnesium 250 MG TABS Take 1 tablet by mouth daily.     . meclizine (ANTIVERT) 25 MG tablet Take 25 mg by mouth 2 (two) times daily as needed for dizziness.    . metoprolol tartrate (LOPRESSOR) 25 MG tablet Take 25 mg by mouth daily.    Marland Kitchen omeprazole (PRILOSEC) 40 MG capsule Take 40 mg by mouth daily.    . pantoprazole (PROTONIX) 40 MG tablet Take 1 tablet (40 mg total) by mouth daily. 30 tablet 2  . PROAIR HFA 108 (90 Base) MCG/ACT inhaler Inhale 1 puff into the lungs every 6 (six) hours as needed for wheezing or shortness of breath.     . simvastatin (ZOCOR) 20 MG tablet Take 1 tablet by mouth daily.    . traMADol (ULTRAM) 50 MG tablet Take 1 tablet (50 mg total) by mouth 2 (two) times daily. (Patient taking differently: Take 50 mg by mouth daily. ) 20 tablet 0  . vitamin B-12 (CYANOCOBALAMIN) 250 MCG  tablet Take 250 mcg by mouth daily.     No current facility-administered medications for this visit.     PHYSICAL EXAMINATION: ECOG PERFORMANCE STATUS: 0 - Asymptomatic  BP (!) 153/80 (BP Location: Right Arm, Patient Position: Sitting, Cuff Size: Normal)   Pulse (!) 56   Temp 97.6 F (36.4 C) (Tympanic)   Resp 16   Wt 158 lb 3.2 oz (71.8 kg)   BMI 26.33 kg/m   Filed Weights   05/28/18 1325  Weight: 158 lb 3.2 oz (71.8 kg)   Physical Exam  Constitutional: She is oriented to person, place, and time and well-developed, well-nourished, and in no distress.  Accompanied by daughter patient. patient is walking herself.  HENT:  Head: Normocephalic and atraumatic.  Mouth/Throat: Oropharynx is clear and moist. No oropharyngeal exudate.  Eyes: Pupils are equal, round, and reactive to light.  Neck: Normal range of motion. Neck supple.  Cardiovascular: Normal rate and regular rhythm.  Pulmonary/Chest: No respiratory distress. She has no wheezes.  Abdominal: Soft. Bowel sounds are normal. She exhibits no distension and no mass. There is no abdominal tenderness. There is no rebound and no guarding.  Musculoskeletal: Normal range of motion.        General: No tenderness or edema.  Neurological: She is alert and oriented to person, place, and time.  Skin: Skin is warm.  Psychiatric: Affect normal.       LABORATORY DATA:  I have reviewed the data as listed    Component Value Date/Time   NA 137 05/28/2018 1308   NA 135 09/01/2014 1506   K 3.6 05/28/2018 1308   K 4.1 09/01/2014 1506   CL 101 05/28/2018 1308   CL 98 (L) 09/01/2014 1506   CO2 25 05/28/2018 1308   CO2 30 09/01/2014 1506   GLUCOSE 110 (H) 05/28/2018 1308   GLUCOSE 114 (H) 09/01/2014 1506   BUN 19 05/28/2018 1308   BUN 19 09/01/2014 1506   CREATININE 1.00 05/28/2018 1308   CREATININE 0.91 09/01/2014 1506   CALCIUM 8.9 05/28/2018 1308   CALCIUM 9.5 09/01/2014 1506   PROT 7.6 05/28/2018 1308   PROT 7.8 09/01/2014  1506   ALBUMIN 4.0 05/28/2018 1308   ALBUMIN 4.2 09/01/2014 1506   AST 26 05/28/2018 1308   AST 29 09/01/2014 1506   ALT 24 05/28/2018 1308   ALT 19 09/01/2014 1506   ALKPHOS 90 05/28/2018 1308   ALKPHOS 65 09/01/2014 1506   BILITOT 0.5 05/28/2018 1308   BILITOT 0.5 09/01/2014 1506   GFRNONAA 49 (L) 05/28/2018 1308   GFRNONAA 57 (L) 09/01/2014 1506   GFRAA 57 (L) 05/28/2018 1308   GFRAA >60 09/01/2014 1506    No results found for: SPEP, UPEP  Lab Results  Component Value Date   WBC 7.0 05/28/2018   NEUTROABS 4.4 05/28/2018   HGB 13.2 05/28/2018   HCT 38.8 05/28/2018   MCV 95.6 05/28/2018   PLT 257 05/28/2018      Chemistry      Component Value Date/Time   NA 137 05/28/2018 1308   NA 135 09/01/2014 1506   K 3.6 05/28/2018 1308   K 4.1 09/01/2014 1506   CL 101 05/28/2018 1308   CL 98 (L) 09/01/2014 1506   CO2 25 05/28/2018 1308   CO2 30 09/01/2014 1506   BUN 19 05/28/2018 1308   BUN 19 09/01/2014 1506   CREATININE 1.00 05/28/2018 1308   CREATININE 0.91 09/01/2014 1506      Component Value Date/Time   CALCIUM 8.9 05/28/2018 1308   CALCIUM 9.5 09/01/2014 1506   ALKPHOS 90 05/28/2018 1308   ALKPHOS 65 09/01/2014 1506   AST 26 05/28/2018 1308   AST 29 09/01/2014 1506   ALT 24 05/28/2018 1308   ALT 19 09/01/2014 1506   BILITOT 0.5 05/28/2018 1308   BILITOT 0.5 09/01/2014 1506       RADIOGRAPHIC STUDIES: I have personally reviewed the radiological images as listed and agreed with the findings in the report. No results found.   ASSESSMENT & PLAN:  Malignant neoplasm of upper-inner quadrant of right breast in female, estrogen receptor positive ([place]) # Breast cancer- Stage I- ER/PR positive; Her 2 neu- Neg; on femara; ca+vit D.  Stable  #Anemia iron deficiency- secondary to upper GIB-clinically stable.  Hemoglobin 13.  Continue p.o. iron.  Iron studies pending today.  If significantly low would recommend IV iron today.    #  DISPOSITION: will call daughter e:  IV iron/ margaret- [phone]/[id number] emergency contact # NO venofer today.  # follow up in 6 months/labs-cbc/iron studies/ferritin/bmp/possible Venofer-Dr.B   No orders of the defined types were placed in this encounter.  All questions were answered. The patient knows to call the clinic with any problems, questions or concerns.      Cammie Sickle, MD 05/28/2018 2:06 PM

## 2018-05-29 ENCOUNTER — Telehealth: Payer: Self-pay | Admitting: Internal Medicine

## 2018-05-29 NOTE — Telephone Encounter (Signed)
[name]/[name] - Please inform patient's daughter Kristy Hardy that she does not need any IV iron at this time.  Continue p.o. iron.  No new recommendations follow-up as planned.     I reviewed the iron studies-saturation 16%; ferritin 47% recommend holding off any IV iron at this time.

## 2018-05-29 NOTE — Telephone Encounter (Signed)
I left message for patient's daughter Joycelyn Schmid with this information, and I advised her to call back with any questions.

## 2018-06-15 ENCOUNTER — Encounter: Payer: Self-pay | Admitting: *Deleted

## 2018-08-06 ENCOUNTER — Other Ambulatory Visit: Payer: Self-pay | Admitting: Internal Medicine

## 2018-08-13 ENCOUNTER — Ambulatory Visit: Payer: Medicare Other | Admitting: Radiation Oncology

## 2018-08-22 ENCOUNTER — Ambulatory Visit: Payer: Medicare Other | Admitting: Radiation Oncology

## 2018-10-03 ENCOUNTER — Ambulatory Visit: Payer: Medicare Other | Admitting: Radiation Oncology

## 2018-11-19 ENCOUNTER — Other Ambulatory Visit: Payer: Self-pay | Admitting: *Deleted

## 2018-11-19 DIAGNOSIS — C50211 Malignant neoplasm of upper-inner quadrant of right female breast: Secondary | ICD-10-CM

## 2018-11-22 ENCOUNTER — Other Ambulatory Visit: Payer: Self-pay

## 2018-11-26 ENCOUNTER — Ambulatory Visit
Admission: RE | Admit: 2018-11-26 | Discharge: 2018-11-26 | Disposition: A | Discharge status: Home / Self Care | Payer: Medicare Other | Source: Ambulatory Visit | Attending: Radiation Oncology | Admitting: Radiation Oncology

## 2018-11-26 ENCOUNTER — Inpatient Hospital Stay (HOSPITAL_BASED_OUTPATIENT_CLINIC_OR_DEPARTMENT_OTHER): Payer: Medicare Other | Admitting: Internal Medicine

## 2018-11-26 ENCOUNTER — Inpatient Hospital Stay: Payer: Medicare Other | Attending: Internal Medicine

## 2018-11-26 ENCOUNTER — Encounter: Payer: Self-pay | Admitting: Internal Medicine

## 2018-11-26 ENCOUNTER — Other Ambulatory Visit: Payer: Self-pay

## 2018-11-26 ENCOUNTER — Other Ambulatory Visit: Payer: Self-pay | Admitting: *Deleted

## 2018-11-26 ENCOUNTER — Inpatient Hospital Stay: Payer: Medicare Other

## 2018-11-26 ENCOUNTER — Encounter: Payer: Self-pay | Admitting: Radiation Oncology

## 2018-11-26 VITALS — BP 151/71 | HR 97 | Temp 97.0°F | Resp 18 | Wt 159.3 lb

## 2018-11-26 DIAGNOSIS — Z170 Estrogen receptor positive status [ER+]: Secondary | ICD-10-CM | POA: Insufficient documentation

## 2018-11-26 DIAGNOSIS — D500 Iron deficiency anemia secondary to blood loss (chronic): Secondary | ICD-10-CM

## 2018-11-26 DIAGNOSIS — Z800 Family history of malignant neoplasm of digestive organs: Secondary | ICD-10-CM | POA: Insufficient documentation

## 2018-11-26 DIAGNOSIS — Z923 Personal history of irradiation: Secondary | ICD-10-CM

## 2018-11-26 DIAGNOSIS — Z803 Family history of malignant neoplasm of breast: Secondary | ICD-10-CM | POA: Insufficient documentation

## 2018-11-26 DIAGNOSIS — Z79811 Long term (current) use of aromatase inhibitors: Secondary | ICD-10-CM | POA: Diagnosis not present

## 2018-11-26 DIAGNOSIS — C50211 Malignant neoplasm of upper-inner quadrant of right female breast: Secondary | ICD-10-CM | POA: Insufficient documentation

## 2018-11-26 DIAGNOSIS — K922 Gastrointestinal hemorrhage, unspecified: Secondary | ICD-10-CM | POA: Insufficient documentation

## 2018-11-26 LAB — CBC WITH DIFFERENTIAL/PLATELET
Abs Immature Granulocytes: 0.04 10*3/uL (ref 0.00–0.07)
Basophils Absolute: 0.1 10*3/uL (ref 0.0–0.1)
Basophils Relative: 2 %
Eosinophils Absolute: 0.1 10*3/uL (ref 0.0–0.5)
Eosinophils Relative: 2 %
HCT: 38.7 % (ref 36.0–46.0)
Hemoglobin: 13.2 g/dL (ref 12.0–15.0)
Immature Granulocytes: 1 %
Lymphocytes Relative: 24 %
Lymphs Abs: 1.9 10*3/uL (ref 0.7–4.0)
MCH: 32.8 pg (ref 26.0–34.0)
MCHC: 34.1 g/dL (ref 30.0–36.0)
MCV: 96 fL (ref 80.0–100.0)
Monocytes Absolute: 0.9 10*3/uL (ref 0.1–1.0)
Monocytes Relative: 11 %
Neutro Abs: 4.9 10*3/uL (ref 1.7–7.7)
Neutrophils Relative %: 60 %
Platelets: 231 10*3/uL (ref 150–400)
RBC: 4.03 MIL/uL (ref 3.87–5.11)
RDW: 12.9 % (ref 11.5–15.5)
WBC: 8 10*3/uL (ref 4.0–10.5)
nRBC: 0 % (ref 0.0–0.2)

## 2018-11-26 LAB — IRON AND TIBC
Iron: 137 ug/dL (ref 28–170)
Saturation Ratios: 36 % — ABNORMAL HIGH (ref 10.4–31.8)
TIBC: 381 ug/dL (ref 250–450)
UIBC: 244 ug/dL

## 2018-11-26 LAB — BASIC METABOLIC PANEL
Anion gap: 12 (ref 5–15)
BUN: 32 mg/dL — ABNORMAL HIGH (ref 8–23)
CO2: 22 mmol/L (ref 22–32)
Calcium: 8.9 mg/dL (ref 8.9–10.3)
Chloride: 104 mmol/L (ref 98–111)
Creatinine, Ser: 1.42 mg/dL — ABNORMAL HIGH (ref 0.44–1.00)
GFR calc Af Amer: 37 mL/min — ABNORMAL LOW (ref >60)
GFR calc non Af Amer: 32 mL/min — ABNORMAL LOW (ref >60)
Glucose, Bld: 108 mg/dL — ABNORMAL HIGH (ref 70–99)
Potassium: 4.1 mmol/L (ref 3.5–5.1)
Sodium: 138 mmol/L (ref 135–145)

## 2018-11-26 LAB — FERRITIN: Ferritin: 51 ng/mL (ref 11–307)

## 2018-11-26 NOTE — Assessment & Plan Note (Addendum)
#   Breast cancer- Stage I- ER/PR positive; Her 2 neu- Neg; on femara; ca+vit D.  Stable.  Mammogram August 2020 pending/ordered.  #Anemia iron deficiency- sec GIB-  Hemoglobin 13.  Continue p.o. iron. Stable.   # Increase in creatinine- 1.4- ? Etiology.  Recommend avoiding nephrotoxic agents.  Recommend repeating BMP in 2 weeks.  If not improved/worsening would recommend further work-up.  # DISPOSITION: Discussed with the patient's daughter Joycelyn Schmid over the phone [phone].   # NO venofer today.  # BMP in 2 weeks # follow up in 6 months MD/ labs-cbc/iron studies/ferritin/bmp/possible Venofer-Dr.B

## 2018-11-26 NOTE — Progress Notes (Signed)
Radiation Oncology Follow up Note  Name: Kristy Hardy   Date:   11/26/2018 MRN:  [id number] DOB: [birth year]    This 85 y.o. female presents to the clinic today for 3-1/2-year follow-up status post whole breast radiation to her right breast for stage I invasive mammary carcinoma ER PR positive.  REFERRING PROVIDER: [name], MD  HPI: Patient is a 85 year old female now about 3-1/2 years having completed whole breast radiation to her right breast for ER PR positive invasive mammary carcinoma stage I seen today in routine follow-up she is doing well.  She specifically denies breast tenderness cough or bone pain.Marland Kitchen  Her last mammogram was back in July it was BI-RADS 2 benign she is scheduled for 1 next week which I will review when it becomes available.  She is currently on Femara tolerating that well without side effect.  COMPLICATIONS OF TREATMENT: none  FOLLOW UP COMPLIANCE: keeps appointments   PHYSICAL EXAM:  BP (!) 151/71 (BP Location: Right Arm, Patient Position: Sitting)   Pulse 97   Temp (!) 97 F (36.1 C) (Tympanic)   Resp 18   Wt 159 lb 4.5 oz (72.3 kg)   BMI 26.51 kg/m  Lungs are clear to A&P cardiac examination essentially unremarkable with regular rate and rhythm. No dominant mass or nodularity is noted in either breast in 2 positions examined. Incision is well-healed. No axillary or supraclavicular adenopathy is appreciated. Cosmetic result is excellent.  Well-developed well-nourished patient in NAD. HEENT reveals PERLA, EOMI, discs not visualized.  Oral cavity is clear. No oral mucosal lesions are identified. Neck is clear without evidence of cervical or supraclavicular adenopathy. Lungs are clear to A&P. Cardiac examination is essentially unremarkable with regular rate and rhythm without murmur rub or thrill. Abdomen is benign with no organomegaly or masses noted. Motor sensory and DTR levels are equal and symmetric in the upper and lower extremities. Cranial  nerves II through XII are grossly intact. Proprioception is intact. No peripheral adenopathy or edema is identified. No motor or sensory levels are noted. Crude visual fields are within normal range.  RADIOLOGY RESULTS: Mammograms reviewed compatible with above-stated findings  PLAN: Present time patient is doing well with no evidence of disease 3-1/2 years out.  Based on her age favorable prognosis I am going to discontinue follow-up care.  She continues close follow-up care with medical oncology.  Patient knows to call with any concerns.  I would like to take this opportunity to thank you for allowing me to participate in the care of your patient.Noreene Filbert, MD

## 2018-11-26 NOTE — Progress Notes (Signed)
[place] OFFICE PROGRESS NOTE  Patient Care Team: [name], MD as PCP - General (Family Medicine) Christene Lye, MD (General Surgery) Forest Gleason, MD (Inactive) (Oncology)  Cancer Staging Carcinoma of right breast [facility]) Staging form: Breast, AJCC 7th Edition - Clinical stage from 09/19/2014: Stage IA (yT1c, N0, M0) - Unsigned    Oncology History Overview Note  # March, 2016- LEFT BREAST INVASIVE CA;  Lumpec & sentinel lymph node biopsy [Dr.sankar].  Low-grade tumor estrogen and progesterone receptor positive HER-2 receptor negative T1 cN0 M0.STAGE I S/p RT. Femara  # 2.  Left breast carcinoma breast 22 years ago patient had lumpectomy radiation therapy and 2 years of tamoxifen.  # IDA chronic-bleedin ulcer s/p EGD [facility] 2019- Dr.Toledo] -----------------------------------------   DIAGNOSIS: BREAST CANCER  STAGE:  I       ;GOALS: cure  CURRENT/MOST RECENT THERAPY: Femara    Carcinoma of right breast ([place])  10/12/2014 Initial Diagnosis   Carcinoma of left breast   Malignant neoplasm of upper-inner quadrant of right breast in female, estrogen receptor positive ([place])      INTERVAL HISTORY:  Kristy Hardy 85 y.o.  female pleasant patient above history of early stage breast cancer/iron deficient anemia secondary to GI bleed is here for follow-up.  Patient denies any fatigue.  Denies any nausea vomiting; denies any blood in stools.  She continues to take iron pills.  Chronic mild joint pains.  Denies any urinary problems.  Review of Systems  Constitutional: Negative for chills, diaphoresis, fever and weight loss.  HENT: Negative for nosebleeds and sore throat.   Eyes: Negative for double vision.  Respiratory: Negative for cough, hemoptysis, sputum production, shortness of breath and wheezing.   Cardiovascular: Negative for chest pain, palpitations, orthopnea and leg swelling.  Gastrointestinal: Negative for abdominal pain, blood in  stool, constipation, diarrhea, heartburn, melena, nausea and vomiting.  Genitourinary: Negative for dysuria, frequency and urgency.  Musculoskeletal: Positive for back pain and joint pain.  Skin: Negative.  Negative for itching and rash.  Neurological: Negative for tingling, focal weakness, weakness and headaches.  Endo/Heme/Allergies: Does not bruise/bleed easily.  Psychiatric/Behavioral: Negative for depression. The patient is not nervous/anxious and does not have insomnia.       PAST MEDICAL HISTORY :  Past Medical History:  Diagnosis Date  . Arthritis   . Atherosclerosis 2019   Dr Ubaldo Glassing  . Breast cancer ([place])   . Cancer ([place]) 11-11-91   left breast   . Cancer ([place]) 08-12-14   right breast, ER/PR positive, Her2 negative  . COPD (chronic obstructive pulmonary disease) ([place])   . Heart valve problem    leaking  . History of colonoscopy 2012  . History of mammogram 2016  . Personal history of radiation therapy 2016   Rt breast  . Personal history of radiation therapy 1993   Left breast  . Pneumonia July 2014    PAST SURGICAL HISTORY :   Past Surgical History:  Procedure Laterality Date  . BREAST BIOPSY Bilateral [id number]   Positive  . BREAST SURGERY Left 11-11-91   lumpectomy with radiation, Tamoxifen for 2 years.Dr. Sharlet Salina  . BREAST SURGERY Right 09/09/14   RIGHT BREAST WIDE EXCISION   . CHOLECYSTECTOMY  02/23/1972  . COLONOSCOPY    . COLONOSCOPY WITH PROPOFOL N/A 08/27/2017   Procedure: COLONOSCOPY WITH PROPOFOL;  Surgeon: Toledo, Benay Pike, MD;  Location: [facility] ENDOSCOPY;  Service: Gastroenterology;  Laterality: N/A;  . ESOPHAGOGASTRODUODENOSCOPY (EGD) WITH PROPOFOL N/A 08/26/2017  Procedure: ESOPHAGOGASTRODUODENOSCOPY (EGD) WITH PROPOFOL;  Surgeon: Toledo, Benay Pike, MD;  Location: [facility] ENDOSCOPY;  Service: Gastroenterology;  Laterality: N/A;  . KYPHOPLASTY N/A 08/17/2017   Procedure: [id number];  Surgeon: Hessie Knows, MD;  Location: [facility] ORS;  Service: Orthopedics;   Laterality: N/A;    FAMILY HISTORY :   Family History  Problem Relation Age of Onset  . Colon cancer Mother 10  . Colon cancer Brother 63  . Breast cancer Sister 41    SOCIAL HISTORY:   Social History   Tobacco Use  . Smoking status: Never Smoker  . Smokeless tobacco: Never Used  Substance Use Topics  . Alcohol use: No    Alcohol/week: 0.0 standard drinks  . Drug use: No    ALLERGIES:  is allergic to amoxicillin-pot clavulanate; other; and sulfa antibiotics.  MEDICATIONS:  Current Outpatient Medications  Medication Sig Dispense Refill  . acetaminophen (TYLENOL) 650 MG CR tablet Take 1 tablet by mouth daily.    Marland Kitchen amiodarone (PACERONE) 200 MG tablet Take 1 tablet (200 mg total) by mouth 2 (two) times daily. 60 tablet 0  . amLODipine (NORVASC) 5 MG tablet Take 1 tablet by mouth daily.    . Iron, Ferrous Gluconate, 256 (28 Fe) MG TABS Take by mouth daily.    Marland Kitchen letrozole (FEMARA) 2.5 MG tablet TAKE 1 TABLET BY MOUTH DAILY 270 tablet 0  . meclizine (ANTIVERT) 25 MG tablet Take 25 mg by mouth 2 (two) times daily as needed for dizziness.    Marland Kitchen PROAIR HFA 108 (90 Base) MCG/ACT inhaler Inhale 1 puff into the lungs every 6 (six) hours as needed for wheezing or shortness of breath.     . vitamin B-12 (CYANOCOBALAMIN) 250 MCG tablet Take 250 mcg by mouth daily.    Marland Kitchen levETIRAcetam (KEPPRA) 1000 MG tablet Take 1 tablet by mouth daily.    . Magnesium 250 MG TABS Take 1 tablet by mouth daily.     . metoprolol succinate (TOPROL-XL) 25 MG 24 hr tablet     . metoprolol tartrate (LOPRESSOR) 25 MG tablet Take 25 mg by mouth daily.    Marland Kitchen omeprazole (PRILOSEC) 40 MG capsule Take 40 mg by mouth daily.    . pantoprazole (PROTONIX) 40 MG tablet Take 1 tablet (40 mg total) by mouth daily. (Patient not taking: Reported on 11/26/2018) 30 tablet 2  . simvastatin (ZOCOR) 20 MG tablet Take 1 tablet by mouth daily.    . traMADol (ULTRAM) 50 MG tablet Take 1 tablet (50 mg total) by mouth 2 (two) times daily.  (Patient not taking: Reported on 11/26/2018) 20 tablet 0   No current facility-administered medications for this visit.     PHYSICAL EXAMINATION: ECOG PERFORMANCE STATUS: 0 - Asymptomatic  BP 140/63 (BP Location: Left Arm)   Pulse (!) 57   Temp (!) 97 F (36.1 C) (Tympanic)   Resp 18   Wt 159 lb 3.2 oz (72.2 kg)   BMI 26.49 kg/m   Filed Weights   11/26/18 1403  Weight: 159 lb 3.2 oz (72.2 kg)   Physical Exam  Constitutional: She is oriented to person, place, and time and well-developed, well-nourished, and in no distress.  Patient is alone.  Patient is walking herself.  HENT:  Head: Normocephalic and atraumatic.  Mouth/Throat: Oropharynx is clear and moist. No oropharyngeal exudate.  Eyes: Pupils are equal, round, and reactive to light.  Neck: Normal range of motion. Neck supple.  Cardiovascular: Normal rate and regular rhythm.  Pulmonary/Chest: Effort normal and  breath sounds normal. No respiratory distress. She has no wheezes.  Abdominal: Soft. Bowel sounds are normal. She exhibits no distension and no mass. There is no abdominal tenderness. There is no rebound and no guarding.  Musculoskeletal: Normal range of motion.        General: No tenderness or edema.  Neurological: She is alert and oriented to person, place, and time.  Skin: Skin is warm.  Psychiatric: Affect normal.       LABORATORY DATA:  I have reviewed the data as listed    Component Value Date/Time   NA 138 11/26/2018 1257   NA 135 09/01/2014 1506   K 4.1 11/26/2018 1257   K 4.1 09/01/2014 1506   CL 104 11/26/2018 1257   CL 98 (L) 09/01/2014 1506   CO2 22 11/26/2018 1257   CO2 30 09/01/2014 1506   GLUCOSE 108 (H) 11/26/2018 1257   GLUCOSE 114 (H) 09/01/2014 1506   BUN 32 (H) 11/26/2018 1257   BUN 19 09/01/2014 1506   CREATININE 1.42 (H) 11/26/2018 1257   CREATININE 0.91 09/01/2014 1506   CALCIUM 8.9 11/26/2018 1257   CALCIUM 9.5 09/01/2014 1506   PROT 7.6 05/28/2018 1308   PROT 7.8  09/01/2014 1506   ALBUMIN 4.0 05/28/2018 1308   ALBUMIN 4.2 09/01/2014 1506   AST 26 05/28/2018 1308   AST 29 09/01/2014 1506   ALT 24 05/28/2018 1308   ALT 19 09/01/2014 1506   ALKPHOS 90 05/28/2018 1308   ALKPHOS 65 09/01/2014 1506   BILITOT 0.5 05/28/2018 1308   BILITOT 0.5 09/01/2014 1506   GFRNONAA 32 (L) 11/26/2018 1257   GFRNONAA 57 (L) 09/01/2014 1506   GFRAA 37 (L) 11/26/2018 1257   GFRAA >60 09/01/2014 1506    No results found for: SPEP, UPEP  Lab Results  Component Value Date   WBC 8.0 11/26/2018   NEUTROABS 4.9 11/26/2018   HGB 13.2 11/26/2018   HCT 38.7 11/26/2018   MCV 96.0 11/26/2018   PLT 231 11/26/2018      Chemistry      Component Value Date/Time   NA 138 11/26/2018 1257   NA 135 09/01/2014 1506   K 4.1 11/26/2018 1257   K 4.1 09/01/2014 1506   CL 104 11/26/2018 1257   CL 98 (L) 09/01/2014 1506   CO2 22 11/26/2018 1257   CO2 30 09/01/2014 1506   BUN 32 (H) 11/26/2018 1257   BUN 19 09/01/2014 1506   CREATININE 1.42 (H) 11/26/2018 1257   CREATININE 0.91 09/01/2014 1506      Component Value Date/Time   CALCIUM 8.9 11/26/2018 1257   CALCIUM 9.5 09/01/2014 1506   ALKPHOS 90 05/28/2018 1308   ALKPHOS 65 09/01/2014 1506   AST 26 05/28/2018 1308   AST 29 09/01/2014 1506   ALT 24 05/28/2018 1308   ALT 19 09/01/2014 1506   BILITOT 0.5 05/28/2018 1308   BILITOT 0.5 09/01/2014 1506       RADIOGRAPHIC STUDIES: I have personally reviewed the radiological images as listed and agreed with the findings in the report. No results found.   ASSESSMENT & PLAN:  Malignant neoplasm of upper-inner quadrant of right breast in female, estrogen receptor positive ([place]) # Breast cancer- Stage I- ER/PR positive; Her 2 neu- Neg; on femara; ca+vit D.  Stable.  Mammogram August 2020 pending/ordered.  #Anemia iron deficiency- sec GIB-  Hemoglobin 13.  Continue p.o. iron. Stable.   # Increase in creatinine- 1.4- ? Etiology.  Recommend avoiding nephrotoxic agents.  Recommend repeating BMP in 2 weeks.  If not improved/worsening would recommend further work-up.  # DISPOSITION: Discussed with the patient's daughter Joycelyn Schmid over the phone [phone].   # NO venofer today.  # BMP in 2 weeks # follow up in 6 months MD/ labs-cbc/iron studies/ferritin/bmp/possible Venofer-Dr.B    Orders Placed This Encounter  Procedures  . Basic metabolic panel    Standing Status:   Future    Standing Expiration Date:   11/26/2019  . CBC with Differential    Standing Status:   Future    Standing Expiration Date:   11/26/2019  . Ferritin    Standing Status:   Future    Standing Expiration Date:   11/26/2019  . Basic metabolic panel    Standing Status:   Future    Standing Expiration Date:   11/26/2019  . Iron and TIBC    Standing Status:   Future    Standing Expiration Date:   11/26/2019   All questions were answered. The patient knows to call the clinic with any problems, questions or concerns.      Cammie Sickle, MD 11/26/2018 8:28 PM

## 2018-12-10 ENCOUNTER — Inpatient Hospital Stay: Payer: Medicare Other

## 2018-12-10 ENCOUNTER — Encounter: Payer: Self-pay | Admitting: General Surgery

## 2018-12-24 ENCOUNTER — Ambulatory Visit
Admission: RE | Admit: 2018-12-24 | Discharge: 2018-12-24 | Disposition: A | Discharge status: Home / Self Care | Payer: Medicare Other | Source: Ambulatory Visit | Attending: General Surgery | Admitting: General Surgery

## 2018-12-24 DIAGNOSIS — Z170 Estrogen receptor positive status [ER+]: Secondary | ICD-10-CM | POA: Insufficient documentation

## 2018-12-24 DIAGNOSIS — C50211 Malignant neoplasm of upper-inner quadrant of right female breast: Secondary | ICD-10-CM | POA: Insufficient documentation

## 2019-01-01 ENCOUNTER — Ambulatory Visit: Payer: Medicare Other | Admitting: General Surgery

## 2019-01-02 ENCOUNTER — Ambulatory Visit: Payer: Medicare Other | Admitting: Surgery

## 2019-01-09 ENCOUNTER — Other Ambulatory Visit: Payer: Self-pay | Admitting: Nephrology

## 2019-01-09 DIAGNOSIS — N183 Chronic kidney disease, stage 3 unspecified: Secondary | ICD-10-CM

## 2019-01-09 DIAGNOSIS — N179 Acute kidney failure, unspecified: Secondary | ICD-10-CM

## 2019-01-18 ENCOUNTER — Other Ambulatory Visit: Payer: Self-pay

## 2019-01-18 ENCOUNTER — Ambulatory Visit
Admission: RE | Admit: 2019-01-18 | Discharge: 2019-01-18 | Disposition: A | Discharge status: Home / Self Care | Payer: Medicare Other | Source: Ambulatory Visit | Attending: Nephrology | Admitting: Nephrology

## 2019-01-18 DIAGNOSIS — N183 Chronic kidney disease, stage 3 unspecified: Secondary | ICD-10-CM

## 2019-01-18 DIAGNOSIS — N179 Acute kidney failure, unspecified: Secondary | ICD-10-CM | POA: Diagnosis not present

## 2019-03-12 ENCOUNTER — Encounter: Payer: Self-pay | Admitting: *Deleted

## 2019-04-29 ENCOUNTER — Other Ambulatory Visit: Payer: Self-pay | Admitting: *Deleted

## 2019-04-29 MED ORDER — LETROZOLE 2.5 MG PO TABS: 2.5000 mg | ORAL_TABLET | Freq: Every day | ORAL | 0 refills | Status: DC

## 2019-05-27 ENCOUNTER — Other Ambulatory Visit: Payer: Self-pay

## 2019-05-27 ENCOUNTER — Other Ambulatory Visit: Payer: Self-pay | Admitting: *Deleted

## 2019-05-27 ENCOUNTER — Inpatient Hospital Stay (HOSPITAL_BASED_OUTPATIENT_CLINIC_OR_DEPARTMENT_OTHER): Payer: Medicare Other | Admitting: Internal Medicine

## 2019-05-27 ENCOUNTER — Inpatient Hospital Stay: Payer: Medicare Other | Attending: Internal Medicine

## 2019-05-27 ENCOUNTER — Inpatient Hospital Stay: Payer: Medicare Other

## 2019-05-27 DIAGNOSIS — N183 Chronic kidney disease, stage 3 unspecified: Secondary | ICD-10-CM | POA: Diagnosis not present

## 2019-05-27 DIAGNOSIS — Z8701 Personal history of pneumonia (recurrent): Secondary | ICD-10-CM | POA: Insufficient documentation

## 2019-05-27 DIAGNOSIS — K922 Gastrointestinal hemorrhage, unspecified: Secondary | ICD-10-CM | POA: Insufficient documentation

## 2019-05-27 DIAGNOSIS — D500 Iron deficiency anemia secondary to blood loss (chronic): Secondary | ICD-10-CM | POA: Diagnosis not present

## 2019-05-27 DIAGNOSIS — C50211 Malignant neoplasm of upper-inner quadrant of right female breast: Secondary | ICD-10-CM

## 2019-05-27 DIAGNOSIS — Z170 Estrogen receptor positive status [ER+]: Secondary | ICD-10-CM | POA: Insufficient documentation

## 2019-05-27 DIAGNOSIS — M545 Low back pain: Secondary | ICD-10-CM | POA: Diagnosis not present

## 2019-05-27 DIAGNOSIS — Z923 Personal history of irradiation: Secondary | ICD-10-CM | POA: Insufficient documentation

## 2019-05-27 DIAGNOSIS — Z800 Family history of malignant neoplasm of digestive organs: Secondary | ICD-10-CM | POA: Diagnosis not present

## 2019-05-27 DIAGNOSIS — Z79811 Long term (current) use of aromatase inhibitors: Secondary | ICD-10-CM | POA: Insufficient documentation

## 2019-05-27 DIAGNOSIS — J449 Chronic obstructive pulmonary disease, unspecified: Secondary | ICD-10-CM | POA: Insufficient documentation

## 2019-05-27 DIAGNOSIS — Z803 Family history of malignant neoplasm of breast: Secondary | ICD-10-CM | POA: Insufficient documentation

## 2019-05-27 DIAGNOSIS — M25552 Pain in left hip: Secondary | ICD-10-CM | POA: Insufficient documentation

## 2019-05-27 LAB — CBC WITH DIFFERENTIAL/PLATELET
Abs Immature Granulocytes: 0.03 10*3/uL (ref 0.00–0.07)
Basophils Absolute: 0.1 10*3/uL (ref 0.0–0.1)
Basophils Relative: 1 %
Eosinophils Absolute: 0.2 10*3/uL (ref 0.0–0.5)
Eosinophils Relative: 2 %
HCT: 39.9 % (ref 36.0–46.0)
Hemoglobin: 13.2 g/dL (ref 12.0–15.0)
Immature Granulocytes: 0 %
Lymphocytes Relative: 21 %
Lymphs Abs: 1.9 10*3/uL (ref 0.7–4.0)
MCH: 32 pg (ref 26.0–34.0)
MCHC: 33.1 g/dL (ref 30.0–36.0)
MCV: 96.6 fL (ref 80.0–100.0)
Monocytes Absolute: 0.8 10*3/uL (ref 0.1–1.0)
Monocytes Relative: 9 %
Neutro Abs: 6 10*3/uL (ref 1.7–7.7)
Neutrophils Relative %: 67 %
Platelets: 211 10*3/uL (ref 150–400)
RBC: 4.13 MIL/uL (ref 3.87–5.11)
RDW: 12.7 % (ref 11.5–15.5)
WBC: 9 10*3/uL (ref 4.0–10.5)
nRBC: 0 % (ref 0.0–0.2)

## 2019-05-27 LAB — BASIC METABOLIC PANEL
Anion gap: 8 (ref 5–15)
BUN: 28 mg/dL — ABNORMAL HIGH (ref 8–23)
CO2: 25 mmol/L (ref 22–32)
Calcium: 8.8 mg/dL — ABNORMAL LOW (ref 8.9–10.3)
Chloride: 102 mmol/L (ref 98–111)
Creatinine, Ser: 1.2 mg/dL — ABNORMAL HIGH (ref 0.44–1.00)
GFR calc Af Amer: 45 mL/min — ABNORMAL LOW (ref >60)
GFR calc non Af Amer: 39 mL/min — ABNORMAL LOW (ref >60)
Glucose, Bld: 99 mg/dL (ref 70–99)
Potassium: 3.8 mmol/L (ref 3.5–5.1)
Sodium: 135 mmol/L (ref 135–145)

## 2019-05-27 LAB — IRON AND TIBC
Iron: 186 ug/dL — ABNORMAL HIGH (ref 28–170)
Saturation Ratios: 52 % — ABNORMAL HIGH (ref 10.4–31.8)
TIBC: 358 ug/dL (ref 250–450)
UIBC: 172 ug/dL

## 2019-05-27 LAB — FERRITIN: Ferritin: 46 ng/mL (ref 11–307)

## 2019-05-27 MED ORDER — LETROZOLE 2.5 MG PO TABS: 2.5000 mg | ORAL_TABLET | Freq: Every day | ORAL | 1 refills | Status: AC

## 2019-05-27 NOTE — Progress Notes (Signed)
[place] OFFICE PROGRESS NOTE  Patient Care Team: [name], MD as PCP - General (Family Medicine) Christene Lye, MD (General Surgery) Forest Gleason, MD (Inactive) (Oncology)  Cancer Staging Carcinoma of right breast [facility]) Staging form: Breast, AJCC 7th Edition - Clinical stage from 09/19/2014: Stage IA (yT1c, N0, M0) - Unsigned    Oncology History Overview Note  # March, 2016- LEFT BREAST INVASIVE CA;  Lumpec & sentinel lymph node biopsy [Dr.sankar].  Low-grade tumor estrogen and progesterone receptor positive HER-2 receptor negative T1 cN0 M0.STAGE I S/p RT. Femara  # 2.  Left breast carcinoma breast 22 years ago patient had lumpectomy radiation therapy and 2 years of tamoxifen.  # IDA chronic-bleedin ulcer s/p EGD [facility] 2019- Dr.Toledo] -----------------------------------------   DIAGNOSIS: BREAST CANCER  STAGE:  I       ;GOALS: cure  CURRENT/MOST RECENT THERAPY: Femara    Carcinoma of right breast ([place])  10/12/2014 Initial Diagnosis   Carcinoma of left breast   Malignant neoplasm of upper-inner quadrant of right breast in female, estrogen receptor positive ([place])      INTERVAL HISTORY:  Kristy Hardy 86 y.o.  female pleasant patient above history of early stage breast cancer/iron deficient anemia secondary to GI bleed is here for follow-up.  In the interim patient was evaluated by nephrology for chronic kidney disease.  Ultrasound negative for any obstruction.  Chronic mild fatigue.  Otherwise no nausea no vomiting.  She continues him on iron pills.   Review of Systems  Constitutional: Negative for chills, diaphoresis, fever and weight loss.  HENT: Negative for nosebleeds and sore throat.   Eyes: Negative for double vision.  Respiratory: Negative for cough, hemoptysis, sputum production, shortness of breath and wheezing.   Cardiovascular: Negative for chest pain, palpitations, orthopnea and leg swelling.  Gastrointestinal:  Negative for abdominal pain, blood in stool, constipation, diarrhea, heartburn, melena, nausea and vomiting.  Genitourinary: Negative for dysuria, frequency and urgency.  Musculoskeletal: Positive for back pain and joint pain.  Skin: Negative.  Negative for itching and rash.  Neurological: Negative for tingling, focal weakness, weakness and headaches.  Endo/Heme/Allergies: Does not bruise/bleed easily.  Psychiatric/Behavioral: Negative for depression. The patient is not nervous/anxious and does not have insomnia.       PAST MEDICAL HISTORY :  Past Medical History:  Diagnosis Date  . Arthritis   . Atherosclerosis 2019   Dr Ubaldo Glassing  . Breast cancer ([place])   . Cancer ([place]) 11-11-91   left breast   . Cancer ([place]) 08-12-14   right breast, ER/PR positive, Her2 negative  . COPD (chronic obstructive pulmonary disease) ([place])   . Heart valve problem    leaking  . History of colonoscopy 2012  . History of mammogram 2016  . Personal history of radiation therapy 2016   Rt breast  . Personal history of radiation therapy 1993   Left breast  . Pneumonia July 2014    PAST SURGICAL HISTORY :   Past Surgical History:  Procedure Laterality Date  . BREAST BIOPSY Bilateral [id number]   Positive  . BREAST LUMPECTOMY Left 1993  . BREAST LUMPECTOMY Right 2016  . BREAST SURGERY Left 11-11-91   lumpectomy with radiation, Tamoxifen for 2 years.Dr. Sharlet Salina  . BREAST SURGERY Right 09/09/14   RIGHT BREAST WIDE EXCISION   . CHOLECYSTECTOMY  02/23/1972  . COLONOSCOPY    . COLONOSCOPY WITH PROPOFOL N/A 08/27/2017   Procedure: COLONOSCOPY WITH PROPOFOL;  Surgeon: Alice Reichert, Benay Pike, MD;  Location[facility]  ENDOSCOPY;  Service: Gastroenterology;  Laterality: N/A;  . ESOPHAGOGASTRODUODENOSCOPY (EGD) WITH PROPOFOL N/A 08/26/2017   Procedure: ESOPHAGOGASTRODUODENOSCOPY (EGD) WITH PROPOFOL;  Surgeon: Toledo, Benay Pike, MD;  Location: [facility] ENDOSCOPY;  Service: Gastroenterology;  Laterality: N/A;  . KYPHOPLASTY N/A 08/17/2017    Procedure: [id number];  Surgeon: Hessie Knows, MD;  Location: [facility] ORS;  Service: Orthopedics;  Laterality: N/A;    FAMILY HISTORY :   Family History  Problem Relation Age of Onset  . Colon cancer Mother 32  . Colon cancer Brother 72  . Breast cancer Sister 32    SOCIAL HISTORY:   Social History   Tobacco Use  . Smoking status: Never Smoker  . Smokeless tobacco: Never Used  Substance Use Topics  . Alcohol use: No    Alcohol/week: 0.0 standard drinks  . Drug use: No    ALLERGIES:  is allergic to amoxicillin-pot clavulanate; other; and sulfa antibiotics.  MEDICATIONS:  Current Outpatient Medications  Medication Sig Dispense Refill  . amiodarone (PACERONE) 200 MG tablet Take 1 tablet (200 mg total) by mouth 2 (two) times daily. 60 tablet 0  . amLODipine (NORVASC) 5 MG tablet Take 1 tablet by mouth daily.    . Iron, Ferrous Gluconate, 256 (28 Fe) MG TABS Take by mouth daily.    Marland Kitchen letrozole (FEMARA) 2.5 MG tablet Take 1 tablet (2.5 mg total) by mouth daily. 90 tablet 1  . levETIRAcetam (KEPPRA) 1000 MG tablet Take 1 tablet by mouth daily.    . Magnesium 250 MG TABS Take 1 tablet by mouth daily.     . meclizine (ANTIVERT) 25 MG tablet Take 25 mg by mouth 2 (two) times daily as needed for dizziness.    . metoprolol succinate (TOPROL-XL) 25 MG 24 hr tablet     . metoprolol tartrate (LOPRESSOR) 25 MG tablet Take 25 mg by mouth daily.    Marland Kitchen PROAIR HFA 108 (90 Base) MCG/ACT inhaler Inhale 1 puff into the lungs every 6 (six) hours as needed for wheezing or shortness of breath.     . traMADol (ULTRAM) 50 MG tablet Take 1 tablet (50 mg total) by mouth 2 (two) times daily. 20 tablet 0  . vitamin B-12 (CYANOCOBALAMIN) 250 MCG tablet Take 250 mcg by mouth daily.    Marland Kitchen acetaminophen (TYLENOL) 650 MG CR tablet Take 1 tablet by mouth daily.    Marland Kitchen omeprazole (PRILOSEC) 40 MG capsule Take 40 mg by mouth daily.    . pantoprazole (PROTONIX) 40 MG tablet Take 1 tablet (40 mg total) by mouth  daily. (Patient not taking: Reported on 11/26/2018) 30 tablet 2  . simvastatin (ZOCOR) 20 MG tablet Take 1 tablet by mouth daily.     No current facility-administered medications for this visit.    PHYSICAL EXAMINATION: ECOG PERFORMANCE STATUS: 0 - Asymptomatic  BP 129/63 (BP Location: Left Arm, Patient Position: Sitting, Cuff Size: Normal)   Pulse (!) 54   Temp (!) 96.7 F (35.9 C) (Tympanic)   Wt 150 lb (68 kg)   BMI 24.96 kg/m   Filed Weights   05/27/19 1321  Weight: 150 lb (68 kg)   Physical Exam  Constitutional: She is oriented to person, place, and time and well-developed, well-nourished, and in no distress.  Patient is accompanied by daughter.  Patient is walking herself.  HENT:  Head: Normocephalic and atraumatic.  Mouth/Throat: Oropharynx is clear and moist. No oropharyngeal exudate.  Eyes: Pupils are equal, round, and reactive to light.  Cardiovascular: Normal rate and regular rhythm.  Pulmonary/Chest: Effort normal and breath sounds normal. No respiratory distress. She has no wheezes.  Abdominal: Soft. Bowel sounds are normal. She exhibits no distension and no mass. There is no abdominal tenderness. There is no rebound and no guarding.  Musculoskeletal:        General: No tenderness or edema. Normal range of motion.     Cervical back: Normal range of motion and neck supple.  Neurological: She is alert and oriented to person, place, and time.  Skin: Skin is warm.  Psychiatric: Affect normal.       LABORATORY DATA:  I have reviewed the data as listed    Component Value Date/Time   NA 135 05/27/2019 1254   NA 135 09/01/2014 1506   K 3.8 05/27/2019 1254   K 4.1 09/01/2014 1506   CL 102 05/27/2019 1254   CL 98 (L) 09/01/2014 1506   CO2 25 05/27/2019 1254   CO2 30 09/01/2014 1506   GLUCOSE 99 05/27/2019 1254   GLUCOSE 114 (H) 09/01/2014 1506   BUN 28 (H) 05/27/2019 1254   BUN 19 09/01/2014 1506   CREATININE 1.20 (H) 05/27/2019 1254   CREATININE 0.91  09/01/2014 1506   CALCIUM 8.8 (L) 05/27/2019 1254   CALCIUM 9.5 09/01/2014 1506   PROT 7.6 05/28/2018 1308   PROT 7.8 09/01/2014 1506   ALBUMIN 4.0 05/28/2018 1308   ALBUMIN 4.2 09/01/2014 1506   AST 26 05/28/2018 1308   AST 29 09/01/2014 1506   ALT 24 05/28/2018 1308   ALT 19 09/01/2014 1506   ALKPHOS 90 05/28/2018 1308   ALKPHOS 65 09/01/2014 1506   BILITOT 0.5 05/28/2018 1308   BILITOT 0.5 09/01/2014 1506   GFRNONAA 39 (L) 05/27/2019 1254   GFRNONAA 57 (L) 09/01/2014 1506   GFRAA 45 (L) 05/27/2019 1254   GFRAA >60 09/01/2014 1506    No results found for: SPEP, UPEP  Lab Results  Component Value Date   WBC 9.0 05/27/2019   NEUTROABS 6.0 05/27/2019   HGB 13.2 05/27/2019   HCT 39.9 05/27/2019   MCV 96.6 05/27/2019   PLT 211 05/27/2019      Chemistry      Component Value Date/Time   NA 135 05/27/2019 1254   NA 135 09/01/2014 1506   K 3.8 05/27/2019 1254   K 4.1 09/01/2014 1506   CL 102 05/27/2019 1254   CL 98 (L) 09/01/2014 1506   CO2 25 05/27/2019 1254   CO2 30 09/01/2014 1506   BUN 28 (H) 05/27/2019 1254   BUN 19 09/01/2014 1506   CREATININE 1.20 (H) 05/27/2019 1254   CREATININE 0.91 09/01/2014 1506      Component Value Date/Time   CALCIUM 8.8 (L) 05/27/2019 1254   CALCIUM 9.5 09/01/2014 1506   ALKPHOS 90 05/28/2018 1308   ALKPHOS 65 09/01/2014 1506   AST 26 05/28/2018 1308   AST 29 09/01/2014 1506   ALT 24 05/28/2018 1308   ALT 19 09/01/2014 1506   BILITOT 0.5 05/28/2018 1308   BILITOT 0.5 09/01/2014 1506       RADIOGRAPHIC STUDIES: I have personally reviewed the radiological images as listed and agreed with the findings in the report. No results found.   ASSESSMENT & PLAN:  Malignant neoplasm of upper-inner quadrant of right breast in female, estrogen receptor positive ([place]) # Breast cancer- Stage I- ER/PR positive; Her 2 neu- Neg; on femara; ca+vit D.  Stable.  Mammogram August 2020- WNL. Finishes 5 years of letrzole [spring 6256]; recommend  proceeding with  finishing off available medication; and stop.  No more refills needed.  #Anemia iron deficiency- sec GIB-  Hemoglobin 13.  Continue p.o. iron. Stable.   # CKD stage III- stable; - 1.2; GFR-39; stable. [dr.Singh] s/p US-no obstruction.   # Left hip pain/low back pain-awaiting evaluation with Dr.Menz.   # DISPOSITION:  # No venofer today.  # follow up in 6 months MD/ labs-cbc/iron studies/ferritin/bmp/possible Venofer [pt pref]-Dr.B    Orders Placed This Encounter  Procedures  . CBC with Differential    Standing Status:   Future    Standing Expiration Date:   05/26/2020  . Basic metabolic panel    Standing Status:   Future    Standing Expiration Date:   05/26/2020  . Ferritin    Standing Status:   Future    Standing Expiration Date:   05/26/2020  . Iron and TIBC    Standing Status:   Future    Standing Expiration Date:   05/26/2020   All questions were answered. The patient knows to call the clinic with any problems, questions or concerns.      Cammie Sickle, MD 05/27/2019 2:58 PM

## 2019-05-27 NOTE — Assessment & Plan Note (Addendum)
#   Breast cancer- Stage I- ER/PR positive; Her 2 neu- Neg; on femara; ca+vit D.  Stable.  Mammogram August 2020- WNL. Finishes 5 years of letrzole [spring [id number]; recommend proceeding with finishing off available medication; and stop.  No more refills needed.  #Anemia iron deficiency- sec GIB-  Hemoglobin 13.  Continue p.o. iron. Stable.   # CKD stage III- stable; - 1.2; GFR-39; stable. [dr.Singh] s/p US-no obstruction.   # Left hip pain/low back pain-awaiting evaluation with Dr.Menz.   # DISPOSITION:  # No venofer today.  # follow up in 6 months MD/ labs-cbc/iron studies/ferritin/bmp/possible Venofer [pt pref]-Dr.B

## 2019-05-27 NOTE — Patient Instructions (Signed)
#   STOP letrozole after you finish your current medications.

## 2019-11-26 ENCOUNTER — Inpatient Hospital Stay: Payer: Medicare Other

## 2019-11-26 ENCOUNTER — Inpatient Hospital Stay: Payer: Medicare Other | Admitting: Internal Medicine

## 2019-12-03 ENCOUNTER — Other Ambulatory Visit: Payer: Self-pay

## 2019-12-03 ENCOUNTER — Emergency Department: Payer: Medicare Other

## 2019-12-03 DIAGNOSIS — R410 Disorientation, unspecified: Secondary | ICD-10-CM | POA: Diagnosis present

## 2019-12-03 DIAGNOSIS — R42 Dizziness and giddiness: Secondary | ICD-10-CM | POA: Diagnosis present

## 2019-12-03 DIAGNOSIS — N179 Acute kidney failure, unspecified: Secondary | ICD-10-CM | POA: Diagnosis present

## 2019-12-03 DIAGNOSIS — J449 Chronic obstructive pulmonary disease, unspecified: Secondary | ICD-10-CM | POA: Diagnosis present

## 2019-12-03 DIAGNOSIS — G40909 Epilepsy, unspecified, not intractable, without status epilepticus: Secondary | ICD-10-CM | POA: Diagnosis present

## 2019-12-03 DIAGNOSIS — G8929 Other chronic pain: Secondary | ICD-10-CM | POA: Diagnosis present

## 2019-12-03 DIAGNOSIS — Z170 Estrogen receptor positive status [ER+]: Secondary | ICD-10-CM

## 2019-12-03 DIAGNOSIS — Z882 Allergy status to sulfonamides status: Secondary | ICD-10-CM

## 2019-12-03 DIAGNOSIS — Z20822 Contact with and (suspected) exposure to covid-19: Secondary | ICD-10-CM | POA: Diagnosis present

## 2019-12-03 DIAGNOSIS — Z9049 Acquired absence of other specified parts of digestive tract: Secondary | ICD-10-CM

## 2019-12-03 DIAGNOSIS — Z79891 Long term (current) use of opiate analgesic: Secondary | ICD-10-CM

## 2019-12-03 DIAGNOSIS — R55 Syncope and collapse: Secondary | ICD-10-CM | POA: Diagnosis not present

## 2019-12-03 DIAGNOSIS — M4856XA Collapsed vertebra, not elsewhere classified, lumbar region, initial encounter for fracture: Secondary | ICD-10-CM | POA: Diagnosis present

## 2019-12-03 DIAGNOSIS — N1832 Chronic kidney disease, stage 3b: Secondary | ICD-10-CM | POA: Diagnosis present

## 2019-12-03 DIAGNOSIS — F0390 Unspecified dementia without behavioral disturbance: Secondary | ICD-10-CM | POA: Diagnosis present

## 2019-12-03 DIAGNOSIS — Z803 Family history of malignant neoplasm of breast: Secondary | ICD-10-CM

## 2019-12-03 DIAGNOSIS — I951 Orthostatic hypotension: Principal | ICD-10-CM | POA: Diagnosis present

## 2019-12-03 DIAGNOSIS — M549 Dorsalgia, unspecified: Secondary | ICD-10-CM | POA: Diagnosis present

## 2019-12-03 DIAGNOSIS — E860 Dehydration: Secondary | ICD-10-CM | POA: Diagnosis present

## 2019-12-03 DIAGNOSIS — Z79899 Other long term (current) drug therapy: Secondary | ICD-10-CM

## 2019-12-03 DIAGNOSIS — E876 Hypokalemia: Secondary | ICD-10-CM | POA: Diagnosis present

## 2019-12-03 DIAGNOSIS — Z923 Personal history of irradiation: Secondary | ICD-10-CM

## 2019-12-03 DIAGNOSIS — Z79811 Long term (current) use of aromatase inhibitors: Secondary | ICD-10-CM

## 2019-12-03 DIAGNOSIS — Z800 Family history of malignant neoplasm of digestive organs: Secondary | ICD-10-CM

## 2019-12-03 DIAGNOSIS — Z880 Allergy status to penicillin: Secondary | ICD-10-CM

## 2019-12-03 DIAGNOSIS — I350 Nonrheumatic aortic (valve) stenosis: Secondary | ICD-10-CM | POA: Diagnosis present

## 2019-12-03 DIAGNOSIS — X58XXXA Exposure to other specified factors, initial encounter: Secondary | ICD-10-CM | POA: Diagnosis present

## 2019-12-03 DIAGNOSIS — C50211 Malignant neoplasm of upper-inner quadrant of right female breast: Secondary | ICD-10-CM | POA: Diagnosis present

## 2019-12-03 DIAGNOSIS — I129 Hypertensive chronic kidney disease with stage 1 through stage 4 chronic kidney disease, or unspecified chronic kidney disease: Secondary | ICD-10-CM | POA: Diagnosis present

## 2019-12-03 LAB — BASIC METABOLIC PANEL
Anion gap: 10 (ref 5–15)
BUN: 25 mg/dL — ABNORMAL HIGH (ref 8–23)
CO2: 31 mmol/L (ref 22–32)
Calcium: 9.1 mg/dL (ref 8.9–10.3)
Chloride: 101 mmol/L (ref 98–111)
Creatinine, Ser: 1.48 mg/dL — ABNORMAL HIGH (ref 0.44–1.00)
GFR calc Af Amer: 35 mL/min — ABNORMAL LOW (ref >60)
GFR calc non Af Amer: 30 mL/min — ABNORMAL LOW (ref >60)
Glucose, Bld: 130 mg/dL — ABNORMAL HIGH (ref 70–99)
Potassium: 3.9 mmol/L (ref 3.5–5.1)
Sodium: 142 mmol/L (ref 135–145)

## 2019-12-03 LAB — CBC
HCT: 35.9 % — ABNORMAL LOW (ref 36.0–46.0)
Hemoglobin: 12.8 g/dL (ref 12.0–15.0)
MCH: 33.6 pg (ref 26.0–34.0)
MCHC: 35.7 g/dL (ref 30.0–36.0)
MCV: 94.2 fL (ref 80.0–100.0)
Platelets: 210 10*3/uL (ref 150–400)
RBC: 3.81 MIL/uL — ABNORMAL LOW (ref 3.87–5.11)
RDW: 12.7 % (ref 11.5–15.5)
WBC: 11.9 10*3/uL — ABNORMAL HIGH (ref 4.0–10.5)
nRBC: 0 % (ref 0.0–0.2)

## 2019-12-03 NOTE — ED Triage Notes (Signed)
Pt arrives to ED via [facility] from home with c/o vertigo. Pt states she was with family "and kept feeling things spin round and round". Pt denies falling and any actual seizure; reports last seizure was "20 years ago". Pt reports back pain that started 10pm last night. Pt denies urinary s/x's or c/o's. Pt is A&O, in NAD; RR even, regular, and unlabored.

## 2019-12-04 ENCOUNTER — Observation Stay: Payer: Medicare Other

## 2019-12-04 ENCOUNTER — Other Ambulatory Visit: Payer: Self-pay

## 2019-12-04 ENCOUNTER — Inpatient Hospital Stay
Admission: EM | Admit: 2019-12-04 | Discharge: 2019-12-06 | DRG: 312 | Disposition: A | Discharge status: Skilled Nursing Facility | Payer: Medicare Other | Attending: Internal Medicine | Admitting: Internal Medicine

## 2019-12-04 ENCOUNTER — Observation Stay (HOSPITAL_COMMUNITY)
Admit: 2019-12-04 | Discharge: 2019-12-04 | Disposition: A | Discharge status: Home / Self Care | Payer: Medicare Other | Attending: Internal Medicine | Admitting: Internal Medicine

## 2019-12-04 DIAGNOSIS — Z880 Allergy status to penicillin: Secondary | ICD-10-CM | POA: Diagnosis not present

## 2019-12-04 DIAGNOSIS — I350 Nonrheumatic aortic (valve) stenosis: Secondary | ICD-10-CM | POA: Diagnosis present

## 2019-12-04 DIAGNOSIS — G8929 Other chronic pain: Secondary | ICD-10-CM | POA: Diagnosis present

## 2019-12-04 DIAGNOSIS — R55 Syncope and collapse: Secondary | ICD-10-CM | POA: Diagnosis present

## 2019-12-04 DIAGNOSIS — Z803 Family history of malignant neoplasm of breast: Secondary | ICD-10-CM | POA: Diagnosis not present

## 2019-12-04 DIAGNOSIS — Z800 Family history of malignant neoplasm of digestive organs: Secondary | ICD-10-CM | POA: Diagnosis not present

## 2019-12-04 DIAGNOSIS — C50211 Malignant neoplasm of upper-inner quadrant of right female breast: Secondary | ICD-10-CM | POA: Diagnosis present

## 2019-12-04 DIAGNOSIS — R410 Disorientation, unspecified: Secondary | ICD-10-CM | POA: Diagnosis present

## 2019-12-04 DIAGNOSIS — X58XXXA Exposure to other specified factors, initial encounter: Secondary | ICD-10-CM | POA: Diagnosis present

## 2019-12-04 DIAGNOSIS — J449 Chronic obstructive pulmonary disease, unspecified: Secondary | ICD-10-CM

## 2019-12-04 DIAGNOSIS — Z170 Estrogen receptor positive status [ER+]: Secondary | ICD-10-CM | POA: Diagnosis not present

## 2019-12-04 DIAGNOSIS — M549 Dorsalgia, unspecified: Secondary | ICD-10-CM | POA: Diagnosis present

## 2019-12-04 DIAGNOSIS — R531 Weakness: Secondary | ICD-10-CM | POA: Diagnosis not present

## 2019-12-04 DIAGNOSIS — J4489 Other specified chronic obstructive pulmonary disease: Secondary | ICD-10-CM

## 2019-12-04 DIAGNOSIS — N1832 Chronic kidney disease, stage 3b: Secondary | ICD-10-CM | POA: Diagnosis present

## 2019-12-04 DIAGNOSIS — I361 Nonrheumatic tricuspid (valve) insufficiency: Secondary | ICD-10-CM | POA: Diagnosis not present

## 2019-12-04 DIAGNOSIS — I10 Essential (primary) hypertension: Secondary | ICD-10-CM

## 2019-12-04 DIAGNOSIS — R42 Dizziness and giddiness: Secondary | ICD-10-CM

## 2019-12-04 DIAGNOSIS — E876 Hypokalemia: Secondary | ICD-10-CM | POA: Diagnosis present

## 2019-12-04 DIAGNOSIS — I951 Orthostatic hypotension: Secondary | ICD-10-CM | POA: Diagnosis present

## 2019-12-04 DIAGNOSIS — N189 Chronic kidney disease, unspecified: Secondary | ICD-10-CM

## 2019-12-04 DIAGNOSIS — N179 Acute kidney failure, unspecified: Secondary | ICD-10-CM | POA: Diagnosis present

## 2019-12-04 DIAGNOSIS — Z882 Allergy status to sulfonamides status: Secondary | ICD-10-CM | POA: Diagnosis not present

## 2019-12-04 DIAGNOSIS — F0390 Unspecified dementia without behavioral disturbance: Secondary | ICD-10-CM | POA: Diagnosis present

## 2019-12-04 DIAGNOSIS — E860 Dehydration: Secondary | ICD-10-CM | POA: Diagnosis present

## 2019-12-04 DIAGNOSIS — Z20822 Contact with and (suspected) exposure to covid-19: Secondary | ICD-10-CM | POA: Diagnosis present

## 2019-12-04 DIAGNOSIS — Z923 Personal history of irradiation: Secondary | ICD-10-CM | POA: Diagnosis not present

## 2019-12-04 DIAGNOSIS — Z79811 Long term (current) use of aromatase inhibitors: Secondary | ICD-10-CM | POA: Diagnosis not present

## 2019-12-04 DIAGNOSIS — Z79891 Long term (current) use of opiate analgesic: Secondary | ICD-10-CM | POA: Diagnosis not present

## 2019-12-04 DIAGNOSIS — R112 Nausea with vomiting, unspecified: Secondary | ICD-10-CM

## 2019-12-04 DIAGNOSIS — G40909 Epilepsy, unspecified, not intractable, without status epilepticus: Secondary | ICD-10-CM

## 2019-12-04 DIAGNOSIS — M4856XA Collapsed vertebra, not elsewhere classified, lumbar region, initial encounter for fracture: Secondary | ICD-10-CM | POA: Diagnosis present

## 2019-12-04 LAB — TROPONIN I (HIGH SENSITIVITY)
Troponin I (High Sensitivity): 13 ng/L (ref <18)
Troponin I (High Sensitivity): 10 ng/L (ref <18)

## 2019-12-04 LAB — URINALYSIS, COMPLETE (UACMP) WITH MICROSCOPIC
Bacteria, UA: NONE SEEN
Bilirubin Urine: NEGATIVE
Glucose, UA: 50 mg/dL — AB
Hgb urine dipstick: NEGATIVE
Ketones, ur: NEGATIVE mg/dL
Nitrite: NEGATIVE
Protein, ur: NEGATIVE mg/dL
Specific Gravity, Urine: 1.009 (ref 1.005–1.030)
pH: 7 (ref 5.0–8.0)

## 2019-12-04 LAB — ECHOCARDIOGRAM COMPLETE

## 2019-12-04 LAB — SARS CORONAVIRUS 2 BY RT PCR (HOSPITAL ORDER, PERFORMED IN ~~LOC~~ HOSPITAL LAB): SARS Coronavirus 2: NEGATIVE

## 2019-12-04 LAB — GLUCOSE, CAPILLARY: Glucose-Capillary: 100 mg/dL — ABNORMAL HIGH (ref 70–99)

## 2019-12-04 MED ORDER — LEVETIRACETAM 500 MG PO TABS
1000.0000 mg | ORAL_TABLET | Freq: Once | ORAL | Status: AC
  Administered 2019-12-04: 1000 mg via ORAL
  Filled 2019-12-04: qty 2

## 2019-12-04 MED ORDER — HYDRALAZINE HCL 50 MG PO TABS: 50.0000 mg | ORAL_TABLET | Freq: Four times a day (QID) | ORAL | Status: DC | PRN

## 2019-12-04 MED ORDER — SODIUM CHLORIDE 0.9 % IV SOLN: INTRAVENOUS | Status: DC

## 2019-12-04 MED ORDER — ACETAMINOPHEN 650 MG RE SUPP: 650.0000 mg | Freq: Four times a day (QID) | RECTAL | Status: DC | PRN

## 2019-12-04 MED ORDER — ONDANSETRON HCL 4 MG PO TABS: 4.0000 mg | ORAL_TABLET | Freq: Four times a day (QID) | ORAL | Status: DC | PRN

## 2019-12-04 MED ORDER — ACETAMINOPHEN 325 MG PO TABS
650.0000 mg | ORAL_TABLET | Freq: Four times a day (QID) | ORAL | Status: DC | PRN
  Administered 2019-12-05: 650 mg via ORAL
  Filled 2019-12-04: qty 2

## 2019-12-04 MED ORDER — ONDANSETRON HCL 4 MG/2ML IJ SOLN: 4.0000 mg | Freq: Four times a day (QID) | INTRAMUSCULAR | Status: DC | PRN

## 2019-12-04 MED ORDER — SODIUM CHLORIDE 0.9% FLUSH
3.0000 mL | Freq: Two times a day (BID) | INTRAVENOUS | Status: DC
  Administered 2019-12-04 – 2019-12-06 (×3): 3 mL via INTRAVENOUS

## 2019-12-04 MED ORDER — AMLODIPINE BESYLATE 5 MG PO TABS
2.5000 mg | ORAL_TABLET | Freq: Every day | ORAL | Status: DC
  Administered 2019-12-04 – 2019-12-06 (×3): 2.5 mg via ORAL
  Filled 2019-12-04 (×3): qty 1

## 2019-12-04 MED ORDER — ENOXAPARIN SODIUM 30 MG/0.3ML ~~LOC~~ SOLN
30.0000 mg | SUBCUTANEOUS | Status: DC
  Administered 2019-12-04 – 2019-12-05 (×2): 30 mg via SUBCUTANEOUS
  Filled 2019-12-04 (×2): qty 0.3

## 2019-12-04 NOTE — Progress Notes (Signed)
*  PRELIMINARY RESULTS* Echocardiogram 2D Echocardiogram has been performed.  Kristy Hardy 12/04/2019, 9:20 AM

## 2019-12-04 NOTE — H&P (Addendum)
History and Physical    Kristy Hardy RAQ:[id number] DOB: 01-26-1925 DOA: 12/04/2019  PCP: [name], MD   Patient coming from: Home  I have personally briefly reviewed patient's old medical records in [place]  Chief Complaint: Syncope  HPI: Kristy Hardy is a 86 y.o. female with medical history significant for seizure disorder with last seizure 2 years prior, hypertension,CKD IIIb, chronic vertigo on meclizine, COPD, mild aortic stenosis followed by Dr. Ubaldo Glassing, history of breast cancer on letrozole, who was in her usual state of health, until the night of admission when she had an episode where while standing beside her, she appeared to lose consciousness and was helped to the ground by family.  She was then helped up and taken inside when she proceeded to vomit 4 times and started complaining of back pain.  She was given a dose of meclizine and rested for a few hours but then appeared to be not acting her usual self and family brought her to the emergency room.  Patient states she felt like the room spinning but denies one-sided numbness weakness or tingling.  Denies headache or visual disturbance.  Denies recent illness with no fever chills cough abdominal pain, or change in bowel habits ED Course: Patient was feeling back to her usual self by arrival in the emergency room and without dizziness, denying back pain and abdominal pain.  Afebrile, pulse 55, BP 132/56, O2 sat 94% on room air.  Creatinine 1.48 , baseline of 1.34, WBC 11.9, troponin 13.  Head CT negative.  Chest x-ray benign.  Urinalysis pending.  Hospitalist consulted for admission.  Review of Systems: As per HPI otherwise all other systems on review of systems negative.    Past Medical History:  Diagnosis Date  . Arthritis   . Atherosclerosis 2019   Dr Ubaldo Glassing  . Breast cancer ([place])   . Cancer ([place]) 11-11-91   left breast   . Cancer ([place]) 08-12-14   right breast, ER/PR positive, Her2 negative  .  COPD (chronic obstructive pulmonary disease) ([place])   . Heart valve problem    leaking  . History of colonoscopy 2012  . History of mammogram 2016  . Personal history of radiation therapy 2016   Rt breast  . Personal history of radiation therapy 1993   Left breast  . Pneumonia July 2014    Past Surgical History:  Procedure Laterality Date  . BREAST BIOPSY Bilateral [id number]   Positive  . BREAST LUMPECTOMY Left 1993  . BREAST LUMPECTOMY Right 2016  . BREAST SURGERY Left 11-11-91   lumpectomy with radiation, Tamoxifen for 2 years.Dr. Sharlet Salina  . BREAST SURGERY Right 09/09/14   RIGHT BREAST WIDE EXCISION   . CHOLECYSTECTOMY  02/23/1972  . COLONOSCOPY    . COLONOSCOPY WITH PROPOFOL N/A 08/27/2017   Procedure: COLONOSCOPY WITH PROPOFOL;  Surgeon: Toledo, Benay Pike, MD;  Location: [facility] ENDOSCOPY;  Service: Gastroenterology;  Laterality: N/A;  . ESOPHAGOGASTRODUODENOSCOPY (EGD) WITH PROPOFOL N/A 08/26/2017   Procedure: ESOPHAGOGASTRODUODENOSCOPY (EGD) WITH PROPOFOL;  Surgeon: Toledo, Benay Pike, MD;  Location: [facility] ENDOSCOPY;  Service: Gastroenterology;  Laterality: N/A;  . KYPHOPLASTY N/A 08/17/2017   Procedure: [id number];  Surgeon: Hessie Knows, MD;  Location: [facility] ORS;  Service: Orthopedics;  Laterality: N/A;     reports that she has never smoked. She has never used smokeless tobacco. She reports that she does not drink alcohol and does not use drugs.  Allergies  Allergen Reactions  . Amoxicillin-Pot Clavulanate Nausea And Vomiting and  Other (See Comments)    Has patient had a PCN reaction causing immediate rash, facial/tongue/throat swelling, SOB or lightheadedness with hypotension: No Has patient had a PCN reaction causing severe rash involving mucus membranes or skin necrosis: No Has patient had a PCN reaction that required hospitalization: No Has patient had a PCN reaction occurring within the last 10 years: Unknown If all of the above answers are "NO", then may proceed with  Cephalosporin use.   . Other Nausea And Vomiting  . Sulfa Antibiotics Rash    Family History  Problem Relation Age of Onset  . Colon cancer Mother 33  . Colon cancer Brother 64  . Breast cancer Sister 68      Prior to Admission medications   Medication Sig Start Date End Date Taking? Authorizing Provider  acetaminophen (TYLENOL) 650 MG CR tablet Take 1 tablet by mouth daily.    [provider]  amiodarone (PACERONE) 200 MG tablet Take 1 tablet (200 mg total) by mouth 2 (two) times daily. 08/28/17   Gladstone Lighter, MD  amLODipine (NORVASC) 5 MG tablet Take 1 tablet by mouth daily. 10/30/17   [provider]  Iron, Ferrous Gluconate, 256 (28 Fe) MG TABS Take by mouth daily.    [provider]  letrozole (FEMARA) 2.5 MG tablet Take 1 tablet (2.5 mg total) by mouth daily. 05/27/19   Cammie Sickle, MD  levETIRAcetam (KEPPRA) 1000 MG tablet Take 1 tablet by mouth daily. 10/26/17   [provider]  Magnesium 250 MG TABS Take 1 tablet by mouth daily.     [provider]  meclizine (ANTIVERT) 25 MG tablet Take 25 mg by mouth 2 (two) times daily as needed for dizziness.    [provider]  metoprolol succinate (TOPROL-XL) 25 MG 24 hr tablet  11/07/18   [provider]  metoprolol tartrate (LOPRESSOR) 25 MG tablet Take 25 mg by mouth daily.    [provider]  omeprazole (PRILOSEC) 40 MG capsule Take 40 mg by mouth daily.    [provider]  pantoprazole (PROTONIX) 40 MG tablet Take 1 tablet (40 mg total) by mouth daily. Patient not taking: Reported on 11/26/2018 08/28/17   Gladstone Lighter, MD  [facility] HFA 108 [phone] Base) MCG/ACT inhaler Inhale 1 puff into the lungs every 6 (six) hours as needed for wheezing or shortness of breath.  05/31/16   [provider]  simvastatin (ZOCOR) 20 MG tablet Take 1 tablet by mouth daily.    [provider]  traMADol (ULTRAM) 50 MG tablet Take 1 tablet (50 mg total)  by mouth 2 (two) times daily. 08/28/17   Gladstone Lighter, MD  vitamin B-12 (CYANOCOBALAMIN) 250 MCG tablet Take 250 mcg by mouth daily.    [provider]    Physical Exam: Vitals:   12/03/19 2148 12/03/19 2155 12/04/19 0237 12/04/19 0239  BP: (!) 132/56  (!) 149/60   Pulse: (!) 55  69 68  Resp: 16  (!) 27 (!) 23  Temp: 98.5 F (36.9 C)     TempSrc: Oral     SpO2: 94%   90%  Weight:  59 kg    Height:  '5\' 5"'  (1.651 m)       Vitals:   12/03/19 2148 12/03/19 2155 12/04/19 0237 12/04/19 0239  BP: (!) 132/56  (!) 149/60   Pulse: (!) 55  69 68  Resp: 16  (!) 27 (!) 23  Temp: 98.5 F (36.9 C)  TempSrc: Oral     SpO2: 94%   90%  Weight:  59 kg    Height:  '5\' 5"'  (1.651 m)        Constitutional: Alert and oriented x 3 . Not in any apparent distress HEENT:      Head: Normocephalic and atraumatic.         Eyes: PERLA, EOMI, Conjunctivae are normal. Sclera is non-icteric.       Mouth/Throat: Mucous membranes are moist.       Neck: Supple with no signs of meningismus. Cardiovascular: Regular rate and rhythm. No murmurs, gallops, or rubs. 2+ symmetrical distal pulses are present . No JVD. No LE edema Respiratory: Respiratory effort normal .Lungs sounds clear bilaterally. No wheezes, crackles, or rhonchi.  Gastrointestinal: Soft, non tender, and non distended with positive bowel sounds. No rebound or guarding. Genitourinary: No CVA tenderness. Musculoskeletal: Nontender with normal range of motion in all extremities. No cyanosis, or erythema of extremities. Neurologic: Normal speech and language. Face is symmetric. Moving all extremities. No gross focal neurologic deficits. Skin: Skin is warm, dry.  No rash or ulcers Psychiatric: Mood and affect are normal Speech and behavior are normal   Labs on Admission: I have personally reviewed following labs and imaging studies  CBC: Recent Labs  Lab 12/03/19 2204  WBC 11.9*  HGB 12.8  HCT 35.9*  MCV 94.2  PLT 423    Basic Metabolic Panel: Recent Labs  Lab 12/03/19 2204  NA 142  K 3.9  CL 101  CO2 31  GLUCOSE 130*  BUN 25*  CREATININE 1.48*  CALCIUM 9.1   GFR: Estimated Creatinine Clearance: 21.4 mL/min (A) (by C-G formula based on SCr of 1.48 mg/dL (H)). Liver Function Tests: No results for input(s): AST, ALT, ALKPHOS, BILITOT, PROT, ALBUMIN in the last 168 hours. No results for input(s): LIPASE, AMYLASE in the last 168 hours. No results for input(s): AMMONIA in the last 168 hours. Coagulation Profile: No results for input(s): INR, PROTIME in the last 168 hours. Cardiac Enzymes: No results for input(s): CKTOTAL, CKMB, CKMBINDEX, TROPONINI in the last 168 hours. BNP (last 3 results) No results for input(s): PROBNP in the last 8760 hours. HbA1C: No results for input(s): HGBA1C in the last 72 hours. CBG: No results for input(s): GLUCAP in the last 168 hours. Lipid Profile: No results for input(s): CHOL, HDL, LDLCALC, TRIG, CHOLHDL, LDLDIRECT in the last 72 hours. Thyroid Function Tests: No results for input(s): TSH, T4TOTAL, FREET4, T3FREE, THYROIDAB in the last 72 hours. Anemia Panel: No results for input(s): VITAMINB12, FOLATE, FERRITIN, TIBC, IRON, RETICCTPCT in the last 72 hours. Urine analysis:    Component Value Date/Time   COLORURINE YELLOW (A) 08/25/2017 1357   APPEARANCEUR HAZY (A) 08/25/2017 1357   APPEARANCEUR Hazy 11/12/2012 2042   LABSPEC 1.019 08/25/2017 1357   LABSPEC 1.026 11/12/2012 2042   PHURINE 5.0 08/25/2017 1357   GLUCOSEU NEGATIVE 08/25/2017 1357   GLUCOSEU Negative 11/12/2012 2042   HGBUR SMALL (A) 08/25/2017 1357   BILIRUBINUR NEGATIVE 08/25/2017 1357   BILIRUBINUR Negative 11/12/2012 2042   KETONESUR NEGATIVE 08/25/2017 1357   PROTEINUR NEGATIVE 08/25/2017 1357   NITRITE NEGATIVE 08/25/2017 1357   LEUKOCYTESUR SMALL (A) 08/25/2017 1357   LEUKOCYTESUR 3+ 11/12/2012 2042    Radiological Exams on Admission: DG Chest 1 View  Result Date:  12/03/2019 CLINICAL DATA:  Dizziness EXAM: CHEST  1 VIEW COMPARISON:  11/14/2012 FINDINGS: Two frontal views of the chest demonstrate a stable cardiac silhouette. There is marked  calcification of the mitral annulus. No airspace disease, effusion, or pneumothorax. Prior lower thoracic compression deformities with evidence of vertebral augmentation. No acute bony abnormalities. IMPRESSION: 1. No acute intrathoracic process. Electronically Signed   By: Randa Ngo M.D.   On: 12/03/2019 23:40   CT Head Wo Contrast  Result Date: 12/03/2019 CLINICAL DATA:  Vertigo EXAM: CT HEAD WITHOUT CONTRAST TECHNIQUE: Contiguous axial images were obtained from the base of the skull through the vertex without intravenous contrast. COMPARISON:  01/17/2017 FINDINGS: Brain: No acute infarct or hemorrhage. Lateral ventricles and midline structures are unremarkable. No acute extra-axial fluid collections. No mass effect. Vascular: No hyperdense vessel or unexpected calcification. Skull: Normal. Negative for fracture or focal lesion. Sinuses/Orbits: No acute finding. Other: None. IMPRESSION: 1. No acute intracranial process. Electronically Signed   By: Randa Ngo M.D.   On: 12/03/2019 23:51    EKG: Independently reviewed. Interpretation :   Assessment/Plan 86 year old female with seizure disorder with last seizure 2 years prior, hypertension,CKD IIIb, chronic vertigo on meclizine, COPD, mild aortic stenosis followed by Dr. Ubaldo Glassing, history of breast cancer on letrozole, who was in her usual state of health, until the night of admission when she had a syncopal episode followed by 4 episodes of nonbloody nonbilious vomiting    Syncope and collapse -Uncertain etiology.  Differential includes dehydration, TIA, possible UTI.  Low suspicion for seizure -Head CT with no acute intracranial findings -Follow-up UA -Syncope/stroke work-up with cardiac monitoring, echocardiogram, carotid Doppler, MRI,EEG -Patient arrived several  hours following onset of symptoms and balance outside of TPA window  Aortic valve stenosis- mild -Followed by Dr. Ubaldo Glassing    Nausea and vomiting -Possibly related to vertigo.  No other focalizing signs -MRI to evaluate for central vertigo -Follow-up urine analysis  AKI superimposed on CKD 3B -Creatinine slightly elevated at 1.48 above baseline of around 1.2 -IV hydration and monitor  Acute on Chronic back pain with history of kyphoplasty -Takes tramadol.  Follows with Dr. Rudene Christians -Follow thoracic and lumbar spine x-rays    Seizure disorder ([place]) -Continue Keppra  COPD with chronic bronchitis ([place]) -DuoNebs as needed    DVT prophylaxis: Lovenox  Code Status: full code  Family Communication:  none  Disposition Plan: Back to previous home environment Consults called: none  Status: Observation    Athena Masse MD [facility]     12/04/2019, 3:48 AM

## 2019-12-04 NOTE — Evaluation (Signed)
Physical Therapy Evaluation Patient Details Name: Kristy Hardy MRN: [id number] DOB: 1924/07/05 Today's Date: 12/04/2019   History of Present Illness  Pt is a 86 y.o. female  with medical history significant for seizure disorder with last seizure 2 years prior on Keppra, hypertension,CKD, breast cancer, chronic vertigo on meclizine, COPD who was in her usual state of health, until last night of when she had an episode where while standing had episode of vertigo and fell.  No reports of LOC. Son witnessed the event. Similar episode few days prior.    Clinical Impression  Patient in bed, easily woken. A&Ox4, reported back pain that started last week, mod pain signs/symptoms with mobility, but pt did report decreased pain with sit to stand repetitions. Pt stated at baseline she lives alone, has an aide to assist with housekeeping, son assists with lawncare, but mod I/I for ADLs. Had a similar dizziness episode a few weeks ago.  The patient performed supine to sit with supervision and use of bed rails. Good sitting balance noted. Sit <> stand with RW and without AD, pt needed CGA initially but progressed to supervision, able to perform 3-4 times during session. Pt with incontinent episode, PT assisted with cleanup but then pt able to ambulate to door and back. With RW CGA, but pt had difficulty with RW management, improved gait velocity and safety noted with handheld assist instead.  Overall the patient demonstrated deficits (see "PT Problem List") that impede the patient's functional abilities, safety, and mobility and would benefit from skilled PT intervention.  Recommendation is HHPT with intermittent supervision.  Pt assessed for orthostatic vitals, see flowsheets for details but positive. MD and RN notified, pt slightly dizzy but able to ambulate.     Follow Up Recommendations Home health PT;Supervision - Intermittent    Equipment Recommendations  Cane    Recommendations for Other  Services       Precautions / Restrictions Precautions Precautions: Fall Restrictions Weight Bearing Restrictions: No      Mobility  Bed Mobility Overal bed mobility: Needs Assistance Bed Mobility: Supine to Sit     Supine to sit: Supervision;HOB elevated        Transfers Overall transfer level: Needs assistance Equipment used: Rolling walker (2 wheeled);None Transfers: Sit to/from Stand Sit to Stand: Min guard;Supervision         General transfer comment: progressed from CGA to supervision with adequate UE support and decreased low back pain  Ambulation/Gait Ambulation/Gait assistance: Min guard Gait Distance (Feet): 30 Feet Assistive device: Rolling walker (2 wheeled);1 person hand held assist   Gait velocity: decreased   General Gait Details: Pt able to ambulate with RW, but also able to ambulate without RW with handheld assist with improved safety.  Stairs            Wheelchair Mobility    Modified Rankin (Stroke Patients Only)       Balance Overall balance assessment: Needs assistance Sitting-balance support: Feet supported Sitting balance-Leahy Scale: Good       Standing balance-Leahy Scale: Good                               Pertinent Vitals/Pain Pain Assessment: Faces Faces Pain Scale: Hurts even more Pain Location: low back pain, but pt reported with repetition and being up on her feet her pain decreased Pain Descriptors / Indicators: Aching Pain Intervention(s): Limited activity within patient's tolerance;Monitored during session;Repositioned  Home Living Family/patient expects to be discharged to:: Private residence Living Arrangements: Alone Available Help at Discharge: Family;Available PRN/intermittently Type of Home: House Home Access: Stairs to enter Entrance Stairs-Rails: Psychiatric nurse of Steps: 2 Home Layout: One level Home Equipment: Walker - 2 wheels;Cane - single point      Prior  Function Level of Independence: Independent with assistive device(s)         Comments: Pt stated she is independent in ADLs at baseline, has someone assist with cleaning home, son does lawncare.     Hand Dominance        Extremity/Trunk Assessment   Upper Extremity Assessment Upper Extremity Assessment: Overall WFL for tasks assessed    Lower Extremity Assessment Lower Extremity Assessment: Generalized weakness    Cervical / Trunk Assessment Cervical / Trunk Assessment: Normal  Communication   Communication: No difficulties  Cognition Arousal/Alertness: Awake/alert Behavior During Therapy: WFL for tasks assessed/performed Overall Cognitive Status: Within Functional Limits for tasks assessed                                        General Comments      Exercises     Assessment/Plan    PT Assessment Patient needs continued PT services  PT Problem List Decreased strength;Decreased activity tolerance;Pain;Decreased knowledge of use of DME;Decreased balance       PT Treatment Interventions DME instruction;Therapeutic exercise;Gait training;Balance training;Stair training;Neuromuscular re-education;Functional mobility training;Therapeutic activities;Patient/family education    PT Goals (Current goals can be found in the Care Plan section)  Acute Rehab PT Goals Patient Stated Goal: to decrease low back pain PT Goal Formulation: With patient Time For Goal Achievement: 12/18/19 Potential to Achieve Goals: Good    Frequency Min 2X/week   Barriers to discharge Decreased caregiver support      Co-evaluation               AM-PAC PT "6 Clicks" Mobility  Outcome Measure Help needed turning from your back to your side while in a flat bed without using bedrails?: A Little Help needed moving from lying on your back to sitting on the side of a flat bed without using bedrails?: A Little Help needed moving to and from a bed to a chair (including a  wheelchair)?: A Little Help needed standing up from a chair using your arms (e.g., wheelchair or bedside chair)?: A Little Help needed to walk in hospital room?: A Little Help needed climbing 3-5 steps with a railing? : A Little 6 Click Score: 18    End of Session Equipment Utilized During Treatment: Gait belt Activity Tolerance: Patient tolerated treatment well Patient left: in chair;with chair alarm set;with call bell/phone within reach Nurse Communication: Mobility status PT Visit Diagnosis: Other abnormalities of gait and mobility (R26.89);Muscle weakness (generalized) (M62.81);Difficulty in walking, not elsewhere classified (R26.2);Pain Pain - Right/Left:  (midline) Pain - part of body:  (low back)    Time: 1610-9604 PT Time Calculation (min) (ACUTE ONLY): 40 min   Charges:   PT Evaluation $PT Eval Low Complexity: 1 Low PT Treatments $Therapeutic Exercise: 23-37 mins       Lieutenant Diego PT, DPT 3:43 PM,12/04/19

## 2019-12-04 NOTE — Consult Note (Signed)
Reason for Consult: syncopal event Requesting Physician: Dr. Onalee Hua   CC: syncopal event   HPI: Kristy Hardy is an 86 y.o. female  with medical history significant for seizure disorder with last seizure 2 years prior on Keppra, hypertension,CKD, chronic vertigo on meclizine, COPD who was in her usual state of health, until last night of when she had an episode where while standing had episode of vertigo and fell.  No reports of LOC. Son witnessed the event. Similar episode few days prior.  No clear seizure activity, tongue biting or urinary incontinence. Now appears back to baseline.   Past Medical History:  Diagnosis Date  . Arthritis   . Atherosclerosis 2019   Dr Ubaldo Glassing  . Breast cancer ([place])   . Cancer ([place]) 11-11-91   left breast   . Cancer ([place]) 08-12-14   right breast, ER/PR positive, Her2 negative  . COPD (chronic obstructive pulmonary disease) ([place])   . Heart valve problem    leaking  . History of colonoscopy 2012  . History of mammogram 2016  . Personal history of radiation therapy 2016   Rt breast  . Personal history of radiation therapy 1993   Left breast  . Pneumonia July 2014    Past Surgical History:  Procedure Laterality Date  . BREAST BIOPSY Bilateral [id number]   Positive  . BREAST LUMPECTOMY Left 1993  . BREAST LUMPECTOMY Right 2016  . BREAST SURGERY Left 11-11-91   lumpectomy with radiation, Tamoxifen for 2 years.Dr. Sharlet Salina  . BREAST SURGERY Right 09/09/14   RIGHT BREAST WIDE EXCISION   . CHOLECYSTECTOMY  02/23/1972  . COLONOSCOPY    . COLONOSCOPY WITH PROPOFOL N/A 08/27/2017   Procedure: COLONOSCOPY WITH PROPOFOL;  Surgeon: Toledo, Benay Pike, MD;  Location: [facility] ENDOSCOPY;  Service: Gastroenterology;  Laterality: N/A;  . ESOPHAGOGASTRODUODENOSCOPY (EGD) WITH PROPOFOL N/A 08/26/2017   Procedure: ESOPHAGOGASTRODUODENOSCOPY (EGD) WITH PROPOFOL;  Surgeon: Toledo, Benay Pike, MD;  Location: [facility] ENDOSCOPY;  Service: Gastroenterology;  Laterality: N/A;  .  KYPHOPLASTY N/A 08/17/2017   Procedure: [id number];  Surgeon: Hessie Knows, MD;  Location: [facility] ORS;  Service: Orthopedics;  Laterality: N/A;    Family History  Problem Relation Age of Onset  . Colon cancer Mother 87  . Colon cancer Brother 13  . Breast cancer Sister 61    Social History:  reports that she has never smoked. She has never used smokeless tobacco. She reports that she does not drink alcohol and does not use drugs.  Allergies  Allergen Reactions  . Amoxicillin-Pot Clavulanate Nausea And Vomiting and Other (See Comments)    Has patient had a PCN reaction causing immediate rash, facial/tongue/throat swelling, SOB or lightheadedness with hypotension: No Has patient had a PCN reaction causing severe rash involving mucus membranes or skin necrosis: No Has patient had a PCN reaction that required hospitalization: No Has patient had a PCN reaction occurring within the last 10 years: Unknown If all of the above answers are "NO", then may proceed with Cephalosporin use.   . Other Nausea And Vomiting  . Sulfa Antibiotics Rash    Medications: I have reviewed the patient's current medications.  ROS: History obtained from the patient  General ROS: negative for - chills, fatigue, fever, night sweats, weight gain or weight loss Psychological ROS: negative for - behavioral disorder, hallucinations, memory difficulties, mood swings or suicidal ideation Ophthalmic ROS: negative for - blurry vision, double vision, eye pain or loss of vision ENT ROS: negative for - epistaxis, nasal discharge, oral lesions,  sore throat, tinnitus or vertigo Allergy and Immunology ROS: negative for - hives or itchy/watery eyes Hematological and Lymphatic ROS: negative for - bleeding problems, bruising or swollen lymph nodes Endocrine ROS: negative for - galactorrhea, hair pattern changes, polydipsia/polyuria or temperature intolerance Respiratory ROS: negative for - cough, hemoptysis, shortness of  breath or wheezing Cardiovascular ROS: negative for - chest pain, dyspnea on exertion, edema or irregular heartbeat Gastrointestinal ROS: negative for - abdominal pain, diarrhea, hematemesis, nausea/vomiting or stool incontinence Genito-Urinary ROS: negative for - dysuria, hematuria, incontinence or urinary frequency/urgency Musculoskeletal ROS: negative for - joint swelling or muscular weakness Neurological ROS: as noted in HPI Dermatological ROS: negative for rash and skin lesion changes  Physical Examination: Blood pressure (!) 252/47, pulse (!) 57, temperature 98.5 F (36.9 C), temperature source Oral, resp. rate 18, height '5\' 5"'  (1.651 m), weight 59 kg, SpO2 94 %.    Neurological Examination   Mental Status: Alert, oriented, thought content appropriate.  Speech fluent without evidence of aphasia.  Able to follow 3 step commands without difficulty. Cranial Nerves: II: Discs flat bilaterally; Visual fields grossly normal, pupils equal, round, reactive to light and accommodation III,IV, VI: ptosis not present, extra-ocular motions intact bilaterally V,VII: smile symmetric, facial light touch sensation normal bilaterally VIII: hearing normal bilaterally IX,X: gag reflex present XI: bilateral shoulder shrug XII: midline tongue extension Motor: Generalized weakness specifically lower extremities.  Sensory: Pinprick and light touch intact throughout, bilaterally Deep Tendon Reflexes: 1+ and symmetric throughout Plantars: Right: downgoing   Left: downgoing Cerebellar: normal finger-to-nose      Laboratory Studies:   Basic Metabolic Panel: Recent Labs  Lab 12/03/19 2204  NA 142  K 3.9  CL 101  CO2 31  GLUCOSE 130*  BUN 25*  CREATININE 1.48*  CALCIUM 9.1    Liver Function Tests: No results for input(s): AST, ALT, ALKPHOS, BILITOT, PROT, ALBUMIN in the last 168 hours. No results for input(s): LIPASE, AMYLASE in the last 168 hours. No results for input(s): AMMONIA in  the last 168 hours.  CBC: Recent Labs  Lab 12/03/19 2204  WBC 11.9*  HGB 12.8  HCT 35.9*  MCV 94.2  PLT 210    Cardiac Enzymes: No results for input(s): CKTOTAL, CKMB, CKMBINDEX, TROPONINI in the last 168 hours.  BNP: Invalid input(s): POCBNP  CBG: Recent Labs  Lab 12/04/19 0859  GLUCAP 100*    Microbiology: Results for orders placed or performed during the hospital encounter of 12/04/19  SARS Coronavirus 2 by RT PCR (hospital order, performed in [facility] hospital lab) Nasopharyngeal Nasopharyngeal Swab     Status: None   Collection Time: 12/04/19  4:59 AM   Specimen: Nasopharyngeal Swab  Result Value Ref Range Status   SARS Coronavirus 2 NEGATIVE NEGATIVE Final    Comment: (NOTE) SARS-CoV-2 target nucleic acids are NOT DETECTED.  The SARS-CoV-2 RNA is generally detectable in upper and lower respiratory specimens during the acute phase of infection. The lowest concentration of SARS-CoV-2 viral copies this assay can detect is 250 copies / mL. A negative result does not preclude SARS-CoV-2 infection and should not be used as the sole basis for treatment or other patient management decisions.  A negative result may occur with improper specimen collection / handling, submission of specimen other than nasopharyngeal swab, presence of viral mutation(s) within the areas targeted by this assay, and inadequate number of viral copies (<250 copies / mL). A negative result must be combined with clinical observations, patient history, and epidemiological information.  Fact Sheet for Patients:   StrictlyIdeas.no  Fact Sheet for Healthcare Providers: BankingDealers.co.za  This test is not yet approved or  cleared by the [place] FDA and has been authorized for detection and/or diagnosis of SARS-CoV-2 by FDA under an Emergency Use Authorization (EUA).  This EUA will remain in effect (meaning this test can be used) for the  duration of the COVID-19 declaration under Section 564(b)(1) of the Act, 21 U.S.C. section 360bbb-3(b)(1), unless the authorization is terminated or revoked sooner.  Performed at [facility], [address] [place], [place] [id number]     Coagulation Studies: No results for input(s): LABPROT, INR in the last 72 hours.  Urinalysis:  Recent Labs  Lab 12/04/19 0521  COLORURINE STRAW*  LABSPEC 1.009  PHURINE 7.0  GLUCOSEU 50*  HGBUR NEGATIVE  BILIRUBINUR NEGATIVE  KETONESUR NEGATIVE  PROTEINUR NEGATIVE  NITRITE NEGATIVE  LEUKOCYTESUR TRACE*    Lipid Panel:  No results found for: CHOL, TRIG, HDL, CHOLHDL, VLDL, LDLCALC  HgbA1C: No results found for: HGBA1C  Urine Drug Screen:  No results found for: LABOPIA, COCAINSCRNUR, LABBENZ, AMPHETMU, THCU, LABBARB  Alcohol Level: No results for input(s): ETH in the last 168 hours.  Other results: EKG: normal EKG, normal sinus rhythm, unchanged from previous tracings.  Imaging: DG Chest 1 View  Result Date: 12/03/2019 CLINICAL DATA:  Dizziness EXAM: CHEST  1 VIEW COMPARISON:  11/14/2012 FINDINGS: Two frontal views of the chest demonstrate a stable cardiac silhouette. There is marked calcification of the mitral annulus. No airspace disease, effusion, or pneumothorax. Prior lower thoracic compression deformities with evidence of vertebral augmentation. No acute bony abnormalities. IMPRESSION: 1. No acute intrathoracic process. Electronically Signed   By: Randa Ngo M.D.   On: 12/03/2019 23:40   DG Thoracic Spine 2 View  Result Date: 12/04/2019 CLINICAL DATA:  Vertigo. Chronic back pain. No history of recent falls. EXAM: THORACIC SPINE 2 VIEWS COMPARISON:  None available and similar FINDINGS: Remote T12 and L1 compression fractures with cement augmentation. There is advanced height loss with kyphotic deformity. No acute fracture is seen at the other levels. No evidence of bone lesion or endplate erosion. Osteopenia.  Atherosclerosis and mitral annular ossification IMPRESSION: 1. No acute finding. 2. Remote T12 and L1 compression fractures with cement augmentation. Electronically Signed   By: Monte Fantasia M.D.   On: 12/04/2019 05:58   DG Lumbar Spine 2-3 Views  Result Date: 12/04/2019 CLINICAL DATA:  Chronic back pain.  No recent falls EXAM: LUMBAR SPINE - 2-3 VIEW COMPARISON:  08/17/2017 FINDINGS: Remote T12 and L1 compression fractures. There has been cement augmentation at both levels. No acute fracture is seen elsewhere. Severe lumbar spine degeneration with diffuse disc collapse, endplate ridging, and multilevel listhesis. There is dextroscoliosis that is moderate. Generalized osteopenia IMPRESSION: 1. No acute finding. 2. Remote T12 and L1 compression fractures. 3. Advanced lumbar spine degeneration with multilevel listhesis and dextroscoliosis. Electronically Signed   By: Monte Fantasia M.D.   On: 12/04/2019 05:59   CT Head Wo Contrast  Result Date: 12/03/2019 CLINICAL DATA:  Vertigo EXAM: CT HEAD WITHOUT CONTRAST TECHNIQUE: Contiguous axial images were obtained from the base of the skull through the vertex without intravenous contrast. COMPARISON:  01/17/2017 FINDINGS: Brain: No acute infarct or hemorrhage. Lateral ventricles and midline structures are unremarkable. No acute extra-axial fluid collections. No mass effect. Vascular: No hyperdense vessel or unexpected calcification. Skull: Normal. Negative for fracture or focal lesion. Sinuses/Orbits: No acute finding. Other: None. IMPRESSION: 1. No  acute intracranial process. Electronically Signed   By: Randa Ngo M.D.   On: 12/03/2019 23:51   MR BRAIN WO CONTRAST  Result Date: 12/04/2019 CLINICAL DATA:  Seizure disorder. EXAM: MRI HEAD WITHOUT CONTRAST TECHNIQUE: Multiplanar, multiecho pulse sequences of the brain and surrounding structures were obtained without intravenous contrast. COMPARISON:  Head CT from yesterday and brain MRI 01/17/2017 FINDINGS:  Significantly motion degraded brain MRI. There is cerebral volume loss and periventricular chronic small vessel ischemia in keeping with age. No acute infarct, hydrocephalus, collection, or masslike finding. Xanthogranulomas of the choroid plexus that are incidental. IMPRESSION: Unremarkable exam for age when allowing for significant motion artifact. Electronically Signed   By: Monte Fantasia M.D.   On: 12/04/2019 07:13   ECHOCARDIOGRAM COMPLETE  Result Date: 12/04/2019    ECHOCARDIOGRAM REPORT   Patient Name:   Kristy Hardy Date of Exam: 12/04/2019 Medical Rec #:  [id number]               Height:       65.0 in Accession #:    [id number]              Weight:       130.0 lb Date of Birth:  [birth year]               BSA:          1.647 m Patient Age:    28 years                BP:           252/47 mmHg Patient Gender: F                       HR:           57 bpm. Exam Location:  [facility] Procedure: 2D Echo, Cardiac Doppler and Color Doppler Indications:     syncope 780.2  History:         Patient has no prior history of Echocardiogram examinations.                  COPD. Breast cancer, heart valve problem.  Sonographer:     Sherrie Sport RDCS (AE) Referring Phys:  [id number] Athena Masse Diagnosing Phys: Kate Sable MD  Sonographer Comments: No apical window. IMPRESSIONS  1. Left ventricular ejection fraction, by estimation, is 65 to 70%. The left ventricle has normal function. The left ventricle has no regional wall motion abnormalities. There is moderate asymmetric left ventricular hypertrophy. Left ventricular diastolic function could not be evaluated.  2. Right ventricular systolic function is normal. The right ventricular size is normal.  3. The mitral valve is grossly normal. Trivial mitral valve regurgitation.  4. Aortic valve hemodynamics not measured in this study. visualization reveals approximately moderate aortic stenosis. The aortic valve was not well visualized. Aortic valve regurgitation is  not visualized. FINDINGS  Left Ventricle: Left ventricular ejection fraction, by estimation, is 65 to 70%. The left ventricle has normal function. The left ventricle has no regional wall motion abnormalities. The left ventricular internal cavity size was small. There is moderate  asymmetric left ventricular hypertrophy. Left ventricular diastolic function could not be evaluated. Right Ventricle: The right ventricular size is normal. No increase in right ventricular wall thickness. Right ventricular systolic function is normal. Left Atrium: Left atrial size was normal in size. Right Atrium: Right atrial size was not well visualized. Pericardium: There is no evidence of pericardial effusion. Mitral  Valve: The mitral valve is grossly normal. Mild mitral annular calcification. Trivial mitral valve regurgitation. Tricuspid Valve: The tricuspid valve is grossly normal. Tricuspid valve regurgitation is mild. Aortic Valve: Aortic valve hemodynamics not measured in this study. visualization reveals approximately moderate aortic stenosis. The aortic valve was not well visualized. Aortic valve regurgitation is not visualized. Pulmonic Valve: The pulmonic valve was not well visualized. Pulmonic valve regurgitation is not visualized. Aorta: The aortic root is normal in size and structure. Venous: The inferior vena cava was not well visualized. IAS/Shunts: No atrial level shunt detected by color flow Doppler.  LEFT VENTRICLE PLAX 2D LVIDd:         3.62 cm LVIDs:         2.05 cm LV PW:         1.31 cm LV IVS:        1.55 cm LVOT diam:     2.00 cm LVOT Area:     3.14 cm  LEFT ATRIUM         Index LA diam:    3.30 cm 2.00 cm/m                        PULMONIC VALVE AORTA                 PV Vmax:        0.80 m/s Ao Root diam: 2.60 cm PV Peak grad:   2.6 mmHg                       RVOT Peak grad: 4 mmHg  TRICUSPID VALVE TR Peak grad:   43.0 mmHg TR Vmax:        328.00 cm/s  SHUNTS Systemic Diam: 2.00 cm Kate Sable MD  Electronically signed by Kate Sable MD Signature Date/Time: 12/04/2019/10:31:52 AM    Final      Assessment/Plan:  86 y.o. female  with medical history significant for seizure disorder with last seizure 2 years prior on Keppra, hypertension,CKD, chronic vertigo on meclizine, COPD who was in her usual state of health, until last night of when she had an episode where while standing had episode of vertigo and fell.  No reports of LOC. Son witnessed the event. Similar episode few days prior.  No clear seizure activity, tongue biting or urinary incontinence. Now appears back to baseline.   - likely syncopal event vs vertigo as she experienced spinning sensation prior. She is on meclizine and had similar episodes prior - MRI no acute abnormalities  - Do not think this is seizure related and don't think there is a need for EEG given the description.  - Continue home Keppra - Pt does live alone and likely needs placement/assistance.  - social work/case management - pt/ot - as above likely positional vertigo/syncope rather then stroke or seizure related symptoms 12/04/2019, 11:18 AM

## 2019-12-04 NOTE — Progress Notes (Signed)
Per Dr Jimmye Norman pt does not need [facility] stroke orders

## 2019-12-04 NOTE — ED Notes (Signed)
Pt reports taking 1000 mg Keppra nightly, has not had dose tonight

## 2019-12-04 NOTE — Progress Notes (Signed)
PROGRESS NOTE    Kristy Hardy  CLE:[id number] DOB: November 23, 1924 DOA: 12/04/2019 PCP: [name], MD   Assessment & Plan:   Principal Problem:   Syncope and collapse Active Problems:   Aortic valve stenosis- mild   Nausea and vomiting   Vertigo   Seizure disorder (HCC)   COPD with chronic bronchitis (HCC)   Acute renal failure superimposed on chronic kidney disease (HCC)   Syncope: etiology unclear, possible dehydration vs orthostatic hypotension. CT brain with no acute intracranial findings. MRI shows no acute intracranial abnormalities noted. Echocardiogram, EEG ordered. Continue on tele   Aortic valve stenosis: mild. Followed by Dr. Ubaldo Glassing  Nausea and vomiting: Possibly related to vertigo.  No other focalizing signs.   AKI on CKDIIIb: Cr slightly elevated at 1.48 above baseline of around 1.2. Continue on IVFs  Acute on chronic back pain: with history of kyphoplasty. Continue on tramadol. Follows with Dr. Rudene Christians. Back XRs shows remote T12, L1 compression fractures w/ advanced lumbar spine degeneration   Seizure disorder: unknown type. Continue keppra  COPD with chronic bronchitis: w/o exacerbation. DuoNebs prn   DVT prophylaxis: lovenox  Code Status: full Family Communication: called pt's daughter, Janera, but no answer so I left a message  Disposition Plan: depends on PT/OT recs   Consultants:      Procedures:   Antimicrobials:   Subjective: Pt c/o right side pain   Objective: Vitals:   12/04/19 0237 12/04/19 0239 12/04/19 0645 12/04/19 0844  BP: (!) 149/60   (!) 252/47  Pulse: 69 68  (!) 57  Resp: (!) 27 (!) 23  18  Temp:      TempSrc:      SpO2:  90% 91% 94%  Weight:      Height:       No intake or output data in the 24 hours ending 12/04/19 0907 Filed Weights   12/03/19 2155  Weight: 59 kg    Examination:  General exam: Appears calm and comfortable  Respiratory system: Clear to auscultation. Respiratory effort  normal. Cardiovascular system: S1 & S2 +. No rubs, gallops or clicks. Gastrointestinal system: Abdomen is nondistended, soft and nontender. Normal bowel sounds heard. Central nervous system: Alert and oriented. Moves all 4 extremities  Psychiatry: Judgement and insight appear normal. Mood & affect appropriate.     Data Reviewed: I have personally reviewed following labs and imaging studies  CBC: Recent Labs  Lab 12/03/19 2204  WBC 11.9*  HGB 12.8  HCT 35.9*  MCV 94.2  PLT 944   Basic Metabolic Panel: Recent Labs  Lab 12/03/19 2204  NA 142  K 3.9  CL 101  CO2 31  GLUCOSE 130*  BUN 25*  CREATININE 1.48*  CALCIUM 9.1   GFR: Estimated Creatinine Clearance: 21.4 mL/min (A) (by C-G formula based on SCr of 1.48 mg/dL (H)). Liver Function Tests: No results for input(s): AST, ALT, ALKPHOS, BILITOT, PROT, ALBUMIN in the last 168 hours. No results for input(s): LIPASE, AMYLASE in the last 168 hours. No results for input(s): AMMONIA in the last 168 hours. Coagulation Profile: No results for input(s): INR, PROTIME in the last 168 hours. Cardiac Enzymes: No results for input(s): CKTOTAL, CKMB, CKMBINDEX, TROPONINI in the last 168 hours. BNP (last 3 results) No results for input(s): PROBNP in the last 8760 hours. HbA1C: No results for input(s): HGBA1C in the last 72 hours. CBG: Recent Labs  Lab 12/04/19 0859  GLUCAP 100*   Lipid Profile: No results for input(s): CHOL, HDL, LDLCALC,  TRIG, CHOLHDL, LDLDIRECT in the last 72 hours. Thyroid Function Tests: No results for input(s): TSH, T4TOTAL, FREET4, T3FREE, THYROIDAB in the last 72 hours. Anemia Panel: No results for input(s): VITAMINB12, FOLATE, FERRITIN, TIBC, IRON, RETICCTPCT in the last 72 hours. Sepsis Labs: No results for input(s): PROCALCITON, LATICACIDVEN in the last 168 hours.  Recent Results (from the past 240 hour(s))  SARS Coronavirus 2 by RT PCR (hospital order, performed in [facility] hospital lab)  Nasopharyngeal Nasopharyngeal Swab     Status: None   Collection Time: 12/04/19  4:59 AM   Specimen: Nasopharyngeal Swab  Result Value Ref Range Status   SARS Coronavirus 2 NEGATIVE NEGATIVE Final    Comment: (NOTE) SARS-CoV-2 target nucleic acids are NOT DETECTED.  The SARS-CoV-2 RNA is generally detectable in upper and lower respiratory specimens during the acute phase of infection. The lowest concentration of SARS-CoV-2 viral copies this assay can detect is 250 copies / mL. A negative result does not preclude SARS-CoV-2 infection and should not be used as the sole basis for treatment or other patient management decisions.  A negative result may occur with improper specimen collection / handling, submission of specimen other than nasopharyngeal swab, presence of viral mutation(s) within the areas targeted by this assay, and inadequate number of viral copies (<250 copies / mL). A negative result must be combined with clinical observations, patient history, and epidemiological information.  Fact Sheet for Patients:   StrictlyIdeas.no  Fact Sheet for Healthcare Providers: BankingDealers.co.za  This test is not yet approved or  cleared by the [place] FDA and has been authorized for detection and/or diagnosis of SARS-CoV-2 by FDA under an Emergency Use Authorization (EUA).  This EUA will remain in effect (meaning this test can be used) for the duration of the COVID-19 declaration under Section 564(b)(1) of the Act, 21 U.S.C. section 360bbb-3(b)(1), unless the authorization is terminated or revoked sooner.  Performed at [facility], [address] [place], [place] [address]          Radiology Studies: DG Chest 1 View  Result Date: 12/03/2019 CLINICAL DATA:  Dizziness EXAM: CHEST  1 VIEW COMPARISON:  11/14/2012 FINDINGS: Two frontal views of the chest demonstrate a stable cardiac silhouette. There is marked  calcification of the mitral annulus. No airspace disease, effusion, or pneumothorax. Prior lower thoracic compression deformities with evidence of vertebral augmentation. No acute bony abnormalities. IMPRESSION: 1. No acute intrathoracic process. Electronically Signed   By: Randa Ngo M.D.   On: 12/03/2019 23:40   DG Thoracic Spine 2 View  Result Date: 12/04/2019 CLINICAL DATA:  Vertigo. Chronic back pain. No history of recent falls. EXAM: THORACIC SPINE 2 VIEWS COMPARISON:  None available and similar FINDINGS: Remote T12 and L1 compression fractures with cement augmentation. There is advanced height loss with kyphotic deformity. No acute fracture is seen at the other levels. No evidence of bone lesion or endplate erosion. Osteopenia. Atherosclerosis and mitral annular ossification IMPRESSION: 1. No acute finding. 2. Remote T12 and L1 compression fractures with cement augmentation. Electronically Signed   By: Monte Fantasia M.D.   On: 12/04/2019 05:58   DG Lumbar Spine 2-3 Views  Result Date: 12/04/2019 CLINICAL DATA:  Chronic back pain.  No recent falls EXAM: LUMBAR SPINE - 2-3 VIEW COMPARISON:  08/17/2017 FINDINGS: Remote T12 and L1 compression fractures. There has been cement augmentation at both levels. No acute fracture is seen elsewhere. Severe lumbar spine degeneration with diffuse disc collapse, endplate ridging, and multilevel listhesis. There  is dextroscoliosis that is moderate. Generalized osteopenia IMPRESSION: 1. No acute finding. 2. Remote T12 and L1 compression fractures. 3. Advanced lumbar spine degeneration with multilevel listhesis and dextroscoliosis. Electronically Signed   By: Monte Fantasia M.D.   On: 12/04/2019 05:59   CT Head Wo Contrast  Result Date: 12/03/2019 CLINICAL DATA:  Vertigo EXAM: CT HEAD WITHOUT CONTRAST TECHNIQUE: Contiguous axial images were obtained from the base of the skull through the vertex without intravenous contrast. COMPARISON:  01/17/2017 FINDINGS:  Brain: No acute infarct or hemorrhage. Lateral ventricles and midline structures are unremarkable. No acute extra-axial fluid collections. No mass effect. Vascular: No hyperdense vessel or unexpected calcification. Skull: Normal. Negative for fracture or focal lesion. Sinuses/Orbits: No acute finding. Other: None. IMPRESSION: 1. No acute intracranial process. Electronically Signed   By: Randa Ngo M.D.   On: 12/03/2019 23:51   MR BRAIN WO CONTRAST  Result Date: 12/04/2019 CLINICAL DATA:  Seizure disorder. EXAM: MRI HEAD WITHOUT CONTRAST TECHNIQUE: Multiplanar, multiecho pulse sequences of the brain and surrounding structures were obtained without intravenous contrast. COMPARISON:  Head CT from yesterday and brain MRI 01/17/2017 FINDINGS: Significantly motion degraded brain MRI. There is cerebral volume loss and periventricular chronic small vessel ischemia in keeping with age. No acute infarct, hydrocephalus, collection, or masslike finding. Xanthogranulomas of the choroid plexus that are incidental. IMPRESSION: Unremarkable exam for age when allowing for significant motion artifact. Electronically Signed   By: Monte Fantasia M.D.   On: 12/04/2019 07:13        Scheduled Meds: . enoxaparin (LOVENOX) injection  30 mg Subcutaneous Q24H  . sodium chloride flush  3 mL Intravenous Q12H   Continuous Infusions: . sodium chloride 75 mL/hr at 12/04/19 0501     LOS: 0 days    Time spent: 35 mins    Wyvonnia Dusky, MD [facility] Pager [phone]  If 7PM-7AM, please contact night-coverage [email or web address] 12/04/2019, 9:07 AM

## 2019-12-04 NOTE — ED Provider Notes (Signed)
[facility] Emergency Department Provider Note  ____________________________________________   First MD Initiated Contact with Patient 12/04/19 [phone]     (approximate)  I have reviewed the triage vital signs and the nursing notes.   HISTORY  Chief Complaint Dizziness and Back Pain    HPI Kristy Hardy is a 86 y.o. female with history of COPD, seizures, aortic valve stenosis, breast cancer in remission who comes in with dizziness.  Patient reports just feeling like she was spinning around around.  She was standing outside of the car when she had loss of consciousness.  Family was nearby her and caught her and lowered her to the ground.  They are able to bring her inside.  Afterwards she had a few episodes of nausea and vomiting.  She reported some right flank pain.  They said that they put her in bed and gave her meclizine and she seemed out of it for the next few hours.  They then stated that they brought her to the ER because she was not acting her normal self.  Patient states that she now is feeling better.  She denies any dizziness right now.  Denies any abdominal pain denies any back pain.  She has been compliant with her Keppra and denies any missed doses.  She denies any shortness of breath.  Denies any chest pain.     Past Medical History:  Diagnosis Date  . Arthritis   . Atherosclerosis 2019   Dr Ubaldo Glassing  . Breast cancer ([place])   . Cancer ([place]) 11-11-91   left breast   . Cancer ([place]) 08-12-14   right breast, ER/PR positive, Her2 negative  . COPD (chronic obstructive pulmonary disease) ([place])   . Heart valve problem    leaking  . History of colonoscopy 2012  . History of mammogram 2016  . Personal history of radiation therapy 2016   Rt breast  . Personal history of radiation therapy 1993   Left breast  . Pneumonia July 2014    Patient Active Problem List   Diagnosis Date Noted  . GI bleed 08/25/2017  . Lumbar compression fracture  ([place]) 08/16/2017  . Malignant neoplasm of upper-inner quadrant of right breast in female, estrogen receptor positive ([place]) 04/01/2016  . History of breast cancer 08/10/2015  . Iron deficiency anemia due to chronic blood loss 02/03/2015  . Carcinoma of right breast ([place]) 10/12/2014  . Malignant neoplasm of hepatic flexure of colon ([place]) 09/29/2014  . Aortic heart valve narrowing 03/31/2014  . Benign neoplasm of colon 08/28/2013  . HLD (hyperlipidemia) 08/28/2013  . Hypercholesterolemia without hypertriglyceridemia 08/28/2013    Past Surgical History:  Procedure Laterality Date  . BREAST BIOPSY Bilateral [id number]   Positive  . BREAST LUMPECTOMY Left 1993  . BREAST LUMPECTOMY Right 2016  . BREAST SURGERY Left 11-11-91   lumpectomy with radiation, Tamoxifen for 2 years.Dr. Sharlet Salina  . BREAST SURGERY Right 09/09/14   RIGHT BREAST WIDE EXCISION   . CHOLECYSTECTOMY  02/23/1972  . COLONOSCOPY    . COLONOSCOPY WITH PROPOFOL N/A 08/27/2017   Procedure: COLONOSCOPY WITH PROPOFOL;  Surgeon: Toledo, Benay Pike, MD;  Location: [facility] ENDOSCOPY;  Service: Gastroenterology;  Laterality: N/A;  . ESOPHAGOGASTRODUODENOSCOPY (EGD) WITH PROPOFOL N/A 08/26/2017   Procedure: ESOPHAGOGASTRODUODENOSCOPY (EGD) WITH PROPOFOL;  Surgeon: Toledo, Benay Pike, MD;  Location: [facility] ENDOSCOPY;  Service: Gastroenterology;  Laterality: N/A;  . KYPHOPLASTY N/A 08/17/2017   Procedure: [id number];  Surgeon: Hessie Knows, MD;  Location: [facility] ORS;  Service: Orthopedics;  Laterality: N/A;    Prior to Admission medications   Medication Sig Start Date End Date Taking? Authorizing Provider  acetaminophen (TYLENOL) 650 MG CR tablet Take 1 tablet by mouth daily.    [provider]  amiodarone (PACERONE) 200 MG tablet Take 1 tablet (200 mg total) by mouth 2 (two) times daily. 08/28/17   Gladstone Lighter, MD  amLODipine (NORVASC) 5 MG tablet Take 1 tablet by mouth daily. 10/30/17   [provider]  Iron, Ferrous  Gluconate, 256 (28 Fe) MG TABS Take by mouth daily.    [provider]  letrozole (FEMARA) 2.5 MG tablet Take 1 tablet (2.5 mg total) by mouth daily. 05/27/19   Cammie Sickle, MD  levETIRAcetam (KEPPRA) 1000 MG tablet Take 1 tablet by mouth daily. 10/26/17   [provider]  Magnesium 250 MG TABS Take 1 tablet by mouth daily.     [provider]  meclizine (ANTIVERT) 25 MG tablet Take 25 mg by mouth 2 (two) times daily as needed for dizziness.    [provider]  metoprolol succinate (TOPROL-XL) 25 MG 24 hr tablet  11/07/18   [provider]  metoprolol tartrate (LOPRESSOR) 25 MG tablet Take 25 mg by mouth daily.    [provider]  omeprazole (PRILOSEC) 40 MG capsule Take 40 mg by mouth daily.    [provider]  pantoprazole (PROTONIX) 40 MG tablet Take 1 tablet (40 mg total) by mouth daily. Patient not taking: Reported on 11/26/2018 08/28/17   Gladstone Lighter, MD  [facility] HFA [phone] Base) MCG/ACT inhaler Inhale 1 puff into the lungs every 6 (six) hours as needed for wheezing or shortness of breath.  05/31/16   [provider]  simvastatin (ZOCOR) 20 MG tablet Take 1 tablet by mouth daily.    [provider]  traMADol (ULTRAM) 50 MG tablet Take 1 tablet (50 mg total) by mouth 2 (two) times daily. 08/28/17   Gladstone Lighter, MD  vitamin B-12 (CYANOCOBALAMIN) 250 MCG tablet Take 250 mcg by mouth daily.    [provider]    Allergies Amoxicillin-pot clavulanate, Other, and Sulfa antibiotics  Family History  Problem Relation Age of Onset  . Colon cancer Mother 85  . Colon cancer Brother 62  . Breast cancer Sister 90    Social History Social History   Tobacco Use  . Smoking status: Never Smoker  . Smokeless tobacco: Never Used  Substance Use Topics  . Alcohol use: No    Alcohol/week: 0.0 standard drinks  . Drug use: No      Review of Systems Constitutional: No fever/chills Eyes: No  visual changes. ENT: No sore throat. Cardiovascular: Denies chest pain.  Dizziness  respiratory: Denies shortness of breath. Gastrointestinal: No abdominal pain.  Positive nausea, vomiting.  No diarrhea.  No constipation. Genitourinary: Negative for dysuria. Musculoskeletal: Right back pain Skin: Negative for rash. Neurological: Negative for headaches, focal weakness or numbness. All other ROS negative ____________________________________________   PHYSICAL EXAM:  VITAL SIGNS: ED Triage Vitals  Enc Vitals Group     BP 12/03/19 2148 (!) 132/56     Pulse Rate 12/03/19 2148 (!) 55     Resp 12/03/19 2148 16     Temp 12/03/19 2148 98.5 F (36.9 C)     Temp Source 12/03/19 2148 Oral     SpO2 12/03/19 2148 94 %     Weight 12/03/19 2155 130 lb (59 kg)     Height 12/03/19 2155 '5\' 5"'  (  1.651 m)     Head Circumference --      Peak Flow --      Pain Score 12/03/19 2155 0     Pain Loc --      Pain Edu? --      Excl. in [place]? --     Constitutional: Alert and oriented. Well appearing and in no acute distress. Eyes: Conjunctivae are normal. EOMI. Head: Atraumatic. Nose: No congestion/rhinnorhea. Mouth/Throat: Mucous membranes are moist.   Neck: No stridor. Trachea Midline. FROM Cardiovascular: Normal rate, regular rhythm. Grossly normal heart sounds.  Good peripheral circulation. Respiratory: Normal respiratory effort.  No retractions. Lungs CTAB. Gastrointestinal: Soft and nontender. No distention. No abdominal bruits.  Musculoskeletal: No lower extremity tenderness nor edema.  No joint effusions. Neurologic:  Normal speech and language. No gross focal neurologic deficits are appreciated.  Cranial nerves are intact.  Equal strength in arms and legs.  Finger-to-nose of the left is abnormal Skin:  Skin is warm, dry and intact. No rash noted. Psychiatric: Mood and affect are normal. Speech and behavior are normal. GU: Deferred  Back: No rash noted, no tenderness with  palpation ____________________________________________   LABS (all labs ordered are listed, but only abnormal results are displayed)  Labs Reviewed  BASIC METABOLIC PANEL - Abnormal; Notable for the following components:      Result Value   Glucose, Bld 130 (*)    BUN 25 (*)    Creatinine, Ser 1.48 (*)    GFR calc non Af Amer 30 (*)    GFR calc Af Amer 35 (*)    All other components within normal limits  CBC - Abnormal; Notable for the following components:   WBC 11.9 (*)    RBC 3.81 (*)    HCT 35.9 (*)    All other components within normal limits  SARS CORONAVIRUS 2 BY RT PCR (HOSPITAL ORDER, [place] [facility])  URINALYSIS, COMPLETE (UACMP) WITH MICROSCOPIC  CREATININE, SERUM  URINALYSIS, ROUTINE W REFLEX MICROSCOPIC  CBG MONITORING, ED  TROPONIN I (HIGH SENSITIVITY)  TROPONIN I (HIGH SENSITIVITY)   ____________________________________________   ED ECG REPORT I, Vanessa [place], the attending physician, personally viewed and interpreted this ECG.  Sinus bradycardia rate of 56, orbital bite block, no ST elevation, T wave inversion in lead 3,4 first-degree AV block ____________________________________________  RADIOLOGY Robert Bellow, personally viewed and evaluated these images (plain radiographs) as part of my medical decision making, as well as reviewing the written report by the radiologist.  ED MD interpretation: No pneumonia  Official radiology report(s): DG Chest 1 View  Result Date: 12/03/2019 CLINICAL DATA:  Dizziness EXAM: CHEST  1 VIEW COMPARISON:  11/14/2012 FINDINGS: Two frontal views of the chest demonstrate a stable cardiac silhouette. There is marked calcification of the mitral annulus. No airspace disease, effusion, or pneumothorax. Prior lower thoracic compression deformities with evidence of vertebral augmentation. No acute bony abnormalities. IMPRESSION: 1. No acute intrathoracic process. Electronically Signed   By: Randa Ngo  M.D.   On: 12/03/2019 23:40   CT Head Wo Contrast  Result Date: 12/03/2019 CLINICAL DATA:  Vertigo EXAM: CT HEAD WITHOUT CONTRAST TECHNIQUE: Contiguous axial images were obtained from the base of the skull through the vertex without intravenous contrast. COMPARISON:  01/17/2017 FINDINGS: Brain: No acute infarct or hemorrhage. Lateral ventricles and midline structures are unremarkable. No acute extra-axial fluid collections. No mass effect. Vascular: No hyperdense vessel or unexpected calcification. Skull: Normal. Negative for fracture or  focal lesion. Sinuses/Orbits: No acute finding. Other: None. IMPRESSION: 1. No acute intracranial process. Electronically Signed   By: Randa Ngo M.D.   On: 12/03/2019 23:51    ____________________________________________   PROCEDURES  Procedure(s) performed (including Critical Care):  Procedures   ____________________________________________   INITIAL IMPRESSION / ASSESSMENT AND PLAN / ED COURSE  Kristy Hardy was evaluated in Emergency Department on 12/04/2019 for the symptoms described in the history of present illness. She was evaluated in the context of the global COVID-19 pandemic, which necessitated consideration that the patient might be at risk for infection with the SARS-CoV-2 virus that causes COVID-19. Institutional protocols and algorithms that pertain to the evaluation of patients at risk for COVID-19 are in a state of rapid change based on information released by regulatory bodies including the CDC and federal and state organizations. These policies and algorithms were followed during the patient's care in the ED.    Patient is a 86 year old who comes in with episode of loss of consciousness.  It is unclear exactly what precipitated this.  It could be syncope given patient does have a history of aortic stenosis does not undergo repair.  Could have been a seizure although she is been compliant with her Keppra and this is unlike she  had any seizure activity or urinating on herself although potentially it sounds like she could have been postictal?.  Possible stroke given the dizziness nausea, vomiting although the symptoms have seem to gotten better she does have a little bit of discoordination with finger-to-nose on her left side.  Basic labs were ordered to evaluate for Electra abnormalities, AKI, UTI. CT head to rule out ICH.  At this time her abdomen is soft and nontender and she is got no right flank tenderness I have lower suspicion for acute abdominal pathology.  Her oxygen levels were between 90 and 94% but she denies any worsening shortness of breath and has a history of COPD so unlikely to be PE  Kidney function is around baseline at 1.48. White count slightly elevated 11.9 Hemoglobin stable at 12.8 Initial troponin was 13 CT head was negative  We will discuss possible team for admission for further work-up for possible stroke, syncope, monitor for possible seizures        ____________________________________________   FINAL CLINICAL IMPRESSION(S) / ED DIAGNOSES   Final diagnoses:  Syncope and collapse      MEDICATIONS GIVEN DURING THIS VISIT:  Medications  sodium chloride flush (NS) 0.9 % injection 3 mL (has no administration in time range)  enoxaparin (LOVENOX) injection 30 mg (has no administration in time range)  0.9 %  sodium chloride infusion (has no administration in time range)  acetaminophen (TYLENOL) tablet 650 mg (has no administration in time range)    Or  acetaminophen (TYLENOL) suppository 650 mg (has no administration in time range)  ondansetron (ZOFRAN) tablet 4 mg (has no administration in time range)    Or  ondansetron (ZOFRAN) injection 4 mg (has no administration in time range)  levETIRAcetam (KEPPRA) tablet 1,000 mg (1,000 mg Oral Given 12/04/19 0250)     ED Discharge Orders    None       Note:  This document was prepared using Dragon voice recognition software and  may include unintentional dictation errors.   Vanessa [place], MD 12/04/19 [phone]

## 2019-12-04 NOTE — Progress Notes (Signed)
Patient in room after getting MRI.

## 2019-12-04 NOTE — Progress Notes (Signed)
Report received from Noah, RN

## 2019-12-04 NOTE — ED Notes (Signed)
Pt reports no seizures for two years. Pt reports dizzy spells today, with nausea and emesis x4. Back pain began at same time. Pt reports taking meclozine TID, recently increased

## 2019-12-05 LAB — CBC
HCT: 36.1 % (ref 36.0–46.0)
Hemoglobin: 13.2 g/dL (ref 12.0–15.0)
MCH: 34 pg (ref 26.0–34.0)
MCHC: 36.6 g/dL — ABNORMAL HIGH (ref 30.0–36.0)
MCV: 93 fL (ref 80.0–100.0)
Platelets: 198 10*3/uL (ref 150–400)
RBC: 3.88 MIL/uL (ref 3.87–5.11)
RDW: 12.7 % (ref 11.5–15.5)
WBC: 9.7 10*3/uL (ref 4.0–10.5)
nRBC: 0 % (ref 0.0–0.2)

## 2019-12-05 LAB — BASIC METABOLIC PANEL
Anion gap: 8 (ref 5–15)
BUN: 15 mg/dL (ref 8–23)
CO2: 29 mmol/L (ref 22–32)
Calcium: 8.5 mg/dL — ABNORMAL LOW (ref 8.9–10.3)
Chloride: 102 mmol/L (ref 98–111)
Creatinine, Ser: 0.95 mg/dL (ref 0.44–1.00)
GFR calc Af Amer: 60 mL/min — ABNORMAL LOW (ref >60)
GFR calc non Af Amer: 52 mL/min — ABNORMAL LOW (ref >60)
Glucose, Bld: 108 mg/dL — ABNORMAL HIGH (ref 70–99)
Potassium: 2.7 mmol/L — CL (ref 3.5–5.1)
Sodium: 139 mmol/L (ref 135–145)

## 2019-12-05 LAB — GLUCOSE, CAPILLARY
Glucose-Capillary: 103 mg/dL — ABNORMAL HIGH (ref 70–99)
Glucose-Capillary: 131 mg/dL — ABNORMAL HIGH (ref 70–99)

## 2019-12-05 MED ORDER — POTASSIUM CHLORIDE CRYS ER 20 MEQ PO TBCR
40.0000 meq | EXTENDED_RELEASE_TABLET | ORAL | Status: AC
  Administered 2019-12-05 (×3): 40 meq via ORAL
  Filled 2019-12-05 (×2): qty 2

## 2019-12-05 MED ORDER — ENOXAPARIN SODIUM 40 MG/0.4ML ~~LOC~~ SOLN
40.0000 mg | SUBCUTANEOUS | Status: DC
  Administered 2019-12-06: 12:00:00 40 mg via SUBCUTANEOUS
  Filled 2019-12-05: qty 0.4

## 2019-12-05 MED ORDER — LEVETIRACETAM 500 MG PO TABS
1000.0000 mg | ORAL_TABLET | Freq: Every day | ORAL | Status: DC
  Administered 2019-12-05 – 2019-12-06 (×2): 1000 mg via ORAL
  Filled 2019-12-05 (×3): qty 2

## 2019-12-05 NOTE — Progress Notes (Signed)
PHARMACIST - PHYSICIAN COMMUNICATION  CONCERNING:  Enoxaparin (Lovenox) for DVT Prophylaxis    RECOMMENDATION: Patient was prescribed enoxaprin 30mg  q24 hours for VTE prophylaxis and CrCl <30 ml/min  [facility] Weights   12/03/19 2155 12/05/19 0502  Weight: 59 kg (130 lb) 52.1 kg (114 lb 12.8 oz)    Body mass index is 19.1 kg/m.  Estimated Creatinine Clearance: 30.4 mL/min (by C-G formula based on SCr of 0.95 mg/dL).  CrCl now improved to >100ml/min  Based on [facility] policy patient is candidate for enoxaparin 30mg  every 24 hours now that  CrCl >62ml/min   DESCRIPTION: Pharmacy has adjusted enoxaparin dose per [facility] policy.  Patient is now receiving enoxaparin 40mg  every 24 hours.   Pernell Dupre, PharmD Clinical Pharmacist  12/05/2019 10:14 AM\

## 2019-12-05 NOTE — Progress Notes (Signed)
Physical Therapy Treatment Patient Details Name: Kristy Hardy MRN: [id number] DOB: Jan 23, 1925 Today's Date: 12/05/2019    History of Present Illness Pt is a 86 y.o. female  with medical history significant for seizure disorder with last seizure 2 years prior on Keppra, hypertension,CKD, breast cancer, chronic vertigo on meclizine, COPD who was in her usual state of health, until last night of when she had an episode where while standing had episode of vertigo and fell.  No reports of LOC. Son witnessed the event. Similar episode few days prior.    PT Comments    Pt was long sitting in bed upon arriving. She is alert however disoriented. Only oriented to self. Was able to follow commands but required increased time to process.  Per discussion with pt's daughter (via telephone) pt was having some cognition changes over past few weeks. Has MD(shaw) appointment scheduled for 12/17/19. Pt was agreeable to PT session and cooperative however does require re-directing to focus on desired task. She required min assist to exit bed, CGA to stand but Min assist throughout ambulation to prevent LOB/fall. Therapist recommends RN staff use RW for all times pt is on her feet. She overall tolerated session well. Therapist discussed pt's status with pt's daughter and recommendation for 24 hour supervision if pt is to DC home. Plan is to DC home when medically stable. Recommending continued skilled PT at DC to address deficits and assist pt to PLOF. Pt's daughter to discuss with brother (pt's son) if able to accommodate 24 hour supervision recommendation.     Follow Up Recommendations  Supervision/Assistance - 24 hour;Home health PT     Equipment Recommendations  Cane    Recommendations for Other Services       Precautions / Restrictions Precautions Precautions: Fall Restrictions Weight Bearing Restrictions: No    Mobility  Bed Mobility Overal bed mobility: Needs Assistance Bed Mobility:  Supine to Sit     Supine to sit: Min assist     General bed mobility comments: Increased time to perform. pt struggled with initiation and progression. increased time to process vcs/sequencing  Transfers Overall transfer level: Needs assistance Equipment used: Straight cane Transfers: Sit to/from Stand Sit to Stand: Min guard;Min assist         General transfer comment: pt was able to stand with CGA but required min assist for safety for eccentric controlled lowering to sitting  Ambulation/Gait Ambulation/Gait assistance: Min assist Gait Distance (Feet): 80 Feet Assistive device: Straight cane Gait Pattern/deviations: Step-through pattern;Narrow base of support;Drifts right/left Gait velocity: decreased   General Gait Details: pt was able to ambulate 80 ft with SPC. has several occasions of unsteadiness and required Min assist to prevent fall. therapist recommends RN staff use RW for mobility at this time. Pt is highfall risk   Stairs             Wheelchair Mobility    Modified Rankin (Stroke Patients Only)       Balance Overall balance assessment: Needs assistance Sitting-balance support: Feet supported Sitting balance-Leahy Scale: Good     Standing balance support: Single extremity supported;During functional activity Standing balance-Leahy Scale: Poor Standing balance comment: pt is unsteady on feet with +1 UE. fair balance with BUE support on RW                            Cognition Arousal/Alertness: Awake/alert Behavior During Therapy: Anxious Overall Cognitive Status: Impaired/Different from baseline Area of Impairment:  Orientation;Attention;Following commands;Safety/judgement;Awareness                 Orientation Level: Disoriented to;Place;Time;Situation Current Attention Level: Focused   Following Commands: Follows one step commands consistently;Follows multi-step commands inconsistently;Follows one step commands with  increased time Safety/Judgement: Decreased awareness of safety;Decreased awareness of deficits Awareness: Intellectual   General Comments: Pt was alert throughout session however has altered orientation/ability to follow commands. Therapist reached out to pt's daughter and per daughter,"  We have noticed change is cognition over last few weeks." Pt is scheduled to see Mr. Brigitte Pulse 7/ 27 about cognition changes.      Exercises      General Comments        Pertinent Vitals/Pain Pain Assessment: No/denies pain Pain Location:  (reported LBP in past)    Home Living                      Prior Function            PT Goals (current goals can now be found in the care plan section) Acute Rehab PT Goals Patient Stated Goal: go home Progress towards PT goals: Progressing toward goals    Frequency    Min 2X/week      PT Plan Current plan remains appropriate    Co-evaluation              AM-PAC PT "6 Clicks" Mobility   Outcome Measure  Help needed turning from your back to your side while in a flat bed without using bedrails?: A Little Help needed moving from lying on your back to sitting on the side of a flat bed without using bedrails?: A Little Help needed moving to and from a bed to a chair (including a wheelchair)?: A Little Help needed standing up from a chair using your arms (e.g., wheelchair or bedside chair)?: A Little Help needed to walk in hospital room?: A Little Help needed climbing 3-5 steps with a railing? : A Little 6 Click Score: 18    End of Session Equipment Utilized During Treatment: Gait belt Activity Tolerance: Patient tolerated treatment well Patient left: in chair;with chair alarm set;with call bell/phone within reach Nurse Communication: Mobility status PT Visit Diagnosis: Other abnormalities of gait and mobility (R26.89);Muscle weakness (generalized) (M62.81);Difficulty in walking, not elsewhere classified (R26.2);Pain     Time:  1450-1513 PT Time Calculation (min) (ACUTE ONLY): 23 min  Charges:  $Gait Training: 8-22 mins $Therapeutic Activity: 8-22 mins                     Julaine Fusi PTA 12/05/19, 4:51 PM

## 2019-12-05 NOTE — Progress Notes (Signed)
PROGRESS NOTE    Kristy Hardy  KAJ:[id number] DOB: 05-08-1925 DOA: 12/04/2019 PCP: [name], MD   Assessment & Plan:   Principal Problem:   Syncope and collapse Active Problems:   Aortic valve stenosis- mild   Nausea and vomiting   Vertigo   Seizure disorder (HCC)   COPD with chronic bronchitis (HCC)   Acute renal failure superimposed on chronic kidney disease (HCC)   Syncope   Syncope: etiology unclear, possible dehydration vs orthostatic hypotension. Orthostatics are positive but improved from day prior. CT brain with no acute intracranial findings. MRI shows no acute intracranial abnormalities noted. Continue on tele   Generalized weakness: unsteady on feet as per pt's nurse. Will have PT re-evaluate.   Altered mental status: etiology unclear, possible delirium vs dementia. Has an outpatient appt w/ Dr. Manuella Ghazi, neuro for evaluation of dementia   Hypokalemia: KCl repleted. Will continue to monitor   Aortic valve stenosis: mild. Followed by Dr. Ubaldo Glassing  Nausea and vomiting: possibly related to vertigo. Resolved  AKI on CKDIIIb: Cr is better than baseline today. Will continue to monitor   Acute on chronic back pain: with history of kyphoplasty. Continue on tramadol. Follows with Dr. Rudene Christians. Back XRs shows remote T12, L1 compression fractures w/ advanced lumbar spine degeneration   Seizure disorder: unknown type. Continue keppra  COPD with chronic bronchitis: w/o exacerbation. DuoNebs prn   DVT prophylaxis: lovenox  Code Status: full Family Communication: discussed pt's care w/ pt's daughter, Joycelyn Schmid, and answered her questions  Disposition Plan: likely d/c home w/ home health but PT will re-eval   Status is: Inpatient  Remains inpatient appropriate because: unsafe d/c plan   Dispo: The patient is from: Home              Anticipated d/c is to: Home w/ home health               Anticipated d/c date is: 1 day              Patient currently is not  medically stable to d/c.   Consultants:      Procedures:   Antimicrobials:   Subjective: Pt c/o fatigue   Objective: Vitals:   12/04/19 1639 12/04/19 1949 12/05/19 0140 12/05/19 0502  BP: (!) 149/66 (!) 129/52 (!) 154/67 (!) 153/54  Pulse: (!) 59 (!) 55 66 62  Resp: (!) 22 16 17 17   Temp: 97.9 F (36.6 C) 98.3 F (36.8 C) 98.3 F (36.8 C) 97.7 F (36.5 C)  TempSrc: Oral Oral  Oral  SpO2: 97% 95% 94% 94%  Weight:    52.1 kg  Height:        Intake/Output Summary (Last 24 hours) at 12/05/2019 0733 Last data filed at 12/05/2019 0410 Gross per 24 hour  Intake 1735.16 ml  Output 350 ml  Net 1385.16 ml   Filed Weights   12/03/19 2155 12/05/19 0502  Weight: 59 kg 52.1 kg    Examination:  General exam: Appears calm and comfortable  Respiratory system: Clear to auscultation. No rales, rhonchi  Cardiovascular system: S1 & S2 +. No rubs, gallops or clicks. Gastrointestinal system: Abdomen is nondistended, soft and nontender. Normal bowel sounds heard. Central nervous system: Alert and oriented. Moves all 4 extremities  Psychiatry: Judgement and insight appear normal. Mood & affect appropriate.     Data Reviewed: I have personally reviewed following labs and imaging studies  CBC: Recent Labs  Lab 12/03/19 2204 12/05/19 0457  WBC 11.9* 9.7  HGB 12.8 13.2  HCT 35.9* 36.1  MCV 94.2 93.0  PLT 210 235   Basic Metabolic Panel: Recent Labs  Lab 12/03/19 2204 12/05/19 0457  NA 142 139  K 3.9 2.7*  CL 101 102  CO2 31 29  GLUCOSE 130* 108*  BUN 25* 15  CREATININE 1.48* 0.95  CALCIUM 9.1 8.5*   GFR: Estimated Creatinine Clearance: 30.4 mL/min (by C-G formula based on SCr of 0.95 mg/dL). Liver Function Tests: No results for input(s): AST, ALT, ALKPHOS, BILITOT, PROT, ALBUMIN in the last 168 hours. No results for input(s): LIPASE, AMYLASE in the last 168 hours. No results for input(s): AMMONIA in the last 168 hours. Coagulation Profile: No results for  input(s): INR, PROTIME in the last 168 hours. Cardiac Enzymes: No results for input(s): CKTOTAL, CKMB, CKMBINDEX, TROPONINI in the last 168 hours. BNP (last 3 results) No results for input(s): PROBNP in the last 8760 hours. HbA1C: No results for input(s): HGBA1C in the last 72 hours. CBG: Recent Labs  Lab 12/04/19 0859 12/05/19 0628  GLUCAP 100* 103*   Lipid Profile: No results for input(s): CHOL, HDL, LDLCALC, TRIG, CHOLHDL, LDLDIRECT in the last 72 hours. Thyroid Function Tests: No results for input(s): TSH, T4TOTAL, FREET4, T3FREE, THYROIDAB in the last 72 hours. Anemia Panel: No results for input(s): VITAMINB12, FOLATE, FERRITIN, TIBC, IRON, RETICCTPCT in the last 72 hours. Sepsis Labs: No results for input(s): PROCALCITON, LATICACIDVEN in the last 168 hours.  Recent Results (from the past 240 hour(s))  SARS Coronavirus 2 by RT PCR (hospital order, performed in [facility] hospital lab) Nasopharyngeal Nasopharyngeal Swab     Status: None   Collection Time: 12/04/19  4:59 AM   Specimen: Nasopharyngeal Swab  Result Value Ref Range Status   SARS Coronavirus 2 NEGATIVE NEGATIVE Final    Comment: (NOTE) SARS-CoV-2 target nucleic acids are NOT DETECTED.  The SARS-CoV-2 RNA is generally detectable in upper and lower respiratory specimens during the acute phase of infection. The lowest concentration of SARS-CoV-2 viral copies this assay can detect is 250 copies / mL. A negative result does not preclude SARS-CoV-2 infection and should not be used as the sole basis for treatment or other patient management decisions.  A negative result may occur with improper specimen collection / handling, submission of specimen other than nasopharyngeal swab, presence of viral mutation(s) within the areas targeted by this assay, and inadequate number of viral copies (<250 copies / mL). A negative result must be combined with clinical observations, patient history, and epidemiological  information.  Fact Sheet for Patients:   StrictlyIdeas.no  Fact Sheet for Healthcare Providers: BankingDealers.co.za  This test is not yet approved or  cleared by the [place] FDA and has been authorized for detection and/or diagnosis of SARS-CoV-2 by FDA under an Emergency Use Authorization (EUA).  This EUA will remain in effect (meaning this test can be used) for the duration of the COVID-19 declaration under Section 564(b)(1) of the Act, 21 U.S.C. section 360bbb-3(b)(1), unless the authorization is terminated or revoked sooner.  Performed at [facility], [address], [place], [place] [address]          Radiology Studies: DG Chest 1 View  Result Date: 12/03/2019 CLINICAL DATA:  Dizziness EXAM: CHEST  1 VIEW COMPARISON:  11/14/2012 FINDINGS: Two frontal views of the chest demonstrate a stable cardiac silhouette. There is marked calcification of the mitral annulus. No airspace disease, effusion, or pneumothorax. Prior lower thoracic compression deformities with evidence of vertebral augmentation.  No acute bony abnormalities. IMPRESSION: 1. No acute intrathoracic process. Electronically Signed   By: Randa Ngo M.D.   On: 12/03/2019 23:40   DG Thoracic Spine 2 View  Result Date: 12/04/2019 CLINICAL DATA:  Vertigo. Chronic back pain. No history of recent falls. EXAM: THORACIC SPINE 2 VIEWS COMPARISON:  None available and similar FINDINGS: Remote T12 and L1 compression fractures with cement augmentation. There is advanced height loss with kyphotic deformity. No acute fracture is seen at the other levels. No evidence of bone lesion or endplate erosion. Osteopenia. Atherosclerosis and mitral annular ossification IMPRESSION: 1. No acute finding. 2. Remote T12 and L1 compression fractures with cement augmentation. Electronically Signed   By: Monte Fantasia M.D.   On: 12/04/2019 05:58   DG Lumbar Spine 2-3 Views  Result  Date: 12/04/2019 CLINICAL DATA:  Chronic back pain.  No recent falls EXAM: LUMBAR SPINE - 2-3 VIEW COMPARISON:  08/17/2017 FINDINGS: Remote T12 and L1 compression fractures. There has been cement augmentation at both levels. No acute fracture is seen elsewhere. Severe lumbar spine degeneration with diffuse disc collapse, endplate ridging, and multilevel listhesis. There is dextroscoliosis that is moderate. Generalized osteopenia IMPRESSION: 1. No acute finding. 2. Remote T12 and L1 compression fractures. 3. Advanced lumbar spine degeneration with multilevel listhesis and dextroscoliosis. Electronically Signed   By: Monte Fantasia M.D.   On: 12/04/2019 05:59   CT Head Wo Contrast  Result Date: 12/03/2019 CLINICAL DATA:  Vertigo EXAM: CT HEAD WITHOUT CONTRAST TECHNIQUE: Contiguous axial images were obtained from the base of the skull through the vertex without intravenous contrast. COMPARISON:  01/17/2017 FINDINGS: Brain: No acute infarct or hemorrhage. Lateral ventricles and midline structures are unremarkable. No acute extra-axial fluid collections. No mass effect. Vascular: No hyperdense vessel or unexpected calcification. Skull: Normal. Negative for fracture or focal lesion. Sinuses/Orbits: No acute finding. Other: None. IMPRESSION: 1. No acute intracranial process. Electronically Signed   By: Randa Ngo M.D.   On: 12/03/2019 23:51   MR BRAIN WO CONTRAST  Result Date: 12/04/2019 CLINICAL DATA:  Seizure disorder. EXAM: MRI HEAD WITHOUT CONTRAST TECHNIQUE: Multiplanar, multiecho pulse sequences of the brain and surrounding structures were obtained without intravenous contrast. COMPARISON:  Head CT from yesterday and brain MRI 01/17/2017 FINDINGS: Significantly motion degraded brain MRI. There is cerebral volume loss and periventricular chronic small vessel ischemia in keeping with age. No acute infarct, hydrocephalus, collection, or masslike finding. Xanthogranulomas of the choroid plexus that are  incidental. IMPRESSION: Unremarkable exam for age when allowing for significant motion artifact. Electronically Signed   By: Monte Fantasia M.D.   On: 12/04/2019 07:13   ECHOCARDIOGRAM COMPLETE  Result Date: 12/04/2019    ECHOCARDIOGRAM REPORT   Patient Name:   Elayah CORNELIA Adger Date of Exam: 12/04/2019 Medical Rec #:  [id number]               Height:       65.0 in Accession #:    [id number]              Weight:       130.0 lb Date of Birth:  [birth year]               BSA:          1.647 m Patient Age:    86 years                BP:           252/47 mmHg Patient Gender: F  HR:           57 bpm. Exam Location:  [facility] Procedure: 2D Echo, Cardiac Doppler and Color Doppler Indications:     syncope 780.2  History:         Patient has no prior history of Echocardiogram examinations.                  COPD. Breast cancer, heart valve problem.  Sonographer:     Sherrie Sport RDCS (AE) Referring Phys:  [id number] Athena Masse Diagnosing Phys: Kate Sable MD  Sonographer Comments: No apical window. IMPRESSIONS  1. Left ventricular ejection fraction, by estimation, is 65 to 70%. The left ventricle has normal function. The left ventricle has no regional wall motion abnormalities. There is moderate asymmetric left ventricular hypertrophy. Left ventricular diastolic function could not be evaluated.  2. Right ventricular systolic function is normal. The right ventricular size is normal.  3. The mitral valve is grossly normal. Trivial mitral valve regurgitation.  4. Aortic valve hemodynamics not measured in this study. visualization reveals approximately moderate aortic stenosis. The aortic valve was not well visualized. Aortic valve regurgitation is not visualized. FINDINGS  Left Ventricle: Left ventricular ejection fraction, by estimation, is 65 to 70%. The left ventricle has normal function. The left ventricle has no regional wall motion abnormalities. The left ventricular internal cavity size was  small. There is moderate  asymmetric left ventricular hypertrophy. Left ventricular diastolic function could not be evaluated. Right Ventricle: The right ventricular size is normal. No increase in right ventricular wall thickness. Right ventricular systolic function is normal. Left Atrium: Left atrial size was normal in size. Right Atrium: Right atrial size was not well visualized. Pericardium: There is no evidence of pericardial effusion. Mitral Valve: The mitral valve is grossly normal. Mild mitral annular calcification. Trivial mitral valve regurgitation. Tricuspid Valve: The tricuspid valve is grossly normal. Tricuspid valve regurgitation is mild. Aortic Valve: Aortic valve hemodynamics not measured in this study. visualization reveals approximately moderate aortic stenosis. The aortic valve was not well visualized. Aortic valve regurgitation is not visualized. Pulmonic Valve: The pulmonic valve was not well visualized. Pulmonic valve regurgitation is not visualized. Aorta: The aortic root is normal in size and structure. Venous: The inferior vena cava was not well visualized. IAS/Shunts: No atrial level shunt detected by color flow Doppler.  LEFT VENTRICLE PLAX 2D LVIDd:         3.62 cm LVIDs:         2.05 cm LV PW:         1.31 cm LV IVS:        1.55 cm LVOT diam:     2.00 cm LVOT Area:     3.14 cm  LEFT ATRIUM         Index LA diam:    3.30 cm 2.00 cm/m                        PULMONIC VALVE AORTA                 PV Vmax:        0.80 m/s Ao Root diam: 2.60 cm PV Peak grad:   2.6 mmHg                       RVOT Peak grad: 4 mmHg  TRICUSPID VALVE TR Peak grad:   43.0 mmHg TR Vmax:  328.00 cm/s  SHUNTS Systemic Diam: 2.00 cm Kate Sable MD Electronically signed by Kate Sable MD Signature Date/Time: 12/04/2019/10:31:52 AM    Final         Scheduled Meds: . amLODipine  2.5 mg Oral Daily  . enoxaparin (LOVENOX) injection  30 mg Subcutaneous Q24H  . potassium chloride  40 mEq Oral Q4H    . sodium chloride flush  3 mL Intravenous Q12H   Continuous Infusions: . sodium chloride 75 mL/hr at 12/05/19 0410     LOS: 1 day    Time spent: 32 mins    Wyvonnia Dusky, MD [facility] Pager [phone]  If 7PM-7AM, please contact night-coverage [email or web address] 12/05/2019, 7:33 AM

## 2019-12-05 NOTE — TOC Initial Note (Signed)
Transition of Care [facility]) - Initial/Assessment Note    Patient Details  Name: Kristy Hardy MRN: [id number] Date of Birth: [birth year]  Transition of Care [facility]) CM/SW Contact:    Shelbie Hutching, RN Phone Number: 12/05/2019, 9:33 AM  Clinical Narrative:                 Patient admitted to the hospital after a syncopal episode at home.  RNCM introduced self and explained role.  Patient is sitting up in bed alert.  Patient reports that she lives alone and is independent, she has a cane that she uses.  Patient reports she needs a walker.  Patient does not drive her son, Elta Guadeloupe, gets her groceries and her daughter, Joycelyn Schmid, takes her to appointments.  Patient is agreeable to home health services and reports that several years ago she had a nurse and PT coming out but she does not remember which agency it was from.   RNCM will cont to follow.    Expected Discharge Plan: [place] Barriers to Discharge: Continued Medical Work up   Patient Goals and CMS Choice Patient states their goals for this hospitalization and ongoing recovery are:: Patient wants some clothes from home to wear CMS Medicare.gov Compare Post Acute Care list provided to:: Patient Choice offered to / list presented to : Patient  Expected Discharge Plan and Services Expected Discharge Plan: [place]   Discharge Planning Services: CM Consult Post Acute Care Choice: Home Health, Durable Medical Equipment Living arrangements for the past 2 months: Single Family Home                           HH Arranged: PT, OT          Prior Living Arrangements/Services Living arrangements for the past 2 months: Single Family Home Lives with:: Self Patient language and need for interpreter reviewed:: Yes Do you feel safe going back to the place where you live?: Yes      Need for Family Participation in Patient Care: Yes (Comment) (syncopal episodes) Care giver support system in place?:  Yes (comment) (daughter and son) Current home services: DME (cane) Criminal Activity/Legal Involvement Pertinent to Current Situation/Hospitalization: No - Comment as needed  Activities of Daily Living Home Assistive Devices/Equipment: Cane (specify quad or straight) ADL Screening (condition at time of admission) Patient's cognitive ability adequate to safely complete daily activities?: Yes Is the patient deaf or have difficulty hearing?: No Does the patient have difficulty seeing, even when wearing glasses/contacts?: No Does the patient have difficulty concentrating, remembering, or making decisions?: No Patient able to express need for assistance with ADLs?: Yes Does the patient have difficulty dressing or bathing?: No Independently performs ADLs?: Yes (appropriate for developmental age) Does the patient have difficulty walking or climbing stairs?: Yes Weakness of Legs: Both Weakness of Arms/Hands: None  Permission Sought/Granted Permission sought to share information with : Case Manager, Family Supports, Other (comment) Permission granted to share information with : Yes, Verbal Permission Granted  Share Information with NAME: Joycelyn Schmid  Permission granted to share info w AGENCY: [place] agency  Permission granted to share info w Relationship: daughter     Emotional Assessment Appearance:: Appears stated age Attitude/Demeanor/Rapport: Engaged Affect (typically observed): Accepting Orientation: : Oriented to Self, Oriented to Place, Oriented to  Time, Oriented to Situation Alcohol / Substance Use: Not Applicable Psych Involvement: No (comment)  Admission diagnosis:  Syncope and collapse [  R55] Chronic back pain [M54.9, G89.29] Syncope [R55] Patient Active Problem List   Diagnosis Date Noted  . Syncope and collapse 12/04/2019  . Nausea and vomiting 12/04/2019  . Vertigo 12/04/2019  . Seizure disorder ([place]) 12/04/2019  . COPD with chronic bronchitis ([place]) 12/04/2019  .  Acute renal failure superimposed on chronic kidney disease ([place]) 12/04/2019  . Syncope 12/04/2019  . GI bleed 08/25/2017  . Lumbar compression fracture ([place]) 08/16/2017  . Malignant neoplasm of upper-inner quadrant of right breast in female, estrogen receptor positive ([place]) 04/01/2016  . History of breast cancer 08/10/2015  . Iron deficiency anemia due to chronic blood loss 02/03/2015  . Carcinoma of right breast ([place]) 10/12/2014  . Malignant neoplasm of hepatic flexure of colon ([place]) 09/29/2014  . Aortic valve stenosis- mild 03/31/2014  . Benign neoplasm of colon 08/28/2013  . HLD (hyperlipidemia) 08/28/2013  . Hypercholesterolemia without hypertriglyceridemia 08/28/2013   PCP:  [name], MD Pharmacy:   [facility] [phone] - [address] [address] Phone: [phone] Fax: [phone]     Social Determinants of Health (SDOH) Interventions    Readmission Risk Interventions No flowsheet data found.

## 2019-12-06 DIAGNOSIS — R42 Dizziness and giddiness: Secondary | ICD-10-CM

## 2019-12-06 DIAGNOSIS — R531 Weakness: Secondary | ICD-10-CM

## 2019-12-06 LAB — BASIC METABOLIC PANEL
Anion gap: 7 (ref 5–15)
BUN: 15 mg/dL (ref 8–23)
CO2: 27 mmol/L (ref 22–32)
Calcium: 8.4 mg/dL — ABNORMAL LOW (ref 8.9–10.3)
Chloride: 106 mmol/L (ref 98–111)
Creatinine, Ser: 1.09 mg/dL — ABNORMAL HIGH (ref 0.44–1.00)
GFR calc Af Amer: 51 mL/min — ABNORMAL LOW (ref >60)
GFR calc non Af Amer: 44 mL/min — ABNORMAL LOW (ref >60)
Glucose, Bld: 97 mg/dL (ref 70–99)
Potassium: 3.4 mmol/L — ABNORMAL LOW (ref 3.5–5.1)
Sodium: 140 mmol/L (ref 135–145)

## 2019-12-06 LAB — CBC
HCT: 34.9 % — ABNORMAL LOW (ref 36.0–46.0)
Hemoglobin: 12.7 g/dL (ref 12.0–15.0)
MCH: 33.7 pg (ref 26.0–34.0)
MCHC: 36.4 g/dL — ABNORMAL HIGH (ref 30.0–36.0)
MCV: 92.6 fL (ref 80.0–100.0)
Platelets: 191 10*3/uL (ref 150–400)
RBC: 3.77 MIL/uL — ABNORMAL LOW (ref 3.87–5.11)
RDW: 12.8 % (ref 11.5–15.5)
WBC: 7.1 10*3/uL (ref 4.0–10.5)
nRBC: 0 % (ref 0.0–0.2)

## 2019-12-06 MED ORDER — ENOXAPARIN SODIUM 30 MG/0.3ML ~~LOC~~ SOLN: 30.0000 mg | SUBCUTANEOUS | Status: DC

## 2019-12-06 MED ORDER — LETROZOLE 2.5 MG PO TABS
2.5000 mg | ORAL_TABLET | Freq: Every day | ORAL | Status: DC
  Administered 2019-12-06: 12:00:00 2.5 mg via ORAL
  Filled 2019-12-06: qty 1

## 2019-12-06 MED ORDER — IRON (FERROUS GLUCONATE) 256 (28 FE) MG PO TABS: 1.0000 | ORAL_TABLET | Freq: Every day | ORAL | 0 refills | Status: AC

## 2019-12-06 MED ORDER — METOPROLOL SUCCINATE ER 25 MG PO TB24
25.0000 mg | ORAL_TABLET | Freq: Every day | ORAL | Status: DC
  Administered 2019-12-06: 12:00:00 25 mg via ORAL
  Filled 2019-12-06: qty 1

## 2019-12-06 MED ORDER — SIMVASTATIN 20 MG PO TABS: 20.0000 mg | ORAL_TABLET | Freq: Every day | ORAL | Status: DC

## 2019-12-06 MED ORDER — MECLIZINE HCL 25 MG PO TABS
25.0000 mg | ORAL_TABLET | Freq: Three times a day (TID) | ORAL | Status: DC
  Administered 2019-12-06: 25 mg via ORAL
  Filled 2019-12-06 (×3): qty 1

## 2019-12-06 MED ORDER — POTASSIUM CHLORIDE CRYS ER 20 MEQ PO TBCR
20.0000 meq | EXTENDED_RELEASE_TABLET | Freq: Once | ORAL | Status: AC
  Administered 2019-12-06: 12:00:00 20 meq via ORAL
  Filled 2019-12-06: qty 1

## 2019-12-06 MED ORDER — AMIODARONE HCL 200 MG PO TABS
200.0000 mg | ORAL_TABLET | Freq: Two times a day (BID) | ORAL | Status: DC
  Administered 2019-12-06: 12:00:00 200 mg via ORAL
  Filled 2019-12-06: qty 1

## 2019-12-06 NOTE — TOC Transition Note (Signed)
Transition of Care [facility]) - CM/SW Discharge Note   Patient Details  Name: Kristy Hardy MRN: [id number] Date of Birth: 04/25/1925  Transition of Care [facility]) CM/SW Contact:  Shelbie Hutching, RN Phone Number: 12/06/2019, 1:50 PM   Clinical Narrative:    Patient is medically cleared for discharge to [facility] for short term rehab.  Patient will be going to bed 410, bedside RN to call report to [phone].  Patient will transport via [facility], this RNCM will arrange transport.  Family is aware of discharge today.    Final next level of care: Skilled Nursing Facility Barriers to Discharge: Barriers Resolved   Patient Goals and CMS Choice Patient states their goals for this hospitalization and ongoing recovery are:: daughter would like for the patient to go to rehab short term CMS Medicare.gov Compare Post Acute Care list provided to:: Patient Represenative (must comment) Choice offered to / list presented to : [facility] POA / Guardian, Adult Children (son Kristy Hardy and Daughter Kristy Hardy)  Discharge Placement   Existing PASRR number confirmed : 12/06/19            Patient to be transferred to facility by: [facility] Name of family member notified: Kristy Hardy and Kristy Hardy Patient and family notified of of transfer: 12/06/19  Discharge Plan and Services   Discharge Planning Services: CM Consult Post Acute Care Choice: Skilled Nursing Facility                    HH Arranged: PT, OT          Social Determinants of Health (SDOH) Interventions     Readmission Risk Interventions No flowsheet data found.

## 2019-12-06 NOTE — TOC Progression Note (Signed)
Transition of Care [facility]) - Progression Note    Patient Details  Name: Kristy Hardy MRN: [id number] Date of Birth: 12/06/1924  [facility]) CM/SW Contact  Shelbie Hutching, RN Phone Number: 12/06/2019, 1:10 PM  Clinical Narrative:     Daughter would like for patient to go to rehab short term.  Physical therapy is recommending SNF.  Bed search started.  Daughter says that patient has been to [facility] in the past and would like for her to go back if they will offer a bed.   Expected Discharge Plan: [place] Barriers to Discharge: Continued Medical Work up  Expected Discharge Plan and Services Expected Discharge Plan: [place]   Discharge Planning Services: CM Consult Post Acute Care Choice: [place] Living arrangements for the past 2 months: Single Family Home                           HH Arranged: PT, OT           Social Determinants of Health (SDOH) Interventions    Readmission Risk Interventions No flowsheet data found.

## 2019-12-06 NOTE — Discharge Summary (Signed)
Physician Discharge Summary  Kristy Hardy LOV:[id number] DOB: January 27, 1925 DOA: 12/04/2019  PCP: [name], MD  Admit date: 12/04/2019 Discharge date: 12/06/2019  Admitted From: home  Disposition:  SNF  Recommendations for Outpatient Follow-up:  1. Follow up with PCP in 1-2 weeks 2. F/u neuro, Dr. Manuella Ghazi, in 1-2 weeks  Home Health: no Equipment/Devices:  Discharge Condition: stable CODE STATUS: full  Diet recommendation: Heart Healthy   Brief/Interim Summary: HPI was taken from Dr. Damita Dunnings: Kristy Hardy is a 86 y.o. female with medical history significant for seizure disorder with last seizure 2 years prior, hypertension,CKD IIIb, chronic vertigo on meclizine, COPD, mild aortic stenosis followed by Dr. Ubaldo Glassing, history of breast cancer on letrozole, who was in her usual state of health, until the night of admission when she had an episode where while standing beside her, she appeared to lose consciousness and was helped to the ground by family.  She was then helped up and taken inside when she proceeded to vomit 4 times and started complaining of back pain.  She was given a dose of meclizine and rested for a few hours but then appeared to be not acting her usual self and family brought her to the emergency room.  Patient states she felt like the room spinning but denies one-sided numbness weakness or tingling.  Denies headache or visual disturbance.  Denies recent illness with no fever chills cough abdominal pain, or change in bowel habits ED Course: Patient was feeling back to her usual self by arrival in the emergency room and without dizziness, denying back pain and abdominal pain.  Afebrile, pulse 55, BP 132/56, O2 sat 94% on room air.  Creatinine 1.48 , baseline of 1.34, WBC 11.9, troponin 13.  Head CT negative.  Chest x-ray benign.  Urinalysis pending.  Hospitalist consulted for admission.  Hospital Course from Dr. Lenise Herald 7/14-7/16/21: Pt presented after a syncopal  episode of unknown etiology. Pt was found to have orthostatic hypotension. CT brain & MRI brain showed no acute intracranial findings. Neurology saw the pt and did not feel the pt needed anymore inpatient work-up. Of note, pt has hx of vertigo and similar syncopal episodes in the past. Furthermore, PT/OT initially saw the pt and recommended home health but upon re-evaluation therapy recommended SNF. For more information, please see other progress notes.    Discharge Diagnoses:  Principal Problem:   Syncope and collapse Active Problems:   Aortic valve stenosis- mild   Nausea and vomiting   Vertigo   Seizure disorder (HCC)   COPD with chronic bronchitis (HCC)   Acute renal failure superimposed on chronic kidney disease (HCC)   Syncope  Syncope: etiology unclear, possible dehydration vs orthostatic hypotension vs vertigo. CT brain with no acute intracranial findings. MRI shows no acute intracranial abnormalities noted. Continue on tele   Generalized weakness: PT recs SNF  Altered mental status: etiology unclear, possible delirium vs dementia. Has an outpatient appt w/ Dr. Manuella Ghazi, neuro for evaluation of dementia   Hypokalemia: KCl repleted again. Will continue to monitor   Aortic valve stenosis: mild. Followed by Dr. Ubaldo Glassing  Nausea and vomiting: possibly related to vertigo. Resolved  AKI on CKDIIIb: stable. Will continue to monitor   Acute onchronic back pain: with history of kyphoplasty. Continue on tramadol. Follows with Dr. Rudene Christians. Back XRs shows remote T12, L1 compression fractures w/ advanced lumbar spine degeneration   Seizure disorder: unknown type. Continue keppra  COPD with chronic bronchitis: w/o exacerbation. DuoNebs prn  Discharge Instructions  Discharge Instructions    Diet - low sodium heart healthy   Complete by: As directed    Discharge instructions   Complete by: As directed    F/u PCP in 1-2 weeks. F/u neuro, Dr. Manuella Ghazi, in 1-2 weeks   Increase activity  slowly   Complete by: As directed      Allergies as of 12/06/2019      Reactions   Amoxicillin-pot Clavulanate Nausea And Vomiting, Other (See Comments)   Has patient had a PCN reaction causing immediate rash, facial/tongue/throat swelling, SOB or lightheadedness with hypotension: No Has patient had a PCN reaction causing severe rash involving mucus membranes or skin necrosis: No Has patient had a PCN reaction that required hospitalization: No Has patient had a PCN reaction occurring within the last 10 years: Unknown If all of the above answers are "NO", then may proceed with Cephalosporin use.   Other Nausea And Vomiting   Sulfa Antibiotics Rash      Medication List    TAKE these medications   acetaminophen 650 MG CR tablet Commonly known as: TYLENOL Take 1 tablet by mouth daily.   amiodarone 200 MG tablet Commonly known as: PACERONE Take 1 tablet (200 mg total) by mouth 2 (two) times daily.   amLODipine 5 MG tablet Commonly known as: NORVASC Take 1 tablet by mouth daily.   fluticasone 50 MCG/ACT nasal spray Commonly known as: FLONASE Place 2 sprays into the nose daily.   Iron (Ferrous Gluconate) 256 (28 Fe) MG Tabs Take 1 tablet by mouth daily. What changed: how much to take   letrozole 2.5 MG tablet Commonly known as: FEMARA Take 1 tablet (2.5 mg total) by mouth daily.   levETIRAcetam 1000 MG tablet Commonly known as: KEPPRA Take 1 tablet by mouth daily.   LORazepam 0.5 MG tablet Commonly known as: ATIVAN Take 0.5 mg by mouth 2 (two) times daily.   Magnesium 250 MG Tabs Take 1 tablet by mouth daily.   meclizine 25 MG tablet Commonly known as: ANTIVERT Take 25 mg by mouth 3 (three) times daily.   metoprolol succinate 25 MG 24 hr tablet Commonly known as: TOPROL-XL Take 25 mg by mouth daily.   albuterol 108 (90 Base) MCG/ACT inhaler Commonly known as: VENTOLIN HFA Inhale into the lungs every 4 (four) hours as needed for wheezing or shortness of  breath.   ProAir HFA 108 (90 Base) MCG/ACT inhaler Generic drug: albuterol Inhale 1 puff into the lungs every 6 (six) hours as needed for wheezing or shortness of breath.   simvastatin 20 MG tablet Commonly known as: ZOCOR Take 1 tablet by mouth daily.   traMADol 50 MG tablet Commonly known as: ULTRAM Take 1 tablet (50 mg total) by mouth 2 (two) times daily.   vitamin B-12 250 MCG tablet Commonly known as: CYANOCOBALAMIN Take 250 mcg by mouth daily.       Allergies  Allergen Reactions  . Amoxicillin-Pot Clavulanate Nausea And Vomiting and Other (See Comments)    Has patient had a PCN reaction causing immediate rash, facial/tongue/throat swelling, SOB or lightheadedness with hypotension: No Has patient had a PCN reaction causing severe rash involving mucus membranes or skin necrosis: No Has patient had a PCN reaction that required hospitalization: No Has patient had a PCN reaction occurring within the last 10 years: Unknown If all of the above answers are "NO", then may proceed with Cephalosporin use.   . Other Nausea And Vomiting  . Sulfa Antibiotics Rash  Consultations: Neuro, Dr. Irish Elders   Procedures/Studies: DG Chest 1 View  Result Date: 12/03/2019 CLINICAL DATA:  Dizziness EXAM: CHEST  1 VIEW COMPARISON:  11/14/2012 FINDINGS: Two frontal views of the chest demonstrate a stable cardiac silhouette. There is marked calcification of the mitral annulus. No airspace disease, effusion, or pneumothorax. Prior lower thoracic compression deformities with evidence of vertebral augmentation. No acute bony abnormalities. IMPRESSION: 1. No acute intrathoracic process. Electronically Signed   By: Randa Ngo M.D.   On: 12/03/2019 23:40   DG Thoracic Spine 2 View  Result Date: 12/04/2019 CLINICAL DATA:  Vertigo. Chronic back pain. No history of recent falls. EXAM: THORACIC SPINE 2 VIEWS COMPARISON:  None available and similar FINDINGS: Remote T12 and L1 compression fractures  with cement augmentation. There is advanced height loss with kyphotic deformity. No acute fracture is seen at the other levels. No evidence of bone lesion or endplate erosion. Osteopenia. Atherosclerosis and mitral annular ossification IMPRESSION: 1. No acute finding. 2. Remote T12 and L1 compression fractures with cement augmentation. Electronically Signed   By: Monte Fantasia M.D.   On: 12/04/2019 05:58   DG Lumbar Spine 2-3 Views  Result Date: 12/04/2019 CLINICAL DATA:  Chronic back pain.  No recent falls EXAM: LUMBAR SPINE - 2-3 VIEW COMPARISON:  08/17/2017 FINDINGS: Remote T12 and L1 compression fractures. There has been cement augmentation at both levels. No acute fracture is seen elsewhere. Severe lumbar spine degeneration with diffuse disc collapse, endplate ridging, and multilevel listhesis. There is dextroscoliosis that is moderate. Generalized osteopenia IMPRESSION: 1. No acute finding. 2. Remote T12 and L1 compression fractures. 3. Advanced lumbar spine degeneration with multilevel listhesis and dextroscoliosis. Electronically Signed   By: Monte Fantasia M.D.   On: 12/04/2019 05:59   CT Head Wo Contrast  Result Date: 12/03/2019 CLINICAL DATA:  Vertigo EXAM: CT HEAD WITHOUT CONTRAST TECHNIQUE: Contiguous axial images were obtained from the base of the skull through the vertex without intravenous contrast. COMPARISON:  01/17/2017 FINDINGS: Brain: No acute infarct or hemorrhage. Lateral ventricles and midline structures are unremarkable. No acute extra-axial fluid collections. No mass effect. Vascular: No hyperdense vessel or unexpected calcification. Skull: Normal. Negative for fracture or focal lesion. Sinuses/Orbits: No acute finding. Other: None. IMPRESSION: 1. No acute intracranial process. Electronically Signed   By: Randa Ngo M.D.   On: 12/03/2019 23:51   MR BRAIN WO CONTRAST  Result Date: 12/04/2019 CLINICAL DATA:  Seizure disorder. EXAM: MRI HEAD WITHOUT CONTRAST TECHNIQUE:  Multiplanar, multiecho pulse sequences of the brain and surrounding structures were obtained without intravenous contrast. COMPARISON:  Head CT from yesterday and brain MRI 01/17/2017 FINDINGS: Significantly motion degraded brain MRI. There is cerebral volume loss and periventricular chronic small vessel ischemia in keeping with age. No acute infarct, hydrocephalus, collection, or masslike finding. Xanthogranulomas of the choroid plexus that are incidental. IMPRESSION: Unremarkable exam for age when allowing for significant motion artifact. Electronically Signed   By: Monte Fantasia M.D.   On: 12/04/2019 07:13   ECHOCARDIOGRAM COMPLETE  Result Date: 12/04/2019    ECHOCARDIOGRAM REPORT   Patient Name:   Kristy Hardy Date of Exam: 12/04/2019 Medical Rec #:  [id number]               Height:       65.0 in Accession #:    [id number]              Weight:       130.0 lb Date of Birth:  01/10/1925  BSA:          1.647 m Patient Age:    86 years                BP:           252/47 mmHg Patient Gender: F                       HR:           57 bpm. Exam Location:  [facility] Procedure: 2D Echo, Cardiac Doppler and Color Doppler Indications:     syncope 780.2  History:         Patient has no prior history of Echocardiogram examinations.                  COPD. Breast cancer, heart valve problem.  Sonographer:     Sherrie Sport RDCS (AE) Referring Phys:  [id number] Athena Masse Diagnosing Phys: Kate Sable MD  Sonographer Comments: No apical window. IMPRESSIONS  1. Left ventricular ejection fraction, by estimation, is 65 to 70%. The left ventricle has normal function. The left ventricle has no regional wall motion abnormalities. There is moderate asymmetric left ventricular hypertrophy. Left ventricular diastolic function could not be evaluated.  2. Right ventricular systolic function is normal. The right ventricular size is normal.  3. The mitral valve is grossly normal. Trivial mitral valve regurgitation.   4. Aortic valve hemodynamics not measured in this study. visualization reveals approximately moderate aortic stenosis. The aortic valve was not well visualized. Aortic valve regurgitation is not visualized. FINDINGS  Left Ventricle: Left ventricular ejection fraction, by estimation, is 65 to 70%. The left ventricle has normal function. The left ventricle has no regional wall motion abnormalities. The left ventricular internal cavity size was small. There is moderate  asymmetric left ventricular hypertrophy. Left ventricular diastolic function could not be evaluated. Right Ventricle: The right ventricular size is normal. No increase in right ventricular wall thickness. Right ventricular systolic function is normal. Left Atrium: Left atrial size was normal in size. Right Atrium: Right atrial size was not well visualized. Pericardium: There is no evidence of pericardial effusion. Mitral Valve: The mitral valve is grossly normal. Mild mitral annular calcification. Trivial mitral valve regurgitation. Tricuspid Valve: The tricuspid valve is grossly normal. Tricuspid valve regurgitation is mild. Aortic Valve: Aortic valve hemodynamics not measured in this study. visualization reveals approximately moderate aortic stenosis. The aortic valve was not well visualized. Aortic valve regurgitation is not visualized. Pulmonic Valve: The pulmonic valve was not well visualized. Pulmonic valve regurgitation is not visualized. Aorta: The aortic root is normal in size and structure. Venous: The inferior vena cava was not well visualized. IAS/Shunts: No atrial level shunt detected by color flow Doppler.  LEFT VENTRICLE PLAX 2D LVIDd:         3.62 cm LVIDs:         2.05 cm LV PW:         1.31 cm LV IVS:        1.55 cm LVOT diam:     2.00 cm LVOT Area:     3.14 cm  LEFT ATRIUM         Index LA diam:    3.30 cm 2.00 cm/m                        PULMONIC VALVE AORTA  PV Vmax:        0.80 m/s Ao Root diam: 2.60 cm PV Peak  grad:   2.6 mmHg                       RVOT Peak grad: 4 mmHg  TRICUSPID VALVE TR Peak grad:   43.0 mmHg TR Vmax:        328.00 cm/s  SHUNTS Systemic Diam: 2.00 cm Kate Sable MD Electronically signed by Kate Sable MD Signature Date/Time: 12/04/2019/10:31:52 AM    Final      Subjective: Pt c/o fatigue    Discharge Exam: Vitals:   12/06/19 0812 12/06/19 1208  BP: 136/62 (!) 125/52  Pulse: (!) 59 (!) 52  Resp: 18 20  Temp: 98.2 F (36.8 C) 98 F (36.7 C)  SpO2: 95% 97%   Vitals:   12/06/19 0009 12/06/19 0510 12/06/19 0812 12/06/19 1208  BP: 126/74 (!) 155/68 136/62 (!) 125/52  Pulse: 61 62 (!) 59 (!) 52  Resp: 17 17 18 20   Temp: 98.1 F (36.7 C) 98.3 F (36.8 C) 98.2 F (36.8 C) 98 F (36.7 C)  TempSrc:      SpO2: 94% 94% 95% 97%  Weight:      Height:        General: Pt is alert, awake, not in acute distress Cardiovascular:  S1/S2 +, no rubs, no gallops Respiratory: CTA bilaterally, no wheezing, no rhonchi Abdominal: Soft, NT, ND, bowel sounds + Extremities: no edema, no cyanosis    The results of significant diagnostics from this hospitalization (including imaging, microbiology, ancillary and laboratory) are listed below for reference.     Microbiology: Recent Results (from the past 240 hour(s))  SARS Coronavirus 2 by RT PCR (hospital order, performed in [facility] hospital lab) Nasopharyngeal Nasopharyngeal Swab     Status: None   Collection Time: 12/04/19  4:59 AM   Specimen: Nasopharyngeal Swab  Result Value Ref Range Status   SARS Coronavirus 2 NEGATIVE NEGATIVE Final    Comment: (NOTE) SARS-CoV-2 target nucleic acids are NOT DETECTED.  The SARS-CoV-2 RNA is generally detectable in upper and lower respiratory specimens during the acute phase of infection. The lowest concentration of SARS-CoV-2 viral copies this assay can detect is 250 copies / mL. A negative result does not preclude SARS-CoV-2 infection and should not be used as the sole  basis for treatment or other patient management decisions.  A negative result may occur with improper specimen collection / handling, submission of specimen other than nasopharyngeal swab, presence of viral mutation(s) within the areas targeted by this assay, and inadequate number of viral copies (<250 copies / mL). A negative result must be combined with clinical observations, patient history, and epidemiological information.  Fact Sheet for Patients:   StrictlyIdeas.no  Fact Sheet for Healthcare Providers: BankingDealers.co.za  This test is not yet approved or  cleared by the [place] FDA and has been authorized for detection and/or diagnosis of SARS-CoV-2 by FDA under an Emergency Use Authorization (EUA).  This EUA will remain in effect (meaning this test can be used) for the duration of the COVID-19 declaration under Section 564(b)(1) of the Act, 21 U.S.C. section 360bbb-3(b)(1), unless the authorization is terminated or revoked sooner.  Performed at [facility], [address] [place],  [address]      Labs: BNP (last 3 results) No results for input(s): BNP in the last 8760 hours. Basic Metabolic Panel: Recent Labs  Lab 12/03/19 2204  12/05/19 0457 12/06/19 0519  NA 142 139 140  K 3.9 2.7* 3.4*  CL 101 102 106  CO2 31 29 27   GLUCOSE 130* 108* 97  BUN 25* 15 15  CREATININE 1.48* 0.95 1.09*  CALCIUM 9.1 8.5* 8.4*   Liver Function Tests: No results for input(s): AST, ALT, ALKPHOS, BILITOT, PROT, ALBUMIN in the last 168 hours. No results for input(s): LIPASE, AMYLASE in the last 168 hours. No results for input(s): AMMONIA in the last 168 hours. CBC: Recent Labs  Lab 12/03/19 2204 12/05/19 0457 12/06/19 0519  WBC 11.9* 9.7 7.1  HGB 12.8 13.2 12.7  HCT 35.9* 36.1 34.9*  MCV 94.2 93.0 92.6  PLT 210 198 191   Cardiac Enzymes: No results for input(s): CKTOTAL, CKMB, CKMBINDEX, TROPONINI in  the last 168 hours. BNP: Invalid input(s): POCBNP CBG: Recent Labs  Lab 12/04/19 0859 12/05/19 0628 12/05/19 0818  GLUCAP 100* 103* 131*   D-Dimer No results for input(s): DDIMER in the last 72 hours. Hgb A1c No results for input(s): HGBA1C in the last 72 hours. Lipid Profile No results for input(s): CHOL, HDL, LDLCALC, TRIG, CHOLHDL, LDLDIRECT in the last 72 hours. Thyroid function studies No results for input(s): TSH, T4TOTAL, T3FREE, THYROIDAB in the last 72 hours.  Invalid input(s): FREET3 Anemia work up No results for input(s): VITAMINB12, FOLATE, FERRITIN, TIBC, IRON, RETICCTPCT in the last 72 hours. Urinalysis    Component Value Date/Time   COLORURINE STRAW (A) 12/04/2019 0521   APPEARANCEUR CLEAR (A) 12/04/2019 0521   APPEARANCEUR Hazy 11/12/2012 2042   LABSPEC 1.009 12/04/2019 0521   LABSPEC 1.026 11/12/2012 2042   PHURINE 7.0 12/04/2019 0521   GLUCOSEU 50 (A) 12/04/2019 0521   GLUCOSEU Negative 11/12/2012 2042   HGBUR NEGATIVE 12/04/2019 0521   BILIRUBINUR NEGATIVE 12/04/2019 0521   BILIRUBINUR Negative 11/12/2012 2042   KETONESUR NEGATIVE 12/04/2019 0521   PROTEINUR NEGATIVE 12/04/2019 0521   NITRITE NEGATIVE 12/04/2019 0521   LEUKOCYTESUR TRACE (A) 12/04/2019 0521   LEUKOCYTESUR 3+ 11/12/2012 2042   Sepsis Labs Invalid input(s): PROCALCITONIN,  WBC,  LACTICIDVEN Microbiology Recent Results (from the past 240 hour(s))  SARS Coronavirus 2 by RT PCR (hospital order, performed in [facility] hospital lab) Nasopharyngeal Nasopharyngeal Swab     Status: None   Collection Time: 12/04/19  4:59 AM   Specimen: Nasopharyngeal Swab  Result Value Ref Range Status   SARS Coronavirus 2 NEGATIVE NEGATIVE Final    Comment: (NOTE) SARS-CoV-2 target nucleic acids are NOT DETECTED.  The SARS-CoV-2 RNA is generally detectable in upper and lower respiratory specimens during the acute phase of infection. The lowest concentration of SARS-CoV-2 viral copies this assay can  detect is 250 copies / mL. A negative result does not preclude SARS-CoV-2 infection and should not be used as the sole basis for treatment or other patient management decisions.  A negative result may occur with improper specimen collection / handling, submission of specimen other than nasopharyngeal swab, presence of viral mutation(s) within the areas targeted by this assay, and inadequate number of viral copies (<250 copies / mL). A negative result must be combined with clinical observations, patient history, and epidemiological information.  Fact Sheet for Patients:   StrictlyIdeas.no  Fact Sheet for Healthcare Providers: BankingDealers.co.za  This test is not yet approved or  cleared by the [place] FDA and has been authorized for detection and/or diagnosis of SARS-CoV-2 by FDA under an Emergency Use Authorization (EUA).  This EUA will remain in effect (meaning this test  can be used) for the duration of the COVID-19 declaration under Section 564(b)(1) of the Act, 21 U.S.C. section 360bbb-3(b)(1), unless the authorization is terminated or revoked sooner.  Performed at [facility], [address] [place],  [address]      Time coordinating discharge: Over 30 minutes  SIGNED:   Wyvonnia Dusky, MD  [facility] 12/06/2019, 1:38 PM Pager   If 7PM-7AM, please contact night-coverage [email or web address] Password [email or web address]

## 2019-12-06 NOTE — NC FL2 (Signed)
[place] LEVEL OF CARE SCREENING TOOL     IDENTIFICATION  Patient Name: Kristy Hardy Birthdate: Jan 10, 1925 Sex: female Admission Date (Current Location): 12/04/2019  [place] and [place] Number:  Engineering geologist and Address:  [facility], [address] [place], [place] [address]      Provider Number: [id number]  Attending Physician Name and Address:  Wyvonnia Dusky, MD  Relative Name and Phone Number:  Annelle Behrendt (daughter) [phone]    Current Level of Care: Hospital Recommended Level of Care: [place] Prior Approval Number:    Date Approved/Denied:   PASRR Number: [id number]  Discharge Plan: SNF    Current Diagnoses: Patient Active Problem List   Diagnosis Date Noted  . Syncope and collapse 12/04/2019  . Nausea and vomiting 12/04/2019  . Vertigo 12/04/2019  . Seizure disorder ([place]) 12/04/2019  . COPD with chronic bronchitis ([place]) 12/04/2019  . Acute renal failure superimposed on chronic kidney disease ([place]) 12/04/2019  . Syncope 12/04/2019  . GI bleed 08/25/2017  . Lumbar compression fracture ([place]) 08/16/2017  . Malignant neoplasm of upper-inner quadrant of right breast in female, estrogen receptor positive ([place]) 04/01/2016  . History of breast cancer 08/10/2015  . Iron deficiency anemia due to chronic blood loss 02/03/2015  . Carcinoma of right breast ([place]) 10/12/2014  . Malignant neoplasm of hepatic flexure of colon ([place]) 09/29/2014  . Aortic valve stenosis- mild 03/31/2014  . Benign neoplasm of colon 08/28/2013  . HLD (hyperlipidemia) 08/28/2013  . Hypercholesterolemia without hypertriglyceridemia 08/28/2013    Orientation RESPIRATION BLADDER Height & Weight     Self  Normal Incontinent Weight: 52.1 kg Height:  5\' 5"  (165.1 cm)  BEHAVIORAL SYMPTOMS/MOOD NEUROLOGICAL BOWEL NUTRITION STATUS      Continent Diet (see discharge summary)  AMBULATORY STATUS COMMUNICATION OF  NEEDS Skin   Limited Assist Verbally Bruising (both arms)                       Personal Care Assistance Level of Assistance  Bathing, Feeding, Dressing Bathing Assistance: Limited assistance Feeding assistance: Limited assistance Dressing Assistance: Limited assistance     Functional Limitations Info  Hearing   Hearing Info: Impaired      SPECIAL CARE FACTORS FREQUENCY  PT (By licensed PT), OT (By licensed OT)     PT Frequency: 5 times per week OT Frequency: 5 times per week            Contractures Contractures Info: Not present    Additional Factors Info  Code Status, Allergies Code Status Info: Full Allergies Info: amoxicillin, sulfa           Current Medications (12/06/2019):  This is the current hospital active medication list Current Facility-Administered Medications  Medication Dose Route Frequency Provider Last Rate Last Admin  . acetaminophen (TYLENOL) tablet 650 mg  650 mg Oral Q6H PRN Athena Masse, MD   650 mg at 12/05/19 2100   Or  . acetaminophen (TYLENOL) suppository 650 mg  650 mg Rectal Q6H PRN Athena Masse, MD      . amiodarone (PACERONE) tablet 200 mg  200 mg Oral BID Wyvonnia Dusky, MD   200 mg at 12/06/19 1149  . amLODipine (NORVASC) tablet 2.5 mg  2.5 mg Oral Daily Wyvonnia Dusky, MD   2.5 mg at 12/06/19 1144  . [START ON 12/07/2019] enoxaparin (LOVENOX) injection 30 mg  30 mg Subcutaneous Q24H Rocky Morel, RPH      .  hydrALAZINE (APRESOLINE) tablet 50 mg  50 mg Oral Q6H PRN Wyvonnia Dusky, MD      . letrozole [facility]) tablet 2.5 mg  2.5 mg Oral Daily Wyvonnia Dusky, MD   2.5 mg at 12/06/19 1144  . levETIRAcetam (KEPPRA) tablet 1,000 mg  1,000 mg Oral Daily Wyvonnia Dusky, MD   1,000 mg at 12/06/19 1151  . meclizine (ANTIVERT) tablet 25 mg  25 mg Oral TID Wyvonnia Dusky, MD   25 mg at 12/06/19 1149  . metoprolol succinate (TOPROL-XL) 24 hr tablet 25 mg  25 mg Oral Daily Wyvonnia Dusky, MD   25 mg  at 12/06/19 1149  . ondansetron (ZOFRAN) tablet 4 mg  4 mg Oral Q6H PRN Athena Masse, MD       Or  . ondansetron [facility]) injection 4 mg  4 mg Intravenous Q6H PRN Athena Masse, MD      . simvastatin (ZOCOR) tablet 20 mg  20 mg Oral q1800 Wyvonnia Dusky, MD      . sodium chloride flush (NS) 0.9 % injection 3 mL  3 mL Intravenous Q12H Athena Masse, MD   3 mL at 12/06/19 1152     Discharge Medications: Please see discharge summary for a list of discharge medications.  Relevant Imaging Results:  Relevant Lab Results:   Additional Information  [id number]  Shelbie Hutching, RN

## 2019-12-06 NOTE — Progress Notes (Signed)
Anticoagulation monitoring(Lovenox):  86 yo  female ordered Lovenox 40 mg Q24h  Filed Weights   12/03/19 2155 12/05/19 0502  Weight: 59 kg (130 lb) 52.1 kg (114 lb 12.8 oz)   Body mass index is 19.1 kg/m.    Lab Results  Component Value Date   CREATININE 1.09 (H) 12/06/2019   CREATININE 0.95 12/05/2019   CREATININE 1.48 (H) 12/03/2019   Estimated Creatinine Clearance: 26.5 mL/min (A) (by C-G formula based on SCr of 1.09 mg/dL (H)). Hemoglobin & Hematocrit     Component Value Date/Time   HGB 12.7 12/06/2019 0519   HGB 13.1 09/01/2014 1506   HCT 34.9 (L) 12/06/2019 0519   HCT 38.8 09/01/2014 1506     Per Protocol for Patient with estCrcl < 30 ml/min and BMI < 40, will transition to Lovenox 30 mg Q24h.

## 2019-12-06 NOTE — Progress Notes (Signed)
PT  Note  Patient Details Name: Kristy Hardy MRN: [id number] DOB: 1924-11-05  Per discussion with CM, pt will not have 24 hour assistance/supervision  in home environment. Therefore pt will benefit from going to SNF to address deficits and assist pt to PLOF. Pt does have strength, balance,endurance, and safe functional mobility deficits.         Willette Pa 12/06/2019, 2:16 PM

## 2019-12-06 NOTE — Care Management Important Message (Signed)
Important Message  Patient Details  Name: Kristy Hardy MRN: [id number] Date of Birth: 1924-08-21   Medicare Important Message Given:  N/A - LOS <3 / Initial given by admissions     Juliann Pulse A Henna Derderian 12/06/2019, 7:41 AM

## 2019-12-06 NOTE — Progress Notes (Signed)
PROGRESS NOTE    Kristy Hardy  HFW:[id number] DOB: 04-Sep-1924 DOA: 12/04/2019 PCP: [name], MD   Assessment & Plan:   Principal Problem:   Syncope and collapse Active Problems:   Aortic valve stenosis- mild   Nausea and vomiting   Vertigo   Seizure disorder (HCC)   COPD with chronic bronchitis (HCC)   Acute renal failure superimposed on chronic kidney disease (HCC)   Syncope   Syncope: etiology unclear, possible dehydration vs orthostatic hypotension. CT brain with no acute intracranial findings. MRI shows no acute intracranial abnormalities noted. Continue on tele   Generalized weakness: PT recs home health    Altered mental status: etiology unclear, possible delirium vs dementia. Has an outpatient appt w/ Dr. Manuella Ghazi, neuro for evaluation of dementia   Hypokalemia: KCl repleted again. Will continue to monitor   Aortic valve stenosis: mild. Followed by Dr. Ubaldo Glassing  Nausea and vomiting: possibly related to vertigo. Resolved  AKI on CKDIIIb: stable. Will continue to monitor   Acute on chronic back pain: with history of kyphoplasty. Continue on tramadol. Follows with Dr. Rudene Christians. Back XRs shows remote T12, L1 compression fractures w/ advanced lumbar spine degeneration   Seizure disorder: unknown type. Continue keppra  COPD with chronic bronchitis: w/o exacerbation. DuoNebs prn   DVT prophylaxis: lovenox  Code Status: full Family Communication: discussed pt's care w/ pt's daughter, Joycelyn Schmid, and answered her questions  Disposition Plan: likely d/c home w/ home health   Status is: Inpatient  Remains inpatient appropriate because: unsafe d/c plan   Dispo: The patient is from: Home              Anticipated d/c is to: Home w/ home health               Anticipated d/c date is: 1 day              Patient currently is not medically stable to d/c.   Consultants:      Procedures:   Antimicrobials:   Subjective: Pt c/o malaise. Pt is oriented to  self only  Objective: Vitals:   12/05/19 1635 12/05/19 1950 12/06/19 0009 12/06/19 0510  BP: (!) 122/53 (!) 151/70 126/74 (!) 155/68  Pulse: (!) 59 65 61 62  Resp: 16 16 17 17   Temp: 98.7 F (37.1 C) 98.3 F (36.8 C) 98.1 F (36.7 C) 98.3 F (36.8 C)  TempSrc:      SpO2: 98% 94% 94% 94%  Weight:      Height:        Intake/Output Summary (Last 24 hours) at 12/06/2019 0721 Last data filed at 12/05/2019 1636 Gross per 24 hour  Intake --  Output 800 ml  Net -800 ml   Filed Weights   12/03/19 2155 12/05/19 0502  Weight: 59 kg 52.1 kg    Examination:  General exam: Appears calm and comfortable  Respiratory system: Clear to auscultation. No wheezes Cardiovascular system: S1 & S2 +. No rubs, gallops or clicks. Gastrointestinal system: Abdomen is nondistended, soft and nontender. Hypoactive bowel sounds heard. Central nervous system: Alert and oriented to self only. Moves all 4 extremities  Psychiatry: Judgement and insight appear abnormal. Mood & affect appropriate.     Data Reviewed: I have personally reviewed following labs and imaging studies  CBC: Recent Labs  Lab 12/03/19 2204 12/05/19 0457 12/06/19 0519  WBC 11.9* 9.7 7.1  HGB 12.8 13.2 12.7  HCT 35.9* 36.1 34.9*  MCV 94.2 93.0 92.6  PLT 210 198  341   Basic Metabolic Panel: Recent Labs  Lab 12/03/19 2204 12/05/19 0457 12/06/19 0519  NA 142 139 140  K 3.9 2.7* 3.4*  CL 101 102 106  CO2 31 29 27   GLUCOSE 130* 108* 97  BUN 25* 15 15  CREATININE 1.48* 0.95 1.09*  CALCIUM 9.1 8.5* 8.4*   GFR: Estimated Creatinine Clearance: 26.5 mL/min (A) (by C-G formula based on SCr of 1.09 mg/dL (H)). Liver Function Tests: No results for input(s): AST, ALT, ALKPHOS, BILITOT, PROT, ALBUMIN in the last 168 hours. No results for input(s): LIPASE, AMYLASE in the last 168 hours. No results for input(s): AMMONIA in the last 168 hours. Coagulation Profile: No results for input(s): INR, PROTIME in the last 168  hours. Cardiac Enzymes: No results for input(s): CKTOTAL, CKMB, CKMBINDEX, TROPONINI in the last 168 hours. BNP (last 3 results) No results for input(s): PROBNP in the last 8760 hours. HbA1C: No results for input(s): HGBA1C in the last 72 hours. CBG: Recent Labs  Lab 12/04/19 0859 12/05/19 0628 12/05/19 0818  GLUCAP 100* 103* 131*   Lipid Profile: No results for input(s): CHOL, HDL, LDLCALC, TRIG, CHOLHDL, LDLDIRECT in the last 72 hours. Thyroid Function Tests: No results for input(s): TSH, T4TOTAL, FREET4, T3FREE, THYROIDAB in the last 72 hours. Anemia Panel: No results for input(s): VITAMINB12, FOLATE, FERRITIN, TIBC, IRON, RETICCTPCT in the last 72 hours. Sepsis Labs: No results for input(s): PROCALCITON, LATICACIDVEN in the last 168 hours.  Recent Results (from the past 240 hour(s))  SARS Coronavirus 2 by RT PCR (hospital order, performed in [facility] hospital lab) Nasopharyngeal Nasopharyngeal Swab     Status: None   Collection Time: 12/04/19  4:59 AM   Specimen: Nasopharyngeal Swab  Result Value Ref Range Status   SARS Coronavirus 2 NEGATIVE NEGATIVE Final    Comment: (NOTE) SARS-CoV-2 target nucleic acids are NOT DETECTED.  The SARS-CoV-2 RNA is generally detectable in upper and lower respiratory specimens during the acute phase of infection. The lowest concentration of SARS-CoV-2 viral copies this assay can detect is 250 copies / mL. A negative result does not preclude SARS-CoV-2 infection and should not be used as the sole basis for treatment or other patient management decisions.  A negative result may occur with improper specimen collection / handling, submission of specimen other than nasopharyngeal swab, presence of viral mutation(s) within the areas targeted by this assay, and inadequate number of viral copies (<250 copies / mL). A negative result must be combined with clinical observations, patient history, and epidemiological information.  Fact Sheet for  Patients:   StrictlyIdeas.no  Fact Sheet for Healthcare Providers: BankingDealers.co.za  This test is not yet approved or  cleared by the [place] FDA and has been authorized for detection and/or diagnosis of SARS-CoV-2 by FDA under an Emergency Use Authorization (EUA).  This EUA will remain in effect (meaning this test can be used) for the duration of the COVID-19 declaration under Section 564(b)(1) of the Act, 21 U.S.C. section 360bbb-3(b)(1), unless the authorization is terminated or revoked sooner.  Performed at [facility], [address] [place], [place] [address]          Radiology Studies: ECHOCARDIOGRAM COMPLETE  Result Date: 12/04/2019    ECHOCARDIOGRAM REPORT   Patient Name:   Larysa CORNELIA Steiner Date of Exam: 12/04/2019 Medical Rec #:  [id number]               Height:       65.0 in Accession #:  [id number]              Weight:       130.0 lb Date of Birth:  1924/09/14               BSA:          1.647 m Patient Age:    75 years                BP:           252/47 mmHg Patient Gender: F                       HR:           57 bpm. Exam Location:  [facility] Procedure: 2D Echo, Cardiac Doppler and Color Doppler Indications:     syncope 780.2  History:         Patient has no prior history of Echocardiogram examinations.                  COPD. Breast cancer, heart valve problem.  Sonographer:     Sherrie Sport RDCS (AE) Referring Phys:  [id number] Athena Masse Diagnosing Phys: Kate Sable MD  Sonographer Comments: No apical window. IMPRESSIONS  1. Left ventricular ejection fraction, by estimation, is 65 to 70%. The left ventricle has normal function. The left ventricle has no regional wall motion abnormalities. There is moderate asymmetric left ventricular hypertrophy. Left ventricular diastolic function could not be evaluated.  2. Right ventricular systolic function is normal. The right ventricular size is normal.  3.  The mitral valve is grossly normal. Trivial mitral valve regurgitation.  4. Aortic valve hemodynamics not measured in this study. visualization reveals approximately moderate aortic stenosis. The aortic valve was not well visualized. Aortic valve regurgitation is not visualized. FINDINGS  Left Ventricle: Left ventricular ejection fraction, by estimation, is 65 to 70%. The left ventricle has normal function. The left ventricle has no regional wall motion abnormalities. The left ventricular internal cavity size was small. There is moderate  asymmetric left ventricular hypertrophy. Left ventricular diastolic function could not be evaluated. Right Ventricle: The right ventricular size is normal. No increase in right ventricular wall thickness. Right ventricular systolic function is normal. Left Atrium: Left atrial size was normal in size. Right Atrium: Right atrial size was not well visualized. Pericardium: There is no evidence of pericardial effusion. Mitral Valve: The mitral valve is grossly normal. Mild mitral annular calcification. Trivial mitral valve regurgitation. Tricuspid Valve: The tricuspid valve is grossly normal. Tricuspid valve regurgitation is mild. Aortic Valve: Aortic valve hemodynamics not measured in this study. visualization reveals approximately moderate aortic stenosis. The aortic valve was not well visualized. Aortic valve regurgitation is not visualized. Pulmonic Valve: The pulmonic valve was not well visualized. Pulmonic valve regurgitation is not visualized. Aorta: The aortic root is normal in size and structure. Venous: The inferior vena cava was not well visualized. IAS/Shunts: No atrial level shunt detected by color flow Doppler.  LEFT VENTRICLE PLAX 2D LVIDd:         3.62 cm LVIDs:         2.05 cm LV PW:         1.31 cm LV IVS:        1.55 cm LVOT diam:     2.00 cm LVOT Area:     3.14 cm  LEFT ATRIUM         Index LA diam:    3.30  cm 2.00 cm/m                        PULMONIC VALVE AORTA                  PV Vmax:        0.80 m/s Ao Root diam: 2.60 cm PV Peak grad:   2.6 mmHg                       RVOT Peak grad: 4 mmHg  TRICUSPID VALVE TR Peak grad:   43.0 mmHg TR Vmax:        328.00 cm/s  SHUNTS Systemic Diam: 2.00 cm Kate Sable MD Electronically signed by Kate Sable MD Signature Date/Time: 12/04/2019/10:31:52 AM    Final         Scheduled Meds:  amiodarone  200 mg Oral BID   amLODipine  2.5 mg Oral Daily   enoxaparin (LOVENOX) injection  40 mg Subcutaneous Q24H   letrozole  2.5 mg Oral Daily   levETIRAcetam  1,000 mg Oral Daily   meclizine  25 mg Oral TID   metoprolol succinate  25 mg Oral Daily   potassium chloride  20 mEq Oral Once   simvastatin  20 mg Oral q1800   sodium chloride flush  3 mL Intravenous Q12H   Continuous Infusions:  sodium chloride 75 mL/hr at 12/05/19 2103     LOS: 2 days    Time spent: 30 mins    Wyvonnia Dusky, MD [facility] Pager [phone]  If 7PM-7AM, please contact night-coverage [email or web address] 12/06/2019, 7:21 AM

## 2019-12-06 NOTE — Progress Notes (Signed)
Patient received discharge instructions

## 2019-12-11 ENCOUNTER — Other Ambulatory Visit: Payer: Self-pay | Admitting: Sports Medicine

## 2019-12-11 ENCOUNTER — Other Ambulatory Visit (HOSPITAL_COMMUNITY): Payer: Self-pay | Admitting: Sports Medicine

## 2019-12-11 DIAGNOSIS — S22070A Wedge compression fracture of T9-T10 vertebra, initial encounter for closed fracture: Secondary | ICD-10-CM

## 2019-12-11 DIAGNOSIS — M545 Low back pain, unspecified: Secondary | ICD-10-CM

## 2019-12-13 ENCOUNTER — Ambulatory Visit
Admission: RE | Admit: 2019-12-13 | Discharge: 2019-12-13 | Disposition: A | Discharge status: Home / Self Care | Payer: Medicare Other | Source: Ambulatory Visit | Attending: Sports Medicine | Admitting: Sports Medicine

## 2019-12-13 ENCOUNTER — Other Ambulatory Visit: Payer: Self-pay

## 2019-12-13 DIAGNOSIS — M545 Low back pain, unspecified: Secondary | ICD-10-CM

## 2019-12-13 DIAGNOSIS — S22070A Wedge compression fracture of T9-T10 vertebra, initial encounter for closed fracture: Secondary | ICD-10-CM | POA: Diagnosis present

## 2019-12-24 ENCOUNTER — Telehealth: Payer: Self-pay | Admitting: *Deleted

## 2019-12-24 DIAGNOSIS — D500 Iron deficiency anemia secondary to blood loss (chronic): Secondary | ICD-10-CM

## 2019-12-24 DIAGNOSIS — Z170 Estrogen receptor positive status [ER+]: Secondary | ICD-10-CM

## 2019-12-24 NOTE — Telephone Encounter (Signed)
Daughter called and requested that patient see Dr. B asap. Dr. Vinetta Bergamo requested pt to come back to the cancer center to evaluate abn imaging on 12/13/2019 MR thoracic. Per daughter, patient needs additional CT imaging. See Impression below   "Probable mass at the right renal hilum, incompletely visualized but worrisome for malignancy. Further evaluation with abdominopelvic CT with contrast recommended."  Daughter is also requesting pt to be set up for IV iron as well. Patient currently residing at [facility]. Please advise.  Nira Conn

## 2019-12-25 NOTE — Telephone Encounter (Signed)
C- Please schedule in 1 week- MD; cbc/Cmp/LDH; iron studies; ferritin; possible venofer- Dr.B

## 2019-12-30 ENCOUNTER — Other Ambulatory Visit: Payer: Self-pay | Admitting: Family Medicine

## 2019-12-31 ENCOUNTER — Other Ambulatory Visit: Payer: Self-pay | Admitting: Family Medicine

## 2019-12-31 ENCOUNTER — Ambulatory Visit: Payer: Medicare Other

## 2019-12-31 DIAGNOSIS — R9389 Abnormal findings on diagnostic imaging of other specified body structures: Secondary | ICD-10-CM

## 2019-12-31 DIAGNOSIS — N2889 Other specified disorders of kidney and ureter: Secondary | ICD-10-CM

## 2020-01-01 ENCOUNTER — Other Ambulatory Visit: Payer: Self-pay | Admitting: Nephrology

## 2020-01-01 ENCOUNTER — Ambulatory Visit
Admission: RE | Admit: 2020-01-01 | Discharge: 2020-01-01 | Disposition: A | Discharge status: Home / Self Care | Payer: Medicare Other | Source: Ambulatory Visit | Attending: Family Medicine | Admitting: Family Medicine

## 2020-01-01 ENCOUNTER — Other Ambulatory Visit: Payer: Self-pay

## 2020-01-01 DIAGNOSIS — N2889 Other specified disorders of kidney and ureter: Secondary | ICD-10-CM | POA: Insufficient documentation

## 2020-01-01 DIAGNOSIS — R9389 Abnormal findings on diagnostic imaging of other specified body structures: Secondary | ICD-10-CM | POA: Insufficient documentation

## 2020-01-01 MED ORDER — IOHEXOL 300 MG/ML  SOLN
75.0000 mL | Freq: Once | INTRAMUSCULAR | Status: AC | PRN
  Administered 2020-01-01: 75 mL via INTRAVENOUS

## 2020-01-07 ENCOUNTER — Inpatient Hospital Stay: Payer: Medicare Other

## 2020-01-07 ENCOUNTER — Inpatient Hospital Stay: Payer: Medicare Other | Admitting: Internal Medicine

## 2020-01-08 ENCOUNTER — Ambulatory Visit: Payer: Medicare Other

## 2020-01-20 ENCOUNTER — Ambulatory Visit: Payer: Medicare Other

## 2020-02-21 DEATH — deceased
